# Patient Record
Sex: Female | Born: 1960 | Race: White | Hispanic: No | Smoking: Former smoker
Health system: Southern US, Community
[De-identification: ages and names within clinical notes are randomized; demographics above are authoritative.]

## PROBLEM LIST (undated history)

## (undated) DIAGNOSIS — E785 Hyperlipidemia, unspecified: Secondary | ICD-10-CM

## (undated) DIAGNOSIS — N289 Disorder of kidney and ureter, unspecified: Secondary | ICD-10-CM

## (undated) DIAGNOSIS — E119 Type 2 diabetes mellitus without complications: Secondary | ICD-10-CM

## (undated) DIAGNOSIS — I10 Essential (primary) hypertension: Secondary | ICD-10-CM

## (undated) DIAGNOSIS — I509 Heart failure, unspecified: Secondary | ICD-10-CM

## (undated) DIAGNOSIS — I219 Acute myocardial infarction, unspecified: Secondary | ICD-10-CM

## (undated) HISTORY — PX: TONSILLECTOMY: SUR1361

## (undated) HISTORY — DX: Essential (primary) hypertension: I10

## (undated) HISTORY — DX: Heart failure, unspecified: I50.9

## (undated) HISTORY — DX: Type 2 diabetes mellitus without complications: E11.9

## (undated) HISTORY — DX: Disorder of kidney and ureter, unspecified: N28.9

## (undated) HISTORY — DX: Acute myocardial infarction, unspecified: I21.9

## (undated) HISTORY — DX: Hyperlipidemia, unspecified: E78.5

## (undated) NOTE — *Deleted (*Deleted)
PROGRESS NOTE    ANEESHA HOLLORAN  ZOX:[id number] DOB: 1960-08-30 DOA: 11/15/2019 PCP: Patient, No Pcp Per    Chief Complaint  Patient presents with  . Fever  . DM ulcer    Brief Narrative: 74 year old lady with prior history of coronary artery disease s/p CABG, diabetes mellitus, hypertension, hyperlipidemia presents to ED for generalized weakness, fevers and chills patient reports that she lives at section 8 housing in [place], apparently lost her housing and moved to [place] she was currently living in a motel prior to admission.  On arrival to ED she was found to have a left great toe wound progressively getting worse with increased pain and foul-smelling drainage.  Imaging of the toe showed osteomyelitis.  Orthopedic surgery comes consulted underwent toe amputation on 11/17/2019.   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  Intraoperative cultures showing MSSA. Blood cultures have been negative.  Complete the course of oral antibiotics.    Type 2 diabetes mellitus CBG (last 3)  Recent Labs    11/24/19 2143 11/25/19 0716 11/25/19 1210  GLUCAP 122* 199* 186*   CBGs are well controlled.  Continue with Lantus and sliding scale insulin.  No changes.    Pseudohyponatremia probably secondary to elevated CBGs.    Hyperlipidemia Continue with the Lipitor  Hypertension Continue with metoprolol   Presumed chronic diastolic heart failure Patient appears to be euvolemic.      Stage IIIa CKD Creatinine appears to be at baseline.    Moderate malnutrition Nutrition consulted.  Hypomagnesemia Replaced    Disposition Patient has out-of-state Medicaid and currently no long-term living situation at this time PT evaluation recommending SNF.  Currently no safe discharge/ disposition found.   TOC on board and looking for placement.  DVT prophylaxis: (Lovenox Code Status: DNR Family Communication: (None at bedside Disposition:   Status is: Inpatient  Remains inpatient appropriate because:Unsafe d/c plan   Dispo: The patient is from: Home              Anticipated d/c is to: Pending              Anticipated d/c date is: 1 day              Patient currently is medically stable to d/c.       Consultants:   Orthopedics   Procedures:    Antimicrobials: none.    Subjective: No new complaints  Objective: Vitals:   11/24/19 1358 11/24/19 2146 11/25/19 0527 11/25/19 1212  BP: 133/75 (!) 158/93 122/82 107/66  Pulse: 78 83 76 64  Resp: 20 16 16 16   Temp: 98.1 F (36.7 C)  98.4 F (36.9 C) 97.9 F (36.6 C)  TempSrc: Oral  Oral Oral  SpO2: 100% 100% 100% 96%  Weight:      Height:        Intake/Output Summary (Last 24 hours) at 11/25/2019 1535 Last data filed at 11/25/2019 1000 Gross per 24 hour  Intake 600 ml  Output -  Net 600  ml   Filed Weights   11/15/19 1755 11/16/19 0025  Weight: 81.6 kg 81.7 kg    Examination:  General exam: Appears calm and comfortable  Respiratory system: Clear to auscultation. Respiratory effort normal. Cardiovascular system: S1 & S2 heard, RRR. No JVD, No pedal edema. Gastrointestinal system: Abdomen is nondistended, soft and nontender.t. Normal bowel sounds heard. Central nervous system: Alert and oriented. No focal neurological deficits. Extremities: RIGHT TOE AMPUTATION, BANDAGED.  Skin: No rashes, lesions or ulcers Psychiatry:  Mood & affect appropriate.     Data Reviewed: I have personally  reviewed following labs and imaging studies  CBC: Recent Labs  Lab 11/19/19 0333 11/21/19 0331 11/22/19 0343  WBC 11.0* 10.7* 11.6*  HGB 9.5* 9.3* 9.8*  HCT 29.0* 29.2* 31.0*  MCV 95.7 96.7 100.0  PLT 357 362 405*    Basic Metabolic Panel: Recent Labs  Lab 11/19/19 0333 11/21/19 0331 11/22/19 0343  NA 133* 135 132*  K 5.1 4.6 4.7  CL 96* 98 97*  CO2 29 29 26   GLUCOSE 223* 172* 212*  BUN 25* 22 21  CREATININE 1.10* 1.02* 1.06*  CALCIUM 8.6* 9.2 9.1  MG  --  1.6* 1.8    GFR: Estimated Creatinine Clearance: 55.2 mL/min (A) (by C-G formula based on SCr of 1.06 mg/dL (H)).  Liver Function Tests: Recent Labs  Lab 11/19/19 0333 11/21/19 0331 11/22/19 0343  AST 14* 16 20  ALT 11 9 7   ALKPHOS 78 59 61  BILITOT 0.3 0.4 0.4  PROT 5.9* 6.3* 6.5  ALBUMIN 2.3* 2.4* 2.9*    CBG: Recent Labs  Lab 11/24/19 1157 11/24/19 1733 11/24/19 2143 11/25/19 0716 11/25/19 1210  GLUCAP 253* 147* 122* 199* 186*     Recent Results (from the past 240 hour(s))  Respiratory Panel by RT PCR (Flu A&B, Covid) - Nasopharyngeal Swab     Status: None   Collection Time: 11/15/19  4:33 PM   Specimen: Nasopharyngeal Swab  Result Value Ref Range Status   SARS Coronavirus 2 by RT PCR NEGATIVE NEGATIVE Final    Comment: (NOTE) SARS-CoV-2 target nucleic acids are NOT DETECTED.  The SARS-CoV-2 RNA is generally detectable in upper respiratoy specimens during the acute phase of infection. The lowest concentration of SARS-CoV-2 viral copies this assay can detect is 131 copies/mL. A negative result does not preclude SARS-Cov-2 infection and should not be used as the sole basis for treatment or other patient management decisions. A negative result may occur with  improper specimen collection/handling, submission of specimen other than nasopharyngeal swab, presence of viral mutation(s) within the areas targeted by this assay, and inadequate number of viral copies (<131 copies/mL). A negative  result must be combined with clinical observations, patient history, and epidemiological information. The expected result is Negative.  Fact Sheet for Patients:  https://www.moore.com/  Fact Sheet for Healthcare Providers:  https://www.young.biz/  This test is no t yet approved or cleared by the [place] FDA and  has been authorized for detection and/or diagnosis of SARS-CoV-2 by FDA under an Emergency Use Authorization (EUA). This EUA will remain  in effect (meaning this test can be used) for the duration of the COVID-19 declaration under Section 564(b)(1) of the Act, 21 U.S.C. section 360bbb-3(b)(1), unless the authorization is terminated or revoked sooner.     Influenza A by PCR NEGATIVE NEGATIVE Final   Influenza B by PCR NEGATIVE NEGATIVE Final    Comment: (NOTE) The Xpert Xpress SARS-CoV-2/FLU/RSV assay is intended as an aid in  the diagnosis of influenza from Nasopharyngeal swab specimens and  should not be used as a sole basis for treatment. Nasal washings and  aspirates are unacceptable for Xpert Xpress SARS-CoV-2/FLU/RSV  testing.  Fact Sheet for Patients: https://www.moore.com/  Fact Sheet for Healthcare Providers: https://www.young.biz/  This test is not yet approved or cleared by the [place] FDA and  has been authorized for detection and/or diagnosis of SARS-CoV-2 by  FDA under an Emergency Use Authorization (EUA). This EUA will remain  in effect (meaning this test can be used) for the duration of the  Covid-19 declaration under Section 564(b)(1) of the Act, 21  U.S.C. section 360bbb-3(b)(1), unless the authorization is  terminated or revoked. Performed at [facility], [address], [place], [place] [address]   Culture, blood (routine x 2)     Status: None   Collection Time: 11/15/19  4:45 PM   Specimen: BLOOD  Result Value Ref Range Status    Specimen Description   Final    BLOOD SITE NOT SPECIFIED Performed at [facility], [address], [place], [place] [address]    Special Requests   Final    BOTTLES DRAWN AEROBIC AND ANAEROBIC Blood Culture adequate volume Performed at [facility], [address], [place], [place] [address]    Culture   Final    NO GROWTH 5 DAYS Performed at [facility], [address] [place], [place] [address]    Report Status 11/20/2019 FINAL  Final  Wound or Superficial Culture     Status: None   Collection Time: 11/15/19  4:56 PM   Specimen: Toe; Wound  Result Value Ref Range Status   Specimen Description   Final    TOE LEFT Performed at [facility], [address], [place], [place] [address]    Special Requests   Final    NONE Performed at [facility], [address] [place], [place] [address]    Gram Stain   Final    RARE WBC PRESENT, PREDOMINANTLY PMN FEW GRAM POSITIVE COCCI Performed at [facility], [address] [place], [place] [address]    Culture ABUNDANT STAPHYLOCOCCUS AUREUS  Final   Report Status 11/18/2019 FINAL  Final   Organism ID, Bacteria STAPHYLOCOCCUS AUREUS  Final      Susceptibility   Staphylococcus aureus - MIC*    CIPROFLOXACIN <=0.5 SENSITIVE Sensitive     ERYTHROMYCIN <=0.25 SENSITIVE Sensitive     GENTAMICIN <=0.5 SENSITIVE Sensitive     OXACILLIN <=0.25 SENSITIVE Sensitive     TETRACYCLINE <=1 SENSITIVE Sensitive     VANCOMYCIN <=0.5 SENSITIVE Sensitive     TRIMETH/SULFA <=10 SENSITIVE Sensitive     CLINDAMYCIN >=8 RESISTANT Resistant     RIFAMPIN <=0.5 SENSITIVE Sensitive     Inducible Clindamycin NEGATIVE Sensitive     * ABUNDANT STAPHYLOCOCCUS AUREUS  Aerobic/Anaerobic Culture (surgical/deep wound)     Status: None   Collection Time: 11/17/19  3:39 PM   Specimen: Soft Tissue, Other  Result Value Ref Range Status   Specimen Description   Final    TOE LEFT GREAT TOE  Performed at [facility], [address], [place], [place] [address]    Special Requests   Final    NONE Performed at [facility], [address] [place], [place] [address]    Gram Stain   Final    FEW WBC PRESENT, PREDOMINANTLY PMN RARE  GRAM POSITIVE COCCI    Culture   Final    FEW STAPHYLOCOCCUS AUREUS NO ANAEROBES ISOLATED Performed at [facility], [address] [place], [place] [address]    Report Status 11/22/2019 FINAL  Final   Organism ID, Bacteria STAPHYLOCOCCUS AUREUS  Final      Susceptibility   Staphylococcus aureus - MIC*    CIPROFLOXACIN <=0.5 SENSITIVE Sensitive     ERYTHROMYCIN <=0.25 SENSITIVE Sensitive     GENTAMICIN <=0.5 SENSITIVE Sensitive     OXACILLIN <=0.25 SENSITIVE Sensitive     TETRACYCLINE <=1 SENSITIVE Sensitive     VANCOMYCIN <=0.5 SENSITIVE Sensitive     TRIMETH/SULFA <=10 SENSITIVE Sensitive     CLINDAMYCIN >=8 RESISTANT Resistant     RIFAMPIN <=0.5 SENSITIVE Sensitive     Inducible Clindamycin NEGATIVE Sensitive     * FEW STAPHYLOCOCCUS AUREUS  Respiratory Panel by RT PCR (Flu A&B, Covid) - Nasopharyngeal Swab     Status: None   Collection Time: 11/22/19  6:05 PM   Specimen: Nasopharyngeal Swab  Result Value Ref Range Status   SARS Coronavirus 2 by RT PCR NEGATIVE NEGATIVE Final    Comment: (NOTE) SARS-CoV-2 target nucleic acids are NOT DETECTED.  The SARS-CoV-2 RNA is generally detectable in upper respiratoy specimens during the acute phase of infection. The lowest concentration of SARS-CoV-2 viral copies this assay can detect is 131 copies/mL. A negative result does not preclude SARS-Cov-2 infection and should not be used as the sole basis for treatment or other patient management decisions. A negative result may occur with  improper specimen collection/handling, submission of specimen other than nasopharyngeal swab, presence of viral mutation(s) within the areas targeted by this assay,  and inadequate number of viral copies (<131 copies/mL). A negative result must be combined with clinical observations, patient history, and epidemiological information. The expected result is Negative.  Fact Sheet for Patients:  https://www.moore.com/  Fact Sheet for Healthcare Providers:  https://www.young.biz/  This test is no t yet approved or cleared by the [place] FDA and  has been authorized for detection and/or diagnosis of SARS-CoV-2 by FDA under an Emergency Use Authorization (EUA). This EUA will remain  in effect (meaning this test can be used) for the duration of the COVID-19 declaration under Section 564(b)(1) of the Act, 21 U.S.C. section 360bbb-3(b)(1), unless the authorization is terminated or revoked sooner.     Influenza A by PCR NEGATIVE NEGATIVE Final   Influenza B by PCR NEGATIVE NEGATIVE Final    Comment: (NOTE) The Xpert Xpress SARS-CoV-2/FLU/RSV assay is intended as an aid in  the diagnosis of influenza from Nasopharyngeal swab specimens and  should not be used as a sole basis for treatment. Nasal washings and  aspirates are unacceptable for Xpert Xpress SARS-CoV-2/FLU/RSV  testing.  Fact Sheet for Patients: https://www.moore.com/  Fact Sheet for Healthcare Providers: https://www.young.biz/  This test is not yet approved or cleared by the [place] FDA and  has been authorized for detection and/or diagnosis of SARS-CoV-2 by  FDA under an Emergency Use Authorization (EUA). This EUA will remain  in effect (meaning this test can be used) for the duration of the  Covid-19 declaration under Section 564(b)(1) of the Act, 21  U.S.C. section 360bbb-3(b)(1), unless the authorization is  terminated or revoked. Performed at [facility], [address] [place], [place] [address]          Radiology Studies: No results found.      Scheduled  Meds: .  aspirin EC  81 mg Oral Daily  . atorvastatin  40 mg Oral Daily  . cephALEXin  500 mg Oral Q6H  . enoxaparin (LOVENOX) injection  40 mg Subcutaneous Q24H  . feeding supplement (GLUCERNA SHAKE)  237 mL Oral TID BM  . fenofibrate  54 mg Oral Daily  . gabapentin  800 mg Oral TID  . hydrocortisone   Rectal QID  . insulin aspart  0-15 Units Subcutaneous TID WC  . insulin aspart  0-5 Units Subcutaneous QHS  . insulin aspart  5 Units Subcutaneous TID WC  . insulin glargine  50 Units Subcutaneous QHS  . lamoTRIgine  100 mg Oral BID  . metFORMIN  500 mg Oral BID WC  . metoprolol tartrate  50 mg Oral Daily  . multivitamin with minerals  1 tablet Oral Daily   Continuous Infusions:   LOS: 10 days        Kathlen Mody, MD [facility]   To contact the attending provider between 7A-7P or the covering provider during after hours 7P-7A, please log into the web site [email or web address] and access using universal [place] password for that web site. If you do not have the password, please call the hospital operator.  11/25/2019, 3:35 PM

## (undated) NOTE — *Deleted (*Deleted)
Inpatient Diabetes Program Recommendations  AACE/ADA: New Consensus Statement on Inpatient Glycemic Control (2015)  Target Ranges:  Prepandial:   less than 140 mg/dL      Peak postprandial:   less than 180 mg/dL (1-2 hours)      Critically ill patients:  140 - 180 mg/dL   Lab Results  Component Value Date   GLUCAP 253 (H) 11/24/2019   HGBA1C 14.7 (H) 11/15/2019    Review of Glycemic Control  Diabetes history: *** Outpatient Diabetes medications: *** Current orders for Inpatient glycemic control: ***  Inpatient Diabetes Program Recommendations:

---

## 2007-05-06 ENCOUNTER — Emergency Department (HOSPITAL_COMMUNITY): Admission: EM | Admit: 2007-05-06 | Discharge: 2007-05-06 | Payer: Self-pay | Admitting: Emergency Medicine

## 2009-11-30 ENCOUNTER — Emergency Department (HOSPITAL_COMMUNITY): Admission: EM | Admit: 2009-11-30 | Discharge: 2009-11-30 | Payer: Self-pay | Admitting: Emergency Medicine

## 2010-03-18 LAB — GLUCOSE, CAPILLARY: Glucose-Capillary: 240 mg/dL — ABNORMAL HIGH (ref 70–99)

## 2015-10-30 LAB — HEMOGLOBIN A1C: Hemoglobin A1C: 8.1

## 2015-12-18 ENCOUNTER — Ambulatory Visit (INDEPENDENT_AMBULATORY_CARE_PROVIDER_SITE_OTHER): Payer: Self-pay | Admitting: "Endocrinology

## 2015-12-18 ENCOUNTER — Encounter: Payer: Self-pay | Admitting: "Endocrinology

## 2015-12-18 VITALS — BP 110/75 | HR 81 | Ht 60.0 in | Wt 176.0 lb

## 2015-12-18 DIAGNOSIS — E7800 Pure hypercholesterolemia, unspecified: Secondary | ICD-10-CM

## 2015-12-18 DIAGNOSIS — E1165 Type 2 diabetes mellitus with hyperglycemia: Secondary | ICD-10-CM

## 2015-12-18 DIAGNOSIS — N182 Chronic kidney disease, stage 2 (mild): Secondary | ICD-10-CM

## 2015-12-18 DIAGNOSIS — Z794 Long term (current) use of insulin: Secondary | ICD-10-CM

## 2015-12-18 DIAGNOSIS — E1159 Type 2 diabetes mellitus with other circulatory complications: Secondary | ICD-10-CM

## 2015-12-18 DIAGNOSIS — I10 Essential (primary) hypertension: Secondary | ICD-10-CM

## 2015-12-18 DIAGNOSIS — E1122 Type 2 diabetes mellitus with diabetic chronic kidney disease: Secondary | ICD-10-CM

## 2015-12-18 DIAGNOSIS — IMO0002 Reserved for concepts with insufficient information to code with codable children: Secondary | ICD-10-CM | POA: Insufficient documentation

## 2015-12-18 MED ORDER — METFORMIN HCL 500 MG PO TABS: 500.0000 mg | ORAL_TABLET | Freq: Two times a day (BID) | ORAL | 2 refills | Status: DC

## 2015-12-18 NOTE — Patient Instructions (Signed)

## 2015-12-18 NOTE — Progress Notes (Signed)
Subjective:    Patient ID: Pamela Knight, female    DOB: 11/06/1960. Patient is being seen in consultation for management of diabetes requested by  Bronwen Betters, FNP  Past Medical History:  Diagnosis Date  . Diabetes mellitus, type II ([place])   . Heart attack   . Heart failure ([place])   . Hyperlipidemia   . Hypertension   . Kidney disease    Past Surgical History:  Procedure Laterality Date  . TONSILLECTOMY     Social History   Social History  . Marital status: Divorced    Spouse name: N/A  . Number of children: N/A  . Years of education: N/A   Social History Main Topics  . Smoking status: Former Research scientist (life sciences)  . Smokeless tobacco: Never Used  . Alcohol use No  . Drug use: No  . Sexual activity: Not Asked   Other Topics Concern  . None   Social History Narrative  . None   Outpatient Encounter Prescriptions as of 12/18/2015  Medication Sig  . aspirin EC 81 MG tablet Take 81 mg by mouth daily.  Marland Kitchen atorvastatin (LIPITOR) 40 MG tablet Take 40 mg by mouth daily.  . cholecalciferol (VITAMIN D) 1000 units tablet Take 1,000 Units by mouth daily.  . citalopram (CELEXA) 20 MG tablet Take 20 mg by mouth daily.  . clopidogrel (PLAVIX) 75 MG tablet Take 75 mg by mouth daily.  . fenofibrate 160 MG tablet Take 160 mg by mouth daily.  Marland Kitchen gabapentin (NEURONTIN) 800 MG tablet Take 800 mg by mouth 3 (three) times daily.  . hydrOXYzine (VISTARIL) 25 MG capsule Take 25 mg by mouth 3 (three) times daily as needed.  . insulin aspart (NOVOLOG) 100 UNIT/ML injection Inject 10-16 Units into the skin 3 (three) times daily with meals.  . insulin glargine (LANTUS) 100 UNIT/ML injection Inject 60 Units into the skin at bedtime.  Marland Kitchen lisinopril (PRINIVIL,ZESTRIL) 10 MG tablet Take 10 mg by mouth daily.  . metFORMIN (GLUCOPHAGE) 500 MG tablet Take 1 tablet (500 mg total) by mouth 2 (two) times daily with a meal.  . Multiple Vitamins-Minerals (CENTRUM SILVER PO) Take by mouth.  . sertraline  (ZOLOFT) 25 MG tablet Take 25 mg by mouth daily.  . sitaGLIPtin (JANUVIA) 50 MG tablet Take 50 mg by mouth daily.  . vitamin B-12 (CYANOCOBALAMIN) 1000 MCG tablet Take 1,000 mcg by mouth daily.  . [DISCONTINUED] metFORMIN (GLUCOPHAGE) 1000 MG tablet Take 1,000 mg by mouth 2 (two) times daily with a meal.   No facility-administered encounter medications on file as of 12/18/2015.    ALLERGIES: Allergies  Allergen Reactions  . Avandia [Rosiglitazone]   . Niacin And Related    VACCINATION STATUS:  There is no immunization history on file for this patient.  Diabetes  She presents for her initial diabetic visit. She has type 2 diabetes mellitus. Onset time: She was diagnosed at approximate age of 55 years. Her disease course has been worsening. There are no hypoglycemic associated symptoms. Pertinent negatives for hypoglycemia include no confusion, headaches, pallor or seizures. Associated symptoms include blurred vision, fatigue, polydipsia and polyuria. Pertinent negatives for diabetes include no chest pain and no polyphagia. There are no hypoglycemic complications. Symptoms are worsening. Diabetic complications include heart disease, nephropathy, peripheral neuropathy and retinopathy. Risk factors for coronary artery disease include diabetes mellitus, dyslipidemia, hypertension, obesity, sedentary lifestyle and tobacco exposure. Current diabetic treatments: She is on Lantus 118 units daily at bedtime, Humalog 10 units 3 times a  day before meals, metformin 1000 mg by mouth twice a day, Januvia 50 g by mouth daily. Her weight is decreasing steadily. She is following a generally unhealthy diet. When asked about meal planning, she reported none. She has not had a previous visit with a dietitian. She never participates in exercise. Home blood sugar record trend: She did not bring any meter nor logs to review today. An ACE inhibitor/angiotensin II receptor blocker is being taken. Eye exam is current.   Hyperlipidemia  This is a chronic problem. The current episode started more than 1 year ago. The problem is uncontrolled. Recent lipid tests were reviewed and are high. Exacerbating diseases include diabetes and obesity. Pertinent negatives include no chest pain, myalgias or shortness of breath. Current antihyperlipidemic treatment includes statins. Risk factors for coronary artery disease include dyslipidemia, diabetes mellitus, obesity, hypertension and a sedentary lifestyle.  Hypertension  This is a chronic problem. The current episode started more than 1 year ago. The problem is controlled. Associated symptoms include blurred vision. Pertinent negatives include no chest pain, headaches, palpitations or shortness of breath. Risk factors for coronary artery disease include dyslipidemia, diabetes mellitus, obesity, sedentary lifestyle and smoking/tobacco exposure. Past treatments include ACE inhibitors. Hypertensive end-organ damage includes retinopathy.       Review of Systems  Constitutional: Positive for fatigue. Negative for chills, fever and unexpected weight change.  HENT: Negative for trouble swallowing and voice change.   Eyes: Positive for blurred vision. Negative for visual disturbance.  Respiratory: Negative for cough, shortness of breath and wheezing.   Cardiovascular: Negative for chest pain, palpitations and leg swelling.  Gastrointestinal: Negative for diarrhea, nausea and vomiting.  Endocrine: Positive for polydipsia and polyuria. Negative for cold intolerance, heat intolerance and polyphagia.  Musculoskeletal: Negative for arthralgias and myalgias.  Skin: Negative for color change, pallor, rash and wound.  Neurological: Negative for seizures and headaches.  Psychiatric/Behavioral: Negative for confusion and suicidal ideas.    Objective:    BP 110/75   Pulse 81   Ht 5' (1.524 m)   Wt 176 lb (79.8 kg)   BMI 34.37 kg/m   Wt Readings from Last 3 Encounters:  12/18/15  176 lb (79.8 kg)    Physical Exam  Constitutional: She is oriented to person, place, and time. She appears well-developed.  HENT:  Head: Normocephalic and atraumatic.  Eyes: EOM are normal.  Neck: Normal range of motion. Neck supple. No tracheal deviation present. No thyromegaly present.  Cardiovascular: Normal rate and regular rhythm.   Pulmonary/Chest: Effort normal and breath sounds normal.  Abdominal: Soft. Bowel sounds are normal. There is no tenderness. There is no guarding.  Musculoskeletal: Normal range of motion. She exhibits no edema.  Neurological: She is alert and oriented to person, place, and time. She has normal reflexes. No cranial nerve deficit. Coordination normal.  Skin: Skin is warm and dry. No rash noted. No erythema. No pallor.  Psychiatric: She has a normal mood and affect. Judgment normal.    A1c from October 20 05/31/2015 was 8.1%  Assessment & Plan:   1. Uncontrolled type 2 diabetes mellitus with stage 2 chronic kidney disease, Coronary artery disease status post stent placement with long-term current use of insulin ([place])   - Patient has currently uncontrolled symptomatic type 2 DM since  55 years of age,  with most recent A1c of 8.1 %. Recent labs reviewed.   Her diabetes is complicated by coronary artery disease, chronic knee disease, and patient remains at a high risk  for more acute and chronic complications of diabetes which include CAD, CVA, CKD, retinopathy, and neuropathy. These are all discussed in detail with the patient.  - I have counseled the patient on diet management and weight loss, by adopting a carbohydrate restricted/protein rich diet.  - Suggestion is made for patient to avoid simple carbohydrates   from their diet including Cakes , Desserts, Ice Cream,  Soda (  diet and regular) , Sweet Tea , Candies,  Chips, Cookies, Artificial Sweeteners,   and "Sugar-free" Products . This will help patient to have stable blood glucose profile and  potentially avoid unintended weight gain.  - I encouraged the patient to switch to  unprocessed or minimally processed complex starch and increased protein intake (animal or plant source), fruits, and vegetables.  - Patient is advised to stick to a routine mealtimes to eat 3 meals  a day and avoid unnecessary snacks ( to snack only to correct hypoglycemia).  - The patient will be scheduled with Jearld Fenton, RDN, CDE for individualized DM education.  - I have approached patient with the following individualized plan to manage diabetes and patient agrees:   - I  will proceed to adjust her basal insulin Lantus 60 units daily at bedtime, her prandial insulin to 10 units 3 times a day before meals for pre-meal BG readings of 90-150mg /dl, plus patient specific correction dose for unexpected hyperglycemia above 150mg /dl, associated with strict monitoring of glucose  AC and HS. - Patient is warned not to take insulin without proper monitoring per orders. -Adjustment parameters are given for hypo and hyperglycemia in writing. -Patient is encouraged to call clinic for blood glucose levels less than 70 or above 300 mg /dl. - I will  lower her metformin to 500 mg by mouth twice a day due to renal insufficiency, still therapeutically suitable for patient. - I advised her to continue Januvia 50 mg by mouth daily.  -Patient is not a candidate for  SGLT2 i due to CKD.  - Patient will be considered for incretin therapy as appropriate next visit. - Patient specific target  A1c;  LDL, HDL, Triglycerides, and  Waist Circumference were discussed in detail.  2) BP/HTN: Controlled. Continue current medications including ACEI/ARB. 3) Lipids/HPL:  Uncontrolled, continue statins. 4)  Weight/Diet: CDE Consult will be initiated , exercise, and detailed carbohydrates information provided.  5) Chronic Care/Health Maintenance:  -Patient is on ACEI/ARB and Statin medications and encouraged to continue to follow up  with Ophthalmology, Podiatrist at least yearly or according to recommendations, and advised to   stay away from smoking. I have recommended yearly flu vaccine and pneumonia vaccination at least every 5 years; moderate intensity exercise for up to 150 minutes weekly; and  sleep for at least 7 hours a day.  - 60 minutes of time was spent on the care of this patient , 50% of which was applied for counseling on diabetes complications and their preventions.  - Patient to bring meter and  blood glucose logs during her next visit.   - I advised patient to maintain close follow up with Bronwen Betters, FNP for primary care needs.  Follow up plan: - Return in about 1 week (around 12/25/2015) for follow up with meter and logs- no labs.  Glade Lloyd, MD Phone: [phone]  Fax: [phone]   12/18/2015, 4:20 PM

## 2015-12-26 ENCOUNTER — Ambulatory Visit (INDEPENDENT_AMBULATORY_CARE_PROVIDER_SITE_OTHER): Payer: Medicaid - Out of State | Admitting: "Endocrinology

## 2015-12-26 ENCOUNTER — Encounter: Payer: Medicaid - Out of State | Attending: "Endocrinology | Admitting: Nutrition

## 2015-12-26 ENCOUNTER — Encounter: Payer: Self-pay | Admitting: "Endocrinology

## 2015-12-26 VITALS — Wt 179.0 lb

## 2015-12-26 VITALS — BP 133/88 | HR 88 | Ht 60.0 in | Wt 179.0 lb

## 2015-12-26 DIAGNOSIS — I10 Essential (primary) hypertension: Secondary | ICD-10-CM

## 2015-12-26 DIAGNOSIS — E1159 Type 2 diabetes mellitus with other circulatory complications: Secondary | ICD-10-CM

## 2015-12-26 DIAGNOSIS — E7800 Pure hypercholesterolemia, unspecified: Secondary | ICD-10-CM | POA: Diagnosis not present

## 2015-12-26 DIAGNOSIS — E088 Diabetes mellitus due to underlying condition with unspecified complications: Secondary | ICD-10-CM

## 2015-12-26 DIAGNOSIS — Z713 Dietary counseling and surveillance: Secondary | ICD-10-CM | POA: Diagnosis not present

## 2015-12-26 DIAGNOSIS — N182 Chronic kidney disease, stage 2 (mild): Secondary | ICD-10-CM

## 2015-12-26 DIAGNOSIS — E1165 Type 2 diabetes mellitus with hyperglycemia: Secondary | ICD-10-CM

## 2015-12-26 DIAGNOSIS — Z9119 Patient's noncompliance with other medical treatment and regimen: Secondary | ICD-10-CM

## 2015-12-26 DIAGNOSIS — Z794 Long term (current) use of insulin: Secondary | ICD-10-CM | POA: Insufficient documentation

## 2015-12-26 DIAGNOSIS — E1122 Type 2 diabetes mellitus with diabetic chronic kidney disease: Secondary | ICD-10-CM | POA: Diagnosis not present

## 2015-12-26 DIAGNOSIS — E0865 Diabetes mellitus due to underlying condition with hyperglycemia: Secondary | ICD-10-CM

## 2015-12-26 DIAGNOSIS — E669 Obesity, unspecified: Secondary | ICD-10-CM

## 2015-12-26 DIAGNOSIS — IMO0002 Reserved for concepts with insufficient information to code with codable children: Secondary | ICD-10-CM

## 2015-12-26 DIAGNOSIS — Z91199 Patient's noncompliance with other medical treatment and regimen due to unspecified reason: Secondary | ICD-10-CM

## 2015-12-26 NOTE — Progress Notes (Signed)
Subjective:    Patient ID: INIKI BOUGH, female    DOB: 11-Sep-1960. Patient is being seen in f/u for management of diabetes requested by  Bronwen Betters, FNP  Past Medical History:  Diagnosis Date  . Diabetes mellitus, type II ([place])   . Heart attack   . Heart failure ([place])   . Hyperlipidemia   . Hypertension   . Kidney disease    Past Surgical History:  Procedure Laterality Date  . TONSILLECTOMY     Social History   Social History  . Marital status: Divorced    Spouse name: N/A  . Number of children: N/A  . Years of education: N/A   Social History Main Topics  . Smoking status: Former Research scientist (life sciences)  . Smokeless tobacco: Never Used  . Alcohol use No  . Drug use: No  . Sexual activity: Not on file   Other Topics Concern  . Not on file   Social History Narrative  . No narrative on file   Outpatient Encounter Prescriptions as of 12/26/2015  Medication Sig  . aspirin EC 81 MG tablet Take 81 mg by mouth daily.  Marland Kitchen atorvastatin (LIPITOR) 40 MG tablet Take 40 mg by mouth daily.  . cholecalciferol (VITAMIN D) 1000 units tablet Take 1,000 Units by mouth daily.  . citalopram (CELEXA) 20 MG tablet Take 20 mg by mouth daily.  . clopidogrel (PLAVIX) 75 MG tablet Take 75 mg by mouth daily.  . fenofibrate 160 MG tablet Take 160 mg by mouth daily.  Marland Kitchen gabapentin (NEURONTIN) 800 MG tablet Take 800 mg by mouth 3 (three) times daily.  . hydrOXYzine (VISTARIL) 25 MG capsule Take 25 mg by mouth 3 (three) times daily as needed.  . insulin aspart (NOVOLOG) 100 UNIT/ML injection Inject 10-16 Units into the skin 3 (three) times daily with meals.  . insulin glargine (LANTUS) 100 UNIT/ML injection Inject 60 Units into the skin at bedtime.  Marland Kitchen lisinopril (PRINIVIL,ZESTRIL) 10 MG tablet Take 10 mg by mouth daily.  . metFORMIN (GLUCOPHAGE) 500 MG tablet Take 1 tablet (500 mg total) by mouth 2 (two) times daily with a meal.  . Multiple Vitamins-Minerals (CENTRUM SILVER PO) Take by mouth.  .  sertraline (ZOLOFT) 25 MG tablet Take 25 mg by mouth daily.  . sitaGLIPtin (JANUVIA) 50 MG tablet Take 50 mg by mouth daily.  . vitamin B-12 (CYANOCOBALAMIN) 1000 MCG tablet Take 1,000 mcg by mouth daily.   No facility-administered encounter medications on file as of 12/26/2015.    ALLERGIES: Allergies  Allergen Reactions  . Avandia [Rosiglitazone]   . Niacin And Related    VACCINATION STATUS:  There is no immunization history on file for this patient.  Diabetes  She presents for her follow-up diabetic visit. She has type 2 diabetes mellitus. Onset time: She was diagnosed at approximate age of 76 years. Her disease course has been worsening. There are no hypoglycemic associated symptoms. Pertinent negatives for hypoglycemia include no confusion, headaches, pallor or seizures. Associated symptoms include blurred vision, fatigue, polydipsia and polyuria. Pertinent negatives for diabetes include no chest pain and no polyphagia. There are no hypoglycemic complications. Symptoms are worsening. Diabetic complications include heart disease, nephropathy, peripheral neuropathy and retinopathy. Risk factors for coronary artery disease include diabetes mellitus, dyslipidemia, hypertension, obesity, sedentary lifestyle and tobacco exposure. Current diabetic treatments: She is on Lantus 118 units daily at bedtime, Humalog 10 units 3 times a day before meals, metformin 1000 mg by mouth twice a day, Januvia 50 g  by mouth daily. Her weight is increasing steadily. She is following a generally unhealthy diet. When asked about meal planning, she reported none. She has not had a previous visit with a dietitian. She never participates in exercise. Home blood sugar record trend: She did bring her meter showing inadequate monitoring, and did not use her insulin properly. Her overall blood glucose range is >200 mg/dl. An ACE inhibitor/angiotensin II receptor blocker is being taken. Eye exam is current.  Hyperlipidemia   This is a chronic problem. The current episode started more than 1 year ago. The problem is uncontrolled. Recent lipid tests were reviewed and are high. Exacerbating diseases include diabetes and obesity. Pertinent negatives include no chest pain, myalgias or shortness of breath. Current antihyperlipidemic treatment includes statins. Risk factors for coronary artery disease include dyslipidemia, diabetes mellitus, obesity, hypertension and a sedentary lifestyle.  Hypertension  This is a chronic problem. The current episode started more than 1 year ago. The problem is controlled. Associated symptoms include blurred vision. Pertinent negatives include no chest pain, headaches, palpitations or shortness of breath. Risk factors for coronary artery disease include dyslipidemia, diabetes mellitus, obesity, sedentary lifestyle and smoking/tobacco exposure. Past treatments include ACE inhibitors. Hypertensive end-organ damage includes retinopathy.       Review of Systems  Constitutional: Positive for fatigue. Negative for chills, fever and unexpected weight change.  HENT: Negative for trouble swallowing and voice change.   Eyes: Positive for blurred vision. Negative for visual disturbance.  Respiratory: Negative for cough, shortness of breath and wheezing.   Cardiovascular: Negative for chest pain, palpitations and leg swelling.  Gastrointestinal: Negative for diarrhea, nausea and vomiting.  Endocrine: Positive for polydipsia and polyuria. Negative for cold intolerance, heat intolerance and polyphagia.  Musculoskeletal: Negative for arthralgias and myalgias.  Skin: Negative for color change, pallor, rash and wound.  Neurological: Negative for seizures and headaches.  Psychiatric/Behavioral: Negative for confusion and suicidal ideas.    Objective:    BP 133/88   Pulse 88   Ht 5' (1.524 m)   Wt 179 lb (81.2 kg)   BMI 34.96 kg/m   Wt Readings from Last 3 Encounters:  12/26/15 179 lb (81.2 kg)   12/18/15 176 lb (79.8 kg)    Physical Exam  Constitutional: She is oriented to person, place, and time. She appears well-developed.  HENT:  Head: Normocephalic and atraumatic.  Eyes: EOM are normal.  Neck: Normal range of motion. Neck supple. No tracheal deviation present. No thyromegaly present.  Cardiovascular: Normal rate and regular rhythm.   Pulmonary/Chest: Effort normal and breath sounds normal.  Abdominal: Soft. Bowel sounds are normal. There is no tenderness. There is no guarding.  Musculoskeletal: Normal range of motion. She exhibits no edema.  Neurological: She is alert and oriented to person, place, and time. She has normal reflexes. No cranial nerve deficit. Coordination normal.  Skin: Skin is warm and dry. No rash noted. No erythema. No pallor.  Psychiatric: She has a normal mood and affect. Judgment normal.    A1c from October 20 05/31/2015 was 8.1%  Assessment & Plan:   1. Uncontrolled type 2 diabetes mellitus with stage 2 chronic kidney disease, Coronary artery disease status post stent placement with long-term current use of insulin ([place])   - Patient has currently uncontrolled symptomatic type 2 DM since  55 years of age,  with most recent A1c of 8.1 %. Recent labs reviewed.   Her diabetes is complicated by coronary artery disease, chronic knee disease, and patient remains  at a high risk for more acute and chronic complications of diabetes which include CAD, CVA, CKD, retinopathy, and neuropathy. These are all discussed in detail with the patient.  - I have counseled the patient on diet management and weight loss, by adopting a carbohydrate restricted/protein rich diet.  - Suggestion is made for patient to avoid simple carbohydrates   from their diet including Cakes , Desserts, Ice Cream,  Soda (  diet and regular) , Sweet Tea , Candies,  Chips, Cookies, Artificial Sweeteners,   and "Sugar-free" Products . This will help patient to have stable blood glucose profile  and potentially avoid unintended weight gain.  - I encouraged the patient to switch to  unprocessed or minimally processed complex starch and increased protein intake (animal or plant source), fruits, and vegetables.  - Patient is advised to stick to a routine mealtimes to eat 3 meals  a day and avoid unnecessary snacks ( to snack only to correct hypoglycemia).  - The patient will be scheduled with Jearld Fenton, RDN, CDE for individualized DM education.  - I have approached patient with the following individualized plan to manage diabetes and patient agrees:   - She has not followed the insulin regimen I gave her last visit. - I will proceed to lower her basal insulin Lantus to 50 units daily at bedtime, her prandial insulin Novolog to 10 units 3 times a day before meals for pre-meal BG readings of 90-150mg /dl, plus patient specific correction dose for unexpected hyperglycemia above 150mg /dl, associated with strict monitoring of glucose  AC and HS. - Patient is warned not to take insulin without proper monitoring per orders. -Adjustment parameters are given for hypo and hyperglycemia in writing. -Patient is encouraged to call clinic for blood glucose levels less than 70 or above 300 mg /dl. - I  lowered her metformin to 500 mg by mouth twice a day due to renal insufficiency, still therapeutically suitable for patient. - I advised her to continue Januvia 50 mg by mouth daily.  -Patient is not a candidate for  SGLT2 i due to CKD.  - Patient will be considered for incretin therapy as appropriate next visit. - Patient specific target  A1c;  LDL, HDL, Triglycerides, and  Waist Circumference were discussed in detail.  2) BP/HTN: Controlled. Continue current medications including ACEI/ARB. 3) Lipids/HPL:  Uncontrolled, continue statins. 4)  Weight/Diet: CDE Consult will be initiated , exercise, and detailed carbohydrates information provided.  5) Chronic Care/Health Maintenance:  -Patient is  on ACEI/ARB and Statin medications and encouraged to continue to follow up with Ophthalmology, Podiatrist at least yearly or according to recommendations, and advised to   stay away from smoking. I have recommended yearly flu vaccine and pneumonia vaccination at least every 5 years; moderate intensity exercise for up to 150 minutes weekly; and  sleep for at least 7 hours a day.  - 30 minutes of time was spent on the care of this patient , 50% of which was applied for counseling on diabetes complications and their preventions.  - Patient to bring meter and  blood glucose logs during her next visit.   - I advised patient to maintain close follow up with Bronwen Betters, FNP for primary care needs.  Follow up plan: - Return in about 2 weeks (around 01/09/2016) for follow up with meter and logs- no labs.  Glade Lloyd, MD Phone: [phone]  Fax: [phone]   12/26/2015, 9:16 AM

## 2015-12-26 NOTE — Progress Notes (Signed)
  Medical Nutrition Therapy:  Appt start time: 0915 end time:  0930.   Assessment:  Primary concerns today  Diabetes Type 2. Lattus 50 units a day and 10 units Humalog with meals plus sliding scale. . Walk in from Dr. Dorris Fetch office. Will answer safety questions at next visit.  She was on 60 units of Lantus daily and Dr. Dorris Fetch reduced it to 50 units today. BS log brought in was incomplete. Hasn't had any DM education in the past with a CDE/RDN.    Diet is inconsistent to meet her needs due to lack of knowledge. Diet is low in fresh fruits, whole grains and insuffient carbs at meals.   Lab Results  Component Value Date   HGBA1C 8.1 10/30/2015     Wt Readings from Last 3 Encounters:  12/26/15 179 lb (81.2 kg)  12/18/15 176 lb (79.8 kg)   Ht Readings from Last 3 Encounters:  12/26/15 5' (1.524 m)  12/18/15 5' (1.524 m)   There is no height or weight on file to calculate BMI.   Preferred Learning Style:      Auditory  Visual  Hands on  No preference indicated   Learning Readiness:     Ready  Change in progress   MEDICATIONS: See list   DIETARY INTAKE:  24-hr recall:  B ( AM): Eggs, OR bacon and egg sausage biscuit, OR oatmeal Snk ( AM): none  L ( PM): Brussel sprouts, milk Snk ( PM):  D ( PM): hamburger helper, green beans, baked beans, 8 oz meal Snk ( PM): popcorn Beverages: water, milk  Usual physical activity: ADL  Estimated energy needs: 1200-1500  calories 135 g carbohydrates 90 g protein  33 g fat  Progress Towards Goal(s):  In progress.   Nutritional Diagnosis:  Nutrition knowledge deficit related to diabetes type as evidenced by A1C > 8%.    Intervention:  Nutrition and Diabetes education provided on My Plate, CHO counting, meal planning, portion sizes, timing of meals, avoiding snacks between meals unless having a low blood sugar, target ranges for A1C and blood sugars, signs/symptoms and treatment of hyper/hypoglycemia, monitoring blood  sugars, taking medications as prescribed, benefits of exercising 30 minutes per day and prevention of complications of DM. Goals Follow My Plate Method Eat meals on time Take insulin as prescribed  Do not skip meals  Do not eat snacks between meals or after supper. Drink only water Walk 30 minute a day Record blood sugars before each meal and at bedtime and record of sheets. Bring meter and BS log at each visit.  Teaching Method Utilized:  Visual Auditory Hands on  Handouts given during visit include:  The Plate Method   Barriers to learning/adherence to lifestyle change: none  Demonstrated degree of understanding via:  Teach Back   Monitoring/Evaluation:  Dietary intake, exercise, meal planning, SBG, and body weight in 2 weeks month(s).

## 2015-12-26 NOTE — Patient Instructions (Signed)
Goals Follow My Plate Method Eat meals on time Take insulin as prescribed  Do not skip meals  Do not eat snacks between meals or after supper. Drink only water Walk 30 minute a day Record blood sugars before each meal and at bedtime and record of sheets. Bring meter and BS log at each visit.

## 2016-01-14 ENCOUNTER — Ambulatory Visit (INDEPENDENT_AMBULATORY_CARE_PROVIDER_SITE_OTHER): Payer: Medicaid Other | Admitting: "Endocrinology

## 2016-01-14 ENCOUNTER — Encounter: Payer: Self-pay | Admitting: "Endocrinology

## 2016-01-14 VITALS — BP 130/74 | HR 78 | Ht 60.0 in | Wt 179.0 lb

## 2016-01-14 DIAGNOSIS — E1159 Type 2 diabetes mellitus with other circulatory complications: Secondary | ICD-10-CM

## 2016-01-14 DIAGNOSIS — N182 Chronic kidney disease, stage 2 (mild): Secondary | ICD-10-CM

## 2016-01-14 DIAGNOSIS — Z9119 Patient's noncompliance with other medical treatment and regimen: Secondary | ICD-10-CM | POA: Diagnosis not present

## 2016-01-14 DIAGNOSIS — IMO0002 Reserved for concepts with insufficient information to code with codable children: Secondary | ICD-10-CM

## 2016-01-14 DIAGNOSIS — E7800 Pure hypercholesterolemia, unspecified: Secondary | ICD-10-CM | POA: Diagnosis not present

## 2016-01-14 DIAGNOSIS — I10 Essential (primary) hypertension: Secondary | ICD-10-CM

## 2016-01-14 DIAGNOSIS — E1122 Type 2 diabetes mellitus with diabetic chronic kidney disease: Secondary | ICD-10-CM | POA: Diagnosis not present

## 2016-01-14 DIAGNOSIS — E1165 Type 2 diabetes mellitus with hyperglycemia: Secondary | ICD-10-CM | POA: Diagnosis not present

## 2016-01-14 DIAGNOSIS — Z91199 Patient's noncompliance with other medical treatment and regimen due to unspecified reason: Secondary | ICD-10-CM

## 2016-01-14 DIAGNOSIS — Z794 Long term (current) use of insulin: Secondary | ICD-10-CM | POA: Diagnosis not present

## 2016-01-14 NOTE — Patient Instructions (Signed)

## 2016-01-14 NOTE — Progress Notes (Signed)
Subjective:    Patient ID: Pamela [facility], female    DOB: Mar 21, 1960. Patient is being seen in f/u for management of diabetes requested by  Bronwen Betters, FNP  Past Medical History:  Diagnosis Date  . Diabetes mellitus, type II ([place])   . Heart attack   . Heart failure ([place])   . Hyperlipidemia   . Hypertension   . Kidney disease    Past Surgical History:  Procedure Laterality Date  . TONSILLECTOMY     Social History   Social History  . Marital status: Divorced    Spouse name: N/A  . Number of children: N/A  . Years of education: N/A   Social History Main Topics  . Smoking status: Former Research scientist (life sciences)  . Smokeless tobacco: Never Used  . Alcohol use No  . Drug use: No  . Sexual activity: Not Asked   Other Topics Concern  . None   Social History Narrative  . None   Outpatient Encounter Prescriptions as of 01/14/2016  Medication Sig  . aspirin EC 81 MG tablet Take 81 mg by mouth daily.  Marland Kitchen atorvastatin (LIPITOR) 40 MG tablet Take 40 mg by mouth daily.  . cholecalciferol (VITAMIN D) 1000 units tablet Take 1,000 Units by mouth daily.  . citalopram (CELEXA) 20 MG tablet Take 20 mg by mouth daily.  . clopidogrel (PLAVIX) 75 MG tablet Take 75 mg by mouth daily.  . fenofibrate 160 MG tablet Take 160 mg by mouth daily.  Marland Kitchen gabapentin (NEURONTIN) 800 MG tablet Take 800 mg by mouth 3 (three) times daily.  . hydrOXYzine (VISTARIL) 25 MG capsule Take 25 mg by mouth 3 (three) times daily as needed.  . insulin aspart (NOVOLOG) 100 UNIT/ML injection Inject 10-16 Units into the skin 3 (three) times daily with meals.  . insulin glargine (LANTUS) 100 UNIT/ML injection Inject 60 Units into the skin at bedtime.  Marland Kitchen lisinopril (PRINIVIL,ZESTRIL) 10 MG tablet Take 10 mg by mouth daily.  . metFORMIN (GLUCOPHAGE) 500 MG tablet Take 1 tablet (500 mg total) by mouth 2 (two) times daily with a meal.  . Multiple Vitamins-Minerals (CENTRUM SILVER PO) Take by mouth.  . sertraline (ZOLOFT) 25 MG  tablet Take 25 mg by mouth daily.  . vitamin B-12 (CYANOCOBALAMIN) 1000 MCG tablet Take 1,000 mcg by mouth daily.  . [DISCONTINUED] sitaGLIPtin (JANUVIA) 50 MG tablet Take 50 mg by mouth daily.   No facility-administered encounter medications on file as of 01/14/2016.    ALLERGIES: Allergies  Allergen Reactions  . Avandia [Rosiglitazone]   . Niacin And Related    VACCINATION STATUS:  There is no immunization history on file for this patient.  Diabetes  She presents for her follow-up diabetic visit. She has type 2 diabetes mellitus. Onset time: She was diagnosed at approximate age of 17 years. Her disease course has been worsening. There are no hypoglycemic associated symptoms. Pertinent negatives for hypoglycemia include no confusion, headaches, pallor or seizures. Associated symptoms include blurred vision, fatigue, polydipsia and polyuria. Pertinent negatives for diabetes include no chest pain and no polyphagia. There are no hypoglycemic complications. Symptoms are worsening. Diabetic complications include heart disease, nephropathy, peripheral neuropathy and retinopathy. Risk factors for coronary artery disease include diabetes mellitus, dyslipidemia, hypertension, obesity, sedentary lifestyle and tobacco exposure. Current diabetic treatments: She is on Lantus 118 units daily at bedtime, Humalog 10 units 3 times a day before meals, metformin 1000 mg by mouth twice a day, Januvia 50 g by mouth daily. Her weight  is stable. She is following a generally unhealthy diet. When asked about meal planning, she reported none. She has not had a previous visit with a dietitian. She never participates in exercise. Her overall blood glucose range is >200 mg/dl. (She monitored only 1-2 times a day despite the advice for her to test 4 times a day. Her average blood glucose is 213.) An ACE inhibitor/angiotensin II receptor blocker is being taken. Eye exam is current.  Hyperlipidemia  This is a chronic problem.  The current episode started more than 1 year ago. The problem is uncontrolled. Recent lipid tests were reviewed and are high. Exacerbating diseases include diabetes and obesity. Pertinent negatives include no chest pain, myalgias or shortness of breath. Current antihyperlipidemic treatment includes statins. Risk factors for coronary artery disease include dyslipidemia, diabetes mellitus, obesity, hypertension and a sedentary lifestyle.  Hypertension  This is a chronic problem. The current episode started more than 1 year ago. The problem is controlled. Associated symptoms include blurred vision. Pertinent negatives include no chest pain, headaches, palpitations or shortness of breath. Risk factors for coronary artery disease include dyslipidemia, diabetes mellitus, obesity, sedentary lifestyle and smoking/tobacco exposure. Past treatments include ACE inhibitors. Hypertensive end-organ damage includes retinopathy.       Review of Systems  Constitutional: Positive for fatigue. Negative for chills, fever and unexpected weight change.  HENT: Negative for trouble swallowing and voice change.   Eyes: Positive for blurred vision. Negative for visual disturbance.  Respiratory: Negative for cough, shortness of breath and wheezing.   Cardiovascular: Negative for chest pain, palpitations and leg swelling.  Gastrointestinal: Negative for diarrhea, nausea and vomiting.  Endocrine: Positive for polydipsia and polyuria. Negative for cold intolerance, heat intolerance and polyphagia.  Musculoskeletal: Negative for arthralgias and myalgias.  Skin: Negative for color change, pallor, rash and wound.  Neurological: Negative for seizures and headaches.  Psychiatric/Behavioral: Negative for confusion and suicidal ideas.    Objective:    BP 130/74   Pulse 78   Ht 5' (1.524 m)   Wt 179 lb (81.2 kg)   BMI 34.96 kg/m   Wt Readings from Last 3 Encounters:  01/14/16 179 lb (81.2 kg)  12/26/15 179 lb (81.2 kg)   12/26/15 179 lb (81.2 kg)    Physical Exam  Constitutional: She is oriented to person, place, and time. She appears well-developed.  HENT:  Head: Normocephalic and atraumatic.  Eyes: EOM are normal.  Neck: Normal range of motion. Neck supple. No tracheal deviation present. No thyromegaly present.  Cardiovascular: Normal rate and regular rhythm.   Pulmonary/Chest: Effort normal and breath sounds normal.  Abdominal: Soft. Bowel sounds are normal. There is no tenderness. There is no guarding.  Musculoskeletal: Normal range of motion. She exhibits no edema.  Neurological: She is alert and oriented to person, place, and time. She has normal reflexes. No cranial nerve deficit. Coordination normal.  Skin: Skin is warm and dry. No rash noted. No erythema. No pallor.  Psychiatric: She has a normal mood and affect. Judgment normal.    A1c from October 20 05/31/2015 was 8.1%  Assessment & Plan:   1. Uncontrolled type 2 diabetes mellitus with stage 2 chronic kidney disease, Coronary artery disease status post stent placement with long-term current use of insulin ([place])   - Patient has currently uncontrolled symptomatic type 2 DM since  56 years of age. - She came with inadequate blood glucose profile which means she didn't use the NovoLog properly. Her most recent A1c of 8.1 %.  Her diabetes is complicated by coronary artery disease, chronic knee disease, and patient remains at a high risk for more acute and chronic complications of diabetes which include CAD, CVA, CKD, retinopathy, and neuropathy. These are all discussed in detail with the patient.  - I have counseled the patient on diet management and weight loss, by adopting a carbohydrate restricted/protein rich diet.  - Suggestion is made for patient to avoid simple carbohydrates   from their diet including Cakes , Desserts, Ice Cream,  Soda (  diet and regular) , Sweet Tea , Candies,  Chips, Cookies, Artificial Sweeteners,   and  "Sugar-free" Products . This will help patient to have stable blood glucose profile and potentially avoid unintended weight gain.  - I encouraged the patient to switch to  unprocessed or minimally processed complex starch and increased protein intake (animal or plant source), fruits, and vegetables.  - Patient is advised to stick to a routine mealtimes to eat 3 meals  a day and avoid unnecessary snacks ( to snack only to correct hypoglycemia).  - The patient will be scheduled with Jearld Fenton, RDN, CDE for individualized DM education.  - I have approached patient with the following individualized plan to manage diabetes and patient agrees:   -  I have re-counseled this patient for 20 minutes, to really engage in self-care with strict monitoring of blood glucose before meals and at bedtime.  - She has not followed the insulin regimen I gave her last visit. - I will proceed with Lantus 50 units daily at bedtime,  Novolog  10 units 3 times a day before meals for pre-meal BG readings of 90-150mg /dl, plus patient specific correction dose for unexpected hyperglycemia above 150mg /dl, associated with strict monitoring of glucose  AC and HS. - Patient is warned not to take insulin without proper monitoring per orders. -Adjustment parameters are given for hypo and hyperglycemia in writing. -Patient is encouraged to call clinic for blood glucose levels less than 70 or above 300 mg /dl. - I  lowered her metformin to 500 mg by mouth twice a day due to renal insufficiency, still therapeutically suitable for patient. - I advised her to finish Januvia 50 mg by mouth daily, and stop when she is done with her current supplies.   -Patient is not a candidate for  SGLT2 i due to CKD.  - Patient will be considered for incretin therapy as appropriate next visit. - Patient specific target  A1c;  LDL, HDL, Triglycerides, and  Waist Circumference were discussed in detail.  2) BP/HTN: Controlled. Continue current  medications including ACEI/ARB. 3) Lipids/HPL:  Uncontrolled, continue statins. 4)  Weight/Diet: CDE Consult will be initiated , exercise, and detailed carbohydrates information provided.  5) Chronic Care/Health Maintenance:  -Patient is on ACEI/ARB and Statin medications and encouraged to continue to follow up with Ophthalmology, Podiatrist at least yearly or according to recommendations, and advised to   stay away from smoking. I have recommended yearly flu vaccine and pneumonia vaccination at least every 5 years; moderate intensity exercise for up to 150 minutes weekly; and  sleep for at least 7 hours a day.  - 30 minutes of time was spent on the care of this patient , 50% of which was applied for counseling on diabetes complications and their preventions.  - Patient to bring meter and  blood glucose logs during her next visit.   - I advised patient to maintain close follow up with Bronwen Betters, FNP for primary care needs.  Follow up plan: - Return in about 7 weeks (around 03/03/2016) for follow up with pre-visit labs, meter, and logs.  Glade Lloyd, MD Phone: [phone]  Fax: [phone]   01/14/2016, 11:36 AM

## 2016-01-21 ENCOUNTER — Ambulatory Visit: Payer: Self-pay | Admitting: Nutrition

## 2016-01-30 ENCOUNTER — Ambulatory Visit: Payer: Medicaid - Out of State | Admitting: Nutrition

## 2016-03-03 ENCOUNTER — Ambulatory Visit: Payer: Medicaid Other | Admitting: "Endocrinology

## 2017-01-21 ENCOUNTER — Ambulatory Visit: Payer: Medicaid Other | Admitting: "Endocrinology

## 2017-04-20 ENCOUNTER — Other Ambulatory Visit: Payer: Self-pay

## 2017-04-20 MED ORDER — INSULIN ASPART 100 UNIT/ML ~~LOC~~ SOLN: 10.0000 [IU] | Freq: Three times a day (TID) | SUBCUTANEOUS | 2 refills | Status: DC

## 2018-12-07 ENCOUNTER — Telehealth: Payer: Self-pay | Admitting: "Endocrinology

## 2018-12-07 NOTE — Telephone Encounter (Signed)
Received referral to get re-established with Dr Dorris Fetch on 10/21. Called pt left VM and no answer, called pt on 12/2. Patient declined, closing referral

## 2019-10-24 ENCOUNTER — Encounter (HOSPITAL_COMMUNITY): Payer: Self-pay

## 2019-10-24 ENCOUNTER — Emergency Department (HOSPITAL_COMMUNITY)
Admission: EM | Admit: 2019-10-24 | Discharge: 2019-10-25 | Disposition: A | Discharge status: Home / Self Care | Payer: Medicaid - Out of State | Attending: Emergency Medicine | Admitting: Emergency Medicine

## 2019-10-24 DIAGNOSIS — Z7984 Long term (current) use of oral hypoglycemic drugs: Secondary | ICD-10-CM | POA: Insufficient documentation

## 2019-10-24 DIAGNOSIS — I110 Hypertensive heart disease with heart failure: Secondary | ICD-10-CM | POA: Insufficient documentation

## 2019-10-24 DIAGNOSIS — Z794 Long term (current) use of insulin: Secondary | ICD-10-CM | POA: Insufficient documentation

## 2019-10-24 DIAGNOSIS — I509 Heart failure, unspecified: Secondary | ICD-10-CM | POA: Insufficient documentation

## 2019-10-24 DIAGNOSIS — Z79899 Other long term (current) drug therapy: Secondary | ICD-10-CM | POA: Diagnosis not present

## 2019-10-24 DIAGNOSIS — M549 Dorsalgia, unspecified: Secondary | ICD-10-CM | POA: Insufficient documentation

## 2019-10-24 DIAGNOSIS — R531 Weakness: Secondary | ICD-10-CM | POA: Diagnosis not present

## 2019-10-24 DIAGNOSIS — R739 Hyperglycemia, unspecified: Secondary | ICD-10-CM

## 2019-10-24 DIAGNOSIS — K5900 Constipation, unspecified: Secondary | ICD-10-CM | POA: Insufficient documentation

## 2019-10-24 DIAGNOSIS — Z87891 Personal history of nicotine dependence: Secondary | ICD-10-CM | POA: Insufficient documentation

## 2019-10-24 DIAGNOSIS — E119 Type 2 diabetes mellitus without complications: Secondary | ICD-10-CM | POA: Diagnosis not present

## 2019-10-24 DIAGNOSIS — K6289 Other specified diseases of anus and rectum: Secondary | ICD-10-CM

## 2019-10-24 DIAGNOSIS — Z7982 Long term (current) use of aspirin: Secondary | ICD-10-CM | POA: Insufficient documentation

## 2019-10-24 LAB — URINALYSIS, ROUTINE W REFLEX MICROSCOPIC
Bacteria, UA: NONE SEEN
Bilirubin Urine: NEGATIVE
Glucose, UA: >=500 mg/dL — AB
Ketones, ur: NEGATIVE mg/dL
Leukocytes,Ua: NEGATIVE
Nitrite: NEGATIVE
Protein, ur: 100 mg/dL — AB
Specific Gravity, Urine: 1.033 — ABNORMAL HIGH (ref 1.005–1.030)
pH: 5 (ref 5.0–8.0)

## 2019-10-24 LAB — CBC
HCT: 44.9 % (ref 36.0–46.0)
Hemoglobin: 14.9 g/dL (ref 12.0–15.0)
MCH: 30.7 pg (ref 26.0–34.0)
MCHC: 33.2 g/dL (ref 30.0–36.0)
MCV: 92.4 fL (ref 80.0–100.0)
Platelets: 281 10*3/uL (ref 150–400)
RBC: 4.86 MIL/uL (ref 3.87–5.11)
RDW: 13.6 % (ref 11.5–15.5)
WBC: 10.9 10*3/uL — ABNORMAL HIGH (ref 4.0–10.5)
nRBC: 0 % (ref 0.0–0.2)

## 2019-10-24 LAB — COMPREHENSIVE METABOLIC PANEL
ALT: 22 U/L (ref 0–44)
AST: 20 U/L (ref 15–41)
Albumin: 3.1 g/dL — ABNORMAL LOW (ref 3.5–5.0)
Alkaline Phosphatase: 111 U/L (ref 38–126)
Anion gap: 14 (ref 5–15)
BUN: 11 mg/dL (ref 8–23)
CO2: 20 mmol/L — ABNORMAL LOW (ref 22–32)
Calcium: 9.3 mg/dL (ref 8.9–10.3)
Chloride: 92 mmol/L — ABNORMAL LOW (ref 98–111)
Creatinine, Ser: 0.84 mg/dL (ref 0.44–1.00)
GFR, Estimated: >60 mL/min (ref >60)
Glucose, Bld: 486 mg/dL — ABNORMAL HIGH (ref 70–99)
Potassium: 4.6 mmol/L (ref 3.5–5.1)
Sodium: 126 mmol/L — ABNORMAL LOW (ref 135–145)
Total Bilirubin: 0.7 mg/dL (ref 0.3–1.2)
Total Protein: 6.4 g/dL — ABNORMAL LOW (ref 6.5–8.1)

## 2019-10-24 LAB — LIPASE, BLOOD: Lipase: 39 U/L (ref 11–51)

## 2019-10-24 LAB — CBG MONITORING, ED: Glucose-Capillary: 447 mg/dL — ABNORMAL HIGH (ref 70–99)

## 2019-10-24 NOTE — ED Triage Notes (Signed)
Pt bib [facility] from home w/ c/o constipation x 2 days. Pt states when she wipes she notes dark red blood on toilet paper. Pt endorses abdominal pain and pain at rectum. Pt recently relocated to [facility] and unable to fill prescriptions so has not taken BP or DM medications.  EMS VS:  SPO2 98% RA HR 91  BP 176/114 RR 24  CBG 515

## 2019-10-25 LAB — TROPONIN I (HIGH SENSITIVITY)
Troponin I (High Sensitivity): 25 ng/L — ABNORMAL HIGH (ref <18)
Troponin I (High Sensitivity): 24 ng/L — ABNORMAL HIGH (ref <18)

## 2019-10-25 LAB — CBG MONITORING, ED
Glucose-Capillary: 369 mg/dL — ABNORMAL HIGH (ref 70–99)
Glucose-Capillary: 297 mg/dL — ABNORMAL HIGH (ref 70–99)

## 2019-10-25 MED ORDER — CITALOPRAM HYDROBROMIDE 20 MG PO TABS
20.0000 mg | ORAL_TABLET | Freq: Every day | ORAL | 0 refills | Status: DC
Start: 2019-10-25 — End: 2019-11-25

## 2019-10-25 MED ORDER — GABAPENTIN 800 MG PO TABS
800.0000 mg | ORAL_TABLET | Freq: Three times a day (TID) | ORAL | 0 refills | Status: DC
Start: 2019-10-25 — End: 2019-11-25

## 2019-10-25 MED ORDER — FENOFIBRATE 145 MG PO TABS
145.0000 mg | ORAL_TABLET | Freq: Every day | ORAL | 0 refills | Status: DC
Start: 2019-10-25 — End: 2019-11-25

## 2019-10-25 MED ORDER — MORPHINE SULFATE (PF) 4 MG/ML IV SOLN
4.0000 mg | Freq: Once | INTRAVENOUS | Status: AC
  Administered 2019-10-25: 4 mg via INTRAVENOUS
  Filled 2019-10-25: qty 1

## 2019-10-25 MED ORDER — CLOPIDOGREL BISULFATE 75 MG PO TABS
75.0000 mg | ORAL_TABLET | Freq: Every day | ORAL | 0 refills | Status: DC
Start: 2019-10-25 — End: 2019-11-25

## 2019-10-25 MED ORDER — INSULIN GLARGINE 100 UNIT/ML ~~LOC~~ SOLN: 60.0000 [IU] | Freq: Every day | SUBCUTANEOUS | 0 refills | Status: DC

## 2019-10-25 MED ORDER — SODIUM CHLORIDE 0.9 % IV BOLUS
1000.0000 mL | Freq: Once | INTRAVENOUS | Status: AC
  Administered 2019-10-25: 1000 mL via INTRAVENOUS

## 2019-10-25 MED ORDER — ASPIRIN EC 81 MG PO TBEC
81.0000 mg | DELAYED_RELEASE_TABLET | Freq: Every day | ORAL | 0 refills | Status: DC
Start: 2019-10-25 — End: 2019-11-25

## 2019-10-25 MED ORDER — INSULIN ASPART 100 UNIT/ML ~~LOC~~ SOLN
10.0000 [IU] | Freq: Three times a day (TID) | SUBCUTANEOUS | 0 refills | Status: DC
Start: 2019-10-25 — End: 2019-11-25

## 2019-10-25 MED ORDER — ONDANSETRON HCL 4 MG/2ML IJ SOLN
4.0000 mg | Freq: Once | INTRAMUSCULAR | Status: AC
  Administered 2019-10-25: 4 mg via INTRAVENOUS
  Filled 2019-10-25: qty 2

## 2019-10-25 MED ORDER — ATORVASTATIN CALCIUM 40 MG PO TABS
40.0000 mg | ORAL_TABLET | Freq: Every day | ORAL | 0 refills | Status: DC
Start: 2019-10-25 — End: 2019-11-25

## 2019-10-25 MED ORDER — LISINOPRIL 10 MG PO TABS
10.0000 mg | ORAL_TABLET | Freq: Every day | ORAL | 0 refills | Status: DC
Start: 2019-10-25 — End: 2019-11-25

## 2019-10-25 MED ORDER — METFORMIN HCL 500 MG PO TABS
500.0000 mg | ORAL_TABLET | Freq: Two times a day (BID) | ORAL | 0 refills | Status: DC
Start: 2019-10-25 — End: 2019-11-25

## 2019-10-25 MED ORDER — INSULIN ASPART 100 UNIT/ML ~~LOC~~ SOLN
6.0000 [IU] | Freq: Once | SUBCUTANEOUS | Status: AC
  Administered 2019-10-25: 6 [IU] via INTRAVENOUS

## 2019-10-25 NOTE — ED Notes (Signed)
This writer came back from taking a patient to a room when I looked over to see this patient on the floor. I asked security who states the patient put themselves on the ground. I went over to ask the patient. Patient states 'I laid on the ground and tried to get up but I have vertigo and my legs gave out." this writer plus Yvone Neu, EMT Jeri, NT and security helped me get the patient up off the floor and onto the bench. Vitals obtained and triage RN Anderson Malta notified

## 2019-10-25 NOTE — ED Notes (Signed)
Pt heard to be screaming out during IV team attempted for peripheral IV, this RN assessed pt no acute distress noted at this time.

## 2019-10-25 NOTE — ED Provider Notes (Signed)
[facility] Provider Note   CSN: [id number] Arrival date & time: 10/24/19  1611     History Chief Complaint  Patient presents with  . Constipation    Pamela Knight is a 59 y.o. female.  Patient is a 59 year old female with extensive past medical history including coronary artery disease with CABG, diabetes, hypertension, hyperlipidemia.  Patient is new to the area and has no primary doctor.  Patient is apparently living in a hotel and has no family nearby.  She presents today with multiple complaints including constipation, back pain, weakness, and feeling generally unwell.  She denies to me she is having any fevers or chills.  She is denying any chest pain or difficulty breathing.  The history is provided by the patient.       Past Medical History:  Diagnosis Date  . Diabetes mellitus, type II ([place])   . Heart attack ([place])   . Heart failure ([place])   . Hyperlipidemia   . Hypertension   . Kidney disease     Patient Active Problem List   Diagnosis Date Noted  . Personal history of noncompliance with medical treatment, presenting hazards to health 12/26/2015  . Uncontrolled type 2 diabetes mellitus with stage 2 chronic kidney disease, with long-term current use of insulin ([place]) 12/18/2015  . Essential hypertension, benign 12/18/2015  . Hypercholesteremia 12/18/2015  . DM type 2 causing vascular disease ([place]) 12/18/2015    Past Surgical History:  Procedure Laterality Date  . TONSILLECTOMY       OB History   No obstetric history on file.     Family History  Problem Relation Age of Onset  . Diabetes Mother   . Hypertension Mother   . Heart failure Mother   . Kidney disease Mother   . Heart attack Mother   . Diabetes Father   . Hypertension Father   . Heart failure Father   . Kidney disease Father   . Heart attack Father   . Diabetes Sister   . Hypertension Sister   . Heart failure Sister   . Kidney disease Sister     . Heart attack Sister   . Diabetes Brother   . Hypertension Brother   . Heart failure Brother   . Kidney disease Brother   . Heart attack Brother     Social History   Tobacco Use  . Smoking status: Former Research scientist (life sciences)  . Smokeless tobacco: Never Used  Substance Use Topics  . Alcohol use: No  . Drug use: No    Home Medications Prior to Admission medications   Medication Sig Start Date End Date Taking? Authorizing Provider  aspirin EC 81 MG tablet Take 81 mg by mouth daily.    [provider]  atorvastatin (LIPITOR) 40 MG tablet Take 40 mg by mouth daily.    [provider]  cholecalciferol (VITAMIN D) 1000 units tablet Take 1,000 Units by mouth daily.    [provider]  citalopram (CELEXA) 20 MG tablet Take 20 mg by mouth daily.    [provider]  clopidogrel (PLAVIX) 75 MG tablet Take 75 mg by mouth daily.    [provider]  fenofibrate 160 MG tablet Take 160 mg by mouth daily.    [provider]  gabapentin (NEURONTIN) 800 MG tablet Take 800 mg by mouth 3 (three) times daily.    [provider]  hydrOXYzine (VISTARIL) 25 MG capsule Take 25 mg by mouth 3 (three) times daily  as needed.    [provider]  insulin aspart (NOVOLOG) 100 UNIT/ML injection Inject 10-16 Units into the skin 3 (three) times daily with meals. 04/20/17   Cassandria Anger, MD  insulin glargine (LANTUS) 100 UNIT/ML injection Inject 60 Units into the skin at bedtime.    [provider]  lisinopril (PRINIVIL,ZESTRIL) 10 MG tablet Take 10 mg by mouth daily.    [provider]  metFORMIN (GLUCOPHAGE) 500 MG tablet Take 1 tablet (500 mg total) by mouth 2 (two) times daily with a meal. 12/18/15   Nida, Marella Chimes, MD  Multiple Vitamins-Minerals (CENTRUM SILVER PO) Take by mouth.    [provider]  sertraline (ZOLOFT) 25 MG tablet Take 25 mg by mouth daily.    [provider]  vitamin B-12  (CYANOCOBALAMIN) 1000 MCG tablet Take 1,000 mcg by mouth daily.    [provider]    Allergies    Avandia [rosiglitazone] and Niacin and related  Review of Systems   Review of Systems  All other systems reviewed and are negative.   Physical Exam Updated Vital Signs BP (!) 155/100 (BP Location: Right Arm)   Pulse 90   Temp (!) 97.4 F (36.3 C) (Oral)   Resp 16   SpO2 99%   Physical Exam Vitals and nursing note reviewed.  Constitutional:      General: She is not in acute distress.    Appearance: She is well-developed. She is not diaphoretic.  HENT:     Head: Normocephalic and atraumatic.  Cardiovascular:     Rate and Rhythm: Normal rate and regular rhythm.     Heart sounds: No murmur heard.  No friction rub. No gallop.   Pulmonary:     Effort: Pulmonary effort is normal. No respiratory distress.     Breath sounds: Normal breath sounds. No wheezing.  Abdominal:     General: Bowel sounds are normal. There is no distension.     Palpations: Abdomen is soft.     Tenderness: There is no abdominal tenderness.  Musculoskeletal:        General: Normal range of motion.     Cervical back: Normal range of motion and neck supple.  Skin:    General: Skin is warm and dry.  Neurological:     Mental Status: She is alert and oriented to person, place, and time.     ED Results / Procedures / Treatments   Labs (all labs ordered are listed, but only abnormal results are displayed) Labs Reviewed  COMPREHENSIVE METABOLIC PANEL - Abnormal; Notable for the following components:      Result Value   Sodium 126 (*)    Chloride 92 (*)    CO2 20 (*)    Glucose, Bld 486 (*)    Total Protein 6.4 (*)    Albumin 3.1 (*)    All other components within normal limits  CBC - Abnormal; Notable for the following components:   WBC 10.9 (*)    All other components within normal limits  URINALYSIS, ROUTINE W REFLEX MICROSCOPIC - Abnormal; Notable for the following components:   Specific  Gravity, Urine 1.033 (*)    Glucose, UA >=500 (*)    Hgb urine dipstick SMALL (*)    Protein, ur 100 (*)    All other components within normal limits  CBG MONITORING, ED - Abnormal; Notable for the following components:   Glucose-Capillary 447 (*)    All other components within normal limits  LIPASE, BLOOD  CBG MONITORING, ED    EKG EKG Interpretation  Date/Time:  Wednesday October 25 2019 02:01:37 EDT Ventricular Rate:  78 PR Interval:    QRS Duration: 91 QT Interval:  381 QTC Calculation: 434 R Axis:   68 Text Interpretation: Sinus rhythm LAE, consider biatrial enlargement Borderline T wave abnormalities No significant change since 02/06/2007 Confirmed by Veryl Speak [phone]) on 10/25/2019 2:58:35 AM   Radiology No results found.  Procedures Procedures (including critical care time)  Medications Ordered in ED Medications  sodium chloride 0.9 % bolus 1,000 mL (has no administration in time range)  morphine 4 MG/ML injection 4 mg (has no administration in time range)  ondansetron (ZOFRAN) injection 4 mg (has no administration in time range)    ED Course  I have reviewed the triage vital signs and the nursing notes.  Pertinent labs & imaging results that were available during my care of the patient were reviewed by me and considered in my medical decision making (see chart for details).    MDM Rules/Calculators/A&P  Patient is a 59 year old female with past medical history as described in the HPI. She has recently relocated to [facility] and has no primary care. She is apparently living in a hotel and I get the feeling she does not like her living arrangements.  Patient's work-up thus far is unremarkable with the exception of hyperglycemia with no evidence for DKA. Patient's EKG and troponin are is unremarkable.  She is complaining of rectal pain and difficulty having a bowel movement. Rectal examination reveals no obvious hemorrhoids. She does have exquisite  tenderness with digital rectal exam and I suspect the bleeding and pain she is describing is related to a fissure. Patient will be recommended to take fiber supplementation and stool softeners.  I will also provide prescriptions for her home medications. She is to obtain a primary care physician if she plans to remain in the area and follow-up with them.  She does seem to have multiple social issues, but no medical indication for admission.  Final Clinical Impression(s) / ED Diagnoses Final diagnoses:  None    Rx / DC Orders ED Discharge Orders    None       Veryl Speak, MD 10/25/19 [phone]

## 2019-10-25 NOTE — Discharge Instructions (Addendum)
Begin taking Metamucil, 1 heaping teaspoon in a glass of water 3 times daily.  Also begin taking Colace (equate stool softener) 100 mg twice daily.  Prescriptions of your medications have been refilled here today in the ER.  You need to obtain a primary care physician for additional medication refills.

## 2019-11-15 ENCOUNTER — Emergency Department (HOSPITAL_COMMUNITY): Payer: Medicaid - Out of State

## 2019-11-15 ENCOUNTER — Other Ambulatory Visit: Payer: Self-pay

## 2019-11-15 ENCOUNTER — Inpatient Hospital Stay (HOSPITAL_COMMUNITY)
Admission: EM | Admit: 2019-11-15 | Discharge: 2019-11-25 | DRG: 617 | Disposition: A | Discharge status: Home / Self Care | Payer: Medicaid - Out of State | Attending: Internal Medicine | Admitting: Internal Medicine

## 2019-11-15 ENCOUNTER — Encounter (HOSPITAL_COMMUNITY): Payer: Self-pay | Admitting: Emergency Medicine

## 2019-11-15 DIAGNOSIS — E7800 Pure hypercholesterolemia, unspecified: Secondary | ICD-10-CM | POA: Diagnosis present

## 2019-11-15 DIAGNOSIS — E1142 Type 2 diabetes mellitus with diabetic polyneuropathy: Secondary | ICD-10-CM | POA: Diagnosis present

## 2019-11-15 DIAGNOSIS — I10 Essential (primary) hypertension: Secondary | ICD-10-CM | POA: Diagnosis not present

## 2019-11-15 DIAGNOSIS — I130 Hypertensive heart and chronic kidney disease with heart failure and stage 1 through stage 4 chronic kidney disease, or unspecified chronic kidney disease: Secondary | ICD-10-CM | POA: Diagnosis present

## 2019-11-15 DIAGNOSIS — Z23 Encounter for immunization: Secondary | ICD-10-CM

## 2019-11-15 DIAGNOSIS — E1169 Type 2 diabetes mellitus with other specified complication: Secondary | ICD-10-CM | POA: Diagnosis present

## 2019-11-15 DIAGNOSIS — I2583 Coronary atherosclerosis due to lipid rich plaque: Secondary | ICD-10-CM | POA: Diagnosis not present

## 2019-11-15 DIAGNOSIS — Z888 Allergy status to other drugs, medicaments and biological substances status: Secondary | ICD-10-CM

## 2019-11-15 DIAGNOSIS — E1122 Type 2 diabetes mellitus with diabetic chronic kidney disease: Secondary | ICD-10-CM | POA: Diagnosis present

## 2019-11-15 DIAGNOSIS — IMO0002 Reserved for concepts with insufficient information to code with codable children: Secondary | ICD-10-CM

## 2019-11-15 DIAGNOSIS — M86172 Other acute osteomyelitis, left ankle and foot: Secondary | ICD-10-CM | POA: Diagnosis present

## 2019-11-15 DIAGNOSIS — Z66 Do not resuscitate: Secondary | ICD-10-CM | POA: Diagnosis present

## 2019-11-15 DIAGNOSIS — Z91199 Patient's noncompliance with other medical treatment and regimen due to unspecified reason: Secondary | ICD-10-CM

## 2019-11-15 DIAGNOSIS — N1831 Chronic kidney disease, stage 3a: Secondary | ICD-10-CM | POA: Diagnosis present

## 2019-11-15 DIAGNOSIS — Z7902 Long term (current) use of antithrombotics/antiplatelets: Secondary | ICD-10-CM

## 2019-11-15 DIAGNOSIS — Z7982 Long term (current) use of aspirin: Secondary | ICD-10-CM

## 2019-11-15 DIAGNOSIS — E785 Hyperlipidemia, unspecified: Secondary | ICD-10-CM | POA: Diagnosis present

## 2019-11-15 DIAGNOSIS — L02612 Cutaneous abscess of left foot: Secondary | ICD-10-CM | POA: Diagnosis present

## 2019-11-15 DIAGNOSIS — B9561 Methicillin susceptible Staphylococcus aureus infection as the cause of diseases classified elsewhere: Secondary | ICD-10-CM | POA: Diagnosis present

## 2019-11-15 DIAGNOSIS — I2510 Atherosclerotic heart disease of native coronary artery without angina pectoris: Secondary | ICD-10-CM | POA: Diagnosis present

## 2019-11-15 DIAGNOSIS — Z833 Family history of diabetes mellitus: Secondary | ICD-10-CM | POA: Diagnosis not present

## 2019-11-15 DIAGNOSIS — M869 Osteomyelitis, unspecified: Secondary | ICD-10-CM | POA: Diagnosis present

## 2019-11-15 DIAGNOSIS — Z6832 Body mass index (BMI) 32.0-32.9, adult: Secondary | ICD-10-CM | POA: Diagnosis not present

## 2019-11-15 DIAGNOSIS — M65872 Other synovitis and tenosynovitis, left ankle and foot: Secondary | ICD-10-CM | POA: Diagnosis present

## 2019-11-15 DIAGNOSIS — I5032 Chronic diastolic (congestive) heart failure: Secondary | ICD-10-CM | POA: Diagnosis present

## 2019-11-15 DIAGNOSIS — I252 Old myocardial infarction: Secondary | ICD-10-CM

## 2019-11-15 DIAGNOSIS — Z841 Family history of disorders of kidney and ureter: Secondary | ICD-10-CM | POA: Diagnosis not present

## 2019-11-15 DIAGNOSIS — Z79899 Other long term (current) drug therapy: Secondary | ICD-10-CM

## 2019-11-15 DIAGNOSIS — N179 Acute kidney failure, unspecified: Secondary | ICD-10-CM | POA: Diagnosis present

## 2019-11-15 DIAGNOSIS — E440 Moderate protein-calorie malnutrition: Secondary | ICD-10-CM | POA: Diagnosis present

## 2019-11-15 DIAGNOSIS — Z794 Long term (current) use of insulin: Secondary | ICD-10-CM

## 2019-11-15 DIAGNOSIS — E11621 Type 2 diabetes mellitus with foot ulcer: Secondary | ICD-10-CM | POA: Diagnosis not present

## 2019-11-15 DIAGNOSIS — Z87891 Personal history of nicotine dependence: Secondary | ICD-10-CM

## 2019-11-15 DIAGNOSIS — Z8249 Family history of ischemic heart disease and other diseases of the circulatory system: Secondary | ICD-10-CM

## 2019-11-15 DIAGNOSIS — E1165 Type 2 diabetes mellitus with hyperglycemia: Secondary | ICD-10-CM

## 2019-11-15 DIAGNOSIS — Z951 Presence of aortocoronary bypass graft: Secondary | ICD-10-CM | POA: Diagnosis not present

## 2019-11-15 DIAGNOSIS — L97509 Non-pressure chronic ulcer of other part of unspecified foot with unspecified severity: Secondary | ICD-10-CM | POA: Diagnosis not present

## 2019-11-15 DIAGNOSIS — Z20822 Contact with and (suspected) exposure to covid-19: Secondary | ICD-10-CM | POA: Diagnosis present

## 2019-11-15 DIAGNOSIS — E1159 Type 2 diabetes mellitus with other circulatory complications: Secondary | ICD-10-CM | POA: Diagnosis present

## 2019-11-15 DIAGNOSIS — Z9119 Patient's noncompliance with other medical treatment and regimen: Secondary | ICD-10-CM

## 2019-11-15 LAB — CBC
HCT: 34.2 % — ABNORMAL LOW (ref 36.0–46.0)
Hemoglobin: 11.8 g/dL — ABNORMAL LOW (ref 12.0–15.0)
MCH: 31.7 pg (ref 26.0–34.0)
MCHC: 34.5 g/dL (ref 30.0–36.0)
MCV: 91.9 fL (ref 80.0–100.0)
Platelets: 381 10*3/uL (ref 150–400)
RBC: 3.72 MIL/uL — ABNORMAL LOW (ref 3.87–5.11)
RDW: 12.8 % (ref 11.5–15.5)
WBC: 17.9 10*3/uL — ABNORMAL HIGH (ref 4.0–10.5)
nRBC: 0 % (ref 0.0–0.2)

## 2019-11-15 LAB — COMPREHENSIVE METABOLIC PANEL
ALT: 17 U/L (ref 0–44)
AST: 21 U/L (ref 15–41)
Albumin: 3.3 g/dL — ABNORMAL LOW (ref 3.5–5.0)
Alkaline Phosphatase: 105 U/L (ref 38–126)
Anion gap: 12 (ref 5–15)
BUN: 14 mg/dL (ref 8–23)
CO2: 26 mmol/L (ref 22–32)
Calcium: 9.4 mg/dL (ref 8.9–10.3)
Chloride: 91 mmol/L — ABNORMAL LOW (ref 98–111)
Creatinine, Ser: 1.32 mg/dL — ABNORMAL HIGH (ref 0.44–1.00)
GFR, Estimated: 46 mL/min — ABNORMAL LOW (ref >60)
Glucose, Bld: 334 mg/dL — ABNORMAL HIGH (ref 70–99)
Potassium: 4.9 mmol/L (ref 3.5–5.1)
Sodium: 129 mmol/L — ABNORMAL LOW (ref 135–145)
Total Bilirubin: 0.5 mg/dL (ref 0.3–1.2)
Total Protein: 7.6 g/dL (ref 6.5–8.1)

## 2019-11-15 LAB — CBG MONITORING, ED: Glucose-Capillary: 374 mg/dL — ABNORMAL HIGH (ref 70–99)

## 2019-11-15 LAB — HEMOGLOBIN A1C
Hgb A1c MFr Bld: 14.7 % — ABNORMAL HIGH (ref 4.8–5.6)
Mean Plasma Glucose: 375.19 mg/dL

## 2019-11-15 LAB — RESPIRATORY PANEL BY RT PCR (FLU A&B, COVID)
Influenza A by PCR: NEGATIVE
Influenza B by PCR: NEGATIVE
SARS Coronavirus 2 by RT PCR: NEGATIVE

## 2019-11-15 LAB — LACTIC ACID, PLASMA: Lactic Acid, Venous: 1.5 mmol/L (ref 0.5–1.9)

## 2019-11-15 LAB — LIPASE, BLOOD: Lipase: 26 U/L (ref 11–51)

## 2019-11-15 MED ORDER — FENOFIBRATE 54 MG PO TABS
54.0000 mg | ORAL_TABLET | Freq: Every day | ORAL | Status: DC
  Administered 2019-11-16 – 2019-11-25 (×9): 54 mg via ORAL
  Filled 2019-11-15 (×10): qty 1

## 2019-11-15 MED ORDER — GABAPENTIN 400 MG PO CAPS
800.0000 mg | ORAL_CAPSULE | Freq: Three times a day (TID) | ORAL | Status: DC
  Administered 2019-11-15 – 2019-11-25 (×29): 800 mg via ORAL
  Filled 2019-11-15 (×29): qty 2

## 2019-11-15 MED ORDER — LAMOTRIGINE 100 MG PO TABS
100.0000 mg | ORAL_TABLET | Freq: Two times a day (BID) | ORAL | Status: DC
  Administered 2019-11-15 – 2019-11-25 (×20): 100 mg via ORAL
  Filled 2019-11-15 (×20): qty 1

## 2019-11-15 MED ORDER — ONDANSETRON HCL 4 MG/2ML IJ SOLN: 4.0000 mg | Freq: Four times a day (QID) | INTRAMUSCULAR | Status: DC | PRN

## 2019-11-15 MED ORDER — ATORVASTATIN CALCIUM 40 MG PO TABS
40.0000 mg | ORAL_TABLET | Freq: Every day | ORAL | Status: DC
  Administered 2019-11-15 – 2019-11-25 (×10): 40 mg via ORAL
  Filled 2019-11-15 (×10): qty 1

## 2019-11-15 MED ORDER — METOPROLOL TARTRATE 50 MG PO TABS
50.0000 mg | ORAL_TABLET | Freq: Every day | ORAL | Status: DC
  Administered 2019-11-15 – 2019-11-25 (×10): 50 mg via ORAL
  Filled 2019-11-15 (×9): qty 1
  Filled 2019-11-15: qty 2
  Filled 2019-11-15: qty 1

## 2019-11-15 MED ORDER — VANCOMYCIN HCL 1750 MG/350ML IV SOLN
1750.0000 mg | Freq: Once | INTRAVENOUS | Status: AC
  Administered 2019-11-15: 1750 mg via INTRAVENOUS
  Filled 2019-11-15: qty 350

## 2019-11-15 MED ORDER — INSULIN GLARGINE 100 UNIT/ML ~~LOC~~ SOLN
30.0000 [IU] | Freq: Every day | SUBCUTANEOUS | Status: DC
  Administered 2019-11-15: 30 [IU] via SUBCUTANEOUS
  Filled 2019-11-15: qty 0.3

## 2019-11-15 MED ORDER — SODIUM CHLORIDE 0.9 % IV BOLUS
1000.0000 mL | Freq: Once | INTRAVENOUS | Status: AC
  Administered 2019-11-15: 1000 mL via INTRAVENOUS

## 2019-11-15 MED ORDER — METFORMIN HCL 500 MG PO TABS
500.0000 mg | ORAL_TABLET | Freq: Two times a day (BID) | ORAL | Status: DC
  Administered 2019-11-16 – 2019-11-25 (×17): 500 mg via ORAL
  Filled 2019-11-15 (×17): qty 1

## 2019-11-15 MED ORDER — PIPERACILLIN-TAZOBACTAM 3.375 G IVPB 30 MIN
3.3750 g | Freq: Once | INTRAVENOUS | Status: AC
  Administered 2019-11-15: 3.375 g via INTRAVENOUS
  Filled 2019-11-15: qty 50

## 2019-11-15 MED ORDER — INSULIN ASPART 100 UNIT/ML ~~LOC~~ SOLN
0.0000 [IU] | Freq: Three times a day (TID) | SUBCUTANEOUS | Status: DC
  Administered 2019-11-16: 7 [IU] via SUBCUTANEOUS
  Filled 2019-11-15: qty 0.09

## 2019-11-15 MED ORDER — ACETAMINOPHEN 650 MG RE SUPP: 650.0000 mg | Freq: Four times a day (QID) | RECTAL | Status: DC | PRN

## 2019-11-15 MED ORDER — ONDANSETRON HCL 4 MG PO TABS: 4.0000 mg | ORAL_TABLET | Freq: Four times a day (QID) | ORAL | Status: DC | PRN

## 2019-11-15 MED ORDER — PIPERACILLIN-TAZOBACTAM 3.375 G IVPB
3.3750 g | Freq: Three times a day (TID) | INTRAVENOUS | Status: DC
  Administered 2019-11-16 – 2019-11-18 (×8): 3.375 g via INTRAVENOUS
  Filled 2019-11-15 (×8): qty 50

## 2019-11-15 MED ORDER — ACETAMINOPHEN 325 MG PO TABS
650.0000 mg | ORAL_TABLET | Freq: Four times a day (QID) | ORAL | Status: DC | PRN
  Administered 2019-11-15 – 2019-11-24 (×10): 650 mg via ORAL
  Filled 2019-11-15 (×11): qty 2

## 2019-11-15 MED ORDER — INSULIN ASPART 100 UNIT/ML ~~LOC~~ SOLN
0.0000 [IU] | Freq: Every day | SUBCUTANEOUS | Status: DC
  Administered 2019-11-15: 5 [IU] via SUBCUTANEOUS
  Filled 2019-11-15: qty 0.05

## 2019-11-15 MED ORDER — ASPIRIN EC 81 MG PO TBEC
81.0000 mg | DELAYED_RELEASE_TABLET | Freq: Every day | ORAL | Status: DC
  Administered 2019-11-16 – 2019-11-25 (×9): 81 mg via ORAL
  Filled 2019-11-15 (×9): qty 1

## 2019-11-15 MED ORDER — INFLUENZA VAC SPLIT QUAD 0.5 ML IM SUSY
0.5000 mL | PREFILLED_SYRINGE | INTRAMUSCULAR | Status: AC
  Administered 2019-11-16: 0.5 mL via INTRAMUSCULAR
  Filled 2019-11-15: qty 0.5

## 2019-11-15 MED ORDER — ENOXAPARIN SODIUM 40 MG/0.4ML ~~LOC~~ SOLN
40.0000 mg | SUBCUTANEOUS | Status: DC
  Administered 2019-11-15 – 2019-11-24 (×10): 40 mg via SUBCUTANEOUS
  Filled 2019-11-15 (×10): qty 0.4

## 2019-11-15 MED ORDER — VANCOMYCIN HCL 750 MG/150ML IV SOLN
750.0000 mg | INTRAVENOUS | Status: DC
  Administered 2019-11-16 – 2019-11-17 (×2): 750 mg via INTRAVENOUS
  Filled 2019-11-15 (×3): qty 150

## 2019-11-15 NOTE — Progress Notes (Signed)
Pharmacy Antibiotic Note  Pamela Knight is a 59 y.o. female admitted on 11/15/2019 with osteomyelitis.  Pharmacy has been consulted for vancomycin + piperacillin/tazobactam dosing.  Pt presenting with weakness, fever/chills. PMH significant for T2DM with left foot wound, broad spectrum antibiotics initiated for OM.   Today, 11/15/19  WBC 17.9  SCr 1.32, CrCl = 42 mL/min  Lactate 1.5  Afebrile  Plan:  Piperacillin/tazobactam 3.375 g IV q8h EI  Vancomycin 1750 mg LD followed by 750 mg IV q24h for goal VT 15-20 mcg/mL and/or AUC 400-550  Monitor renal function and culture data for ability to de-escalate antibiotics  Height: 5' (152.4 cm) Weight: 81.6 kg (180 lb) IBW/kg (Calculated) : 45.5  Temp (24hrs), Avg:98.9 F (37.2 C), Min:98 F (36.7 C), Max:99.7 F (37.6 C)  Recent Labs  Lab 11/15/19 1430 11/15/19 1633  WBC 17.9*  --   CREATININE 1.32*  --   LATICACIDVEN  --  1.5    Estimated Creatinine Clearance: 42.3 mL/min (A) (by C-G formula based on SCr of 1.32 mg/dL (H)).    Allergies  Allergen Reactions  . Avandia [Rosiglitazone]   . Niacin And Related    Antimicrobials this admission: Piperacillin/tazobactam 11/10 >>  vancomycin 11/10 >>   Dose adjustments this admission:  Microbiology results: 11/10 BCx:  11/10 Wound:   Thank you for allowing pharmacy to be a part of this patient's care.  Lenis Noon, PharmD 11/15/2019 7:23 PM

## 2019-11-15 NOTE — H&P (Signed)
History and Physical    Pamela Knight VWU:[id number] DOB: 1960-12-08 DOA: 11/15/2019  I have briefly reviewed the patient's prior medical records in [place]  PCP: Patient, No Pcp Per  Patient coming from: home (hotel)  Chief Complaint: Weakness, fever and chills  HPI: Pamela Knight is a 59 y.o. female with medical history significant of coronary artery disease with history of CABG per patient, CHF, DM 2, hypertension, hyperlipidemia, depression who comes into the hospital with complaints of generalized weakness over the last several weeks as well as intermittent fever and chills.  She has had decreased appetite as well.  She used to live in [place] but relocated to [place] and currently lives in a hotel.  She is quite vague about the reasons of her move but she has no family around.  She denies any chest pain, denies any shortness of breath.  She tells me that her left great toe has had a wound for a few weeks, and two-point has become worse, is more painful with walking.  She has a foot Dr. In [place] but has not been seen in a while.  She complains of chronic neuropathy.  ED Course: In the ER she is afebrile 98, normotensive, satting well on room air.  Her blood work shows a sodium of 129, glucose 334, creatinine 1.34 (GFR 46), lactic acid is 1.5 and WBC is 17.9.  High-sensitivity troponin is 25, 24.  An x-ray of the foot was concerning for osteomyelitis.  Pus was expressed in the ED and cultures were sent.  Dr. Tamera Punt with orthopedic surgery was consulted, recommending antibiotics and admission, and we are asked to admit.  Review of Systems: All systems reviewed, and apart from HPI, all negative  Past Medical History:  Diagnosis Date  . Diabetes mellitus, type II ([place])   . Heart attack ([place])   . Heart failure ([place])   . Hyperlipidemia   . Hypertension   . Kidney disease     Past Surgical History:  Procedure Laterality Date  . TONSILLECTOMY       reports that she has  quit smoking. She has never used smokeless tobacco. She reports that she does not drink alcohol and does not use drugs.  Allergies  Allergen Reactions  . Avandia [Rosiglitazone]   . Niacin And Related     Family History  Problem Relation Age of Onset  . Diabetes Mother   . Hypertension Mother   . Heart failure Mother   . Kidney disease Mother   . Heart attack Mother   . Diabetes Father   . Hypertension Father   . Heart failure Father   . Kidney disease Father   . Heart attack Father   . Diabetes Sister   . Hypertension Sister   . Heart failure Sister   . Kidney disease Sister   . Heart attack Sister   . Diabetes Brother   . Hypertension Brother   . Heart failure Brother   . Kidney disease Brother   . Heart attack Brother     Prior to Admission medications   Medication Sig Start Date End Date Taking? Authorizing Provider  aspirin EC 81 MG tablet Take 1 tablet (81 mg total) by mouth daily. 10/25/19   Veryl Speak, MD  atorvastatin (LIPITOR) 40 MG tablet Take 1 tablet (40 mg total) by mouth daily. 10/25/19   Veryl Speak, MD  cholecalciferol (VITAMIN D) 1000 units tablet Take 1,000 Units by mouth daily.    [provider]  citalopram (CELEXA) 20 MG tablet Take 1 tablet (20 mg total) by mouth daily. 10/25/19   Veryl Speak, MD  clopidogrel (PLAVIX) 75 MG tablet Take 1 tablet (75 mg total) by mouth daily. 10/25/19   Veryl Speak, MD  fenofibrate (TRICOR) 145 MG tablet Take 1 tablet (145 mg total) by mouth daily. 10/25/19   Veryl Speak, MD  gabapentin (NEURONTIN) 800 MG tablet Take 1 tablet (800 mg total) by mouth 3 (three) times daily. 10/25/19   Veryl Speak, MD  hydrOXYzine (VISTARIL) 25 MG capsule Take 25 mg by mouth 3 (three) times daily as needed.    [provider]  insulin aspart (NOVOLOG) 100 UNIT/ML injection Inject 10-16 Units into the skin 3 (three) times daily with meals. 10/25/19   Veryl Speak, MD  insulin glargine (LANTUS) 100 UNIT/ML  injection Inject 0.6 mLs (60 Units total) into the skin at bedtime. 10/25/19   Veryl Speak, MD  lisinopril (ZESTRIL) 10 MG tablet Take 1 tablet (10 mg total) by mouth daily. 10/25/19   Veryl Speak, MD  metFORMIN (GLUCOPHAGE) 500 MG tablet Take 1 tablet (500 mg total) by mouth 2 (two) times daily with a meal. 10/25/19   Veryl Speak, MD  Multiple Vitamins-Minerals (CENTRUM SILVER PO) Take by mouth.    [provider]  sertraline (ZOLOFT) 25 MG tablet Take 25 mg by mouth daily.    [provider]  vitamin B-12 (CYANOCOBALAMIN) 1000 MCG tablet Take 1,000 mcg by mouth daily.    [provider]    Physical Exam: Vitals:   11/15/19 1530 11/15/19 1600 11/15/19 1615 11/15/19 1629  BP: 116/81 (!) 144/54 123/87   Pulse: 77 80 78   Resp: 16     Temp:    98 F (36.7 C)  TempSrc:    Oral  SpO2: 98% 99% 95%     Constitutional: NAD, calm, comfortable Eyes: PERRL, lids and conjunctivae normal ENMT: Mucous membranes are moist Neck: normal, supple Respiratory: clear to auscultation bilaterally, no wheezing, no crackles. Normal respiratory effort.  Cardiovascular: Regular rate and rhythm, 2/6 SEM. No extremity edema. 2+ pedal pulses.  Abdomen: no tenderness, no masses palpated. Bowel sounds positive.  Musculoskeletal: no clubbing / cyanosis. Normal muscle tone.  Skin: Ulcer on the left great toe, no rashes Neurologic: CN 2-12 grossly intact. Strength 5/5 in all 4.  Psychiatric: Normal judgment and insight. Alert and oriented x 3. Normal mood.   Labs on Admission: I have personally reviewed following labs and imaging studies  CBC: Recent Labs  Lab 11/15/19 1430  WBC 17.9*  HGB 11.8*  HCT 34.2*  MCV 91.9  PLT 637   Basic Metabolic Panel: Recent Labs  Lab 11/15/19 1430  NA 129*  K 4.9  CL 91*  CO2 26  GLUCOSE 334*  BUN 14  CREATININE 1.32*  CALCIUM 9.4   Liver Function Tests: Recent Labs  Lab 11/15/19 1430  AST 21  ALT 17  ALKPHOS 105  BILITOT  0.5  PROT 7.6  ALBUMIN 3.3*   Coagulation Profile: No results for input(s): INR, PROTIME in the last 168 hours. BNP (last 3 results) No results for input(s): PROBNP in the last 8760 hours. CBG: No results for input(s): GLUCAP in the last 168 hours. Thyroid Function Tests: No results for input(s): TSH, T4TOTAL, FREET4, T3FREE, THYROIDAB in the last 72 hours. Urine analysis:    Component Value Date/Time   COLORURINE YELLOW 10/24/2019 1957   APPEARANCEUR CLEAR 10/24/2019 1957   LABSPEC 1.033 (H)  10/24/2019 1957   PHURINE 5.0 10/24/2019 1957   GLUCOSEU >=500 (A) 10/24/2019 1957   HGBUR SMALL (A) 10/24/2019 1957   BILIRUBINUR NEGATIVE 10/24/2019 [place] NEGATIVE 10/24/2019 1957   PROTEINUR 100 (A) 10/24/2019 1957   NITRITE NEGATIVE 10/24/2019 1957   LEUKOCYTESUR NEGATIVE 10/24/2019 1957     Radiological Exams on Admission: DG Chest Port 1 View  Result Date: 11/15/2019 CLINICAL DATA:  Fever, weakness. EXAM: PORTABLE CHEST 1 VIEW COMPARISON:  None. FINDINGS: The heart size and mediastinal contours are within normal limits. Both lungs are clear. The visualized skeletal structures are unremarkable. IMPRESSION: No active disease. Electronically Signed   By: Marijo Conception M.D.   On: 11/15/2019 16:45   DG Foot Complete Left  Result Date: 11/15/2019 CLINICAL DATA:  59 year old female with diabetes and toe pain. Fever and chills. History of wound to the great toe. EXAM: LEFT FOOT - COMPLETE 3+ VIEW COMPARISON:  None. FINDINGS: There is resorption of the majority of the distal phalanx of the great toe, likely sequela of longstanding osteomyelitis. There is no acute fracture or dislocation. There is diffuse soft tissue swelling of the great toe. No radiopaque foreign object or soft tissue gas. IMPRESSION: 1. No acute fracture or dislocation. 2. Resorption of the majority of the distal phalanx of the great toe. 3. Diffuse soft tissue swelling of the great toe. Electronically Signed    By: Anner Crete M.D.   On: 11/15/2019 16:47    EKG: Independently reviewed.  Sinus rhythm  Assessment/Plan  Principal Problem Osteomyelitis of the left great toe -Admit patient to the hospital, placed on broad-spectrum antibiotics.  She is nontoxic-appearing, not septic.  WBC is increased to 17 -Orthopedic surgery consulted, will see patient tomorrow, make n.p.o. in case she needs to have amputation  Active Problems Type 2 diabetes mellitus, insulin-dependent -Continue Lantus and NovoLog, continue gabapentin for neuropathy  Hyperlipidemia -Continue Lipitor  Coronary artery disease, history of CABG -No chest pain, slightly elevated troponin but very low levels, flat, not in a pattern consistent with ACS.  Likely demand.  Presumed chronic kidney disease stage IIIa -Creatinine appears at her baseline, will monitor over the next few days  Essential hypertension -Hold lisinopril, monitor blood pressure   DVT prophylaxis: Lovenox  Code Status: DNR  Family Communication: no family at bedside  Disposition Plan: TBD Bed Type: Medsurg Consults called: Orthopedic surgery   Obs/Inp: inpatient  At the time of admission, it appears that the appropriate admission status for this patient is INPATIENT as it is expected that patient will require hospital care > 2 midnights. This is judged to be reasonable and necessary in order to provide the required intensity of service to ensure the patient's safety given: presenting symptoms, initial radiographic and laboratory data and in the context of their chronic comorbidities. Together, these circumstances are felt to place patient at high at high risk for further clinical deterioration threatening life, limb, or organ.  Marzetta Board, MD, PhD [facility]  Contact via [email or web address]  11/15/2019, 5:48 PM

## 2019-11-15 NOTE — ED Provider Notes (Signed)
Pamela Knight   CSN: [id number] Arrival date & time: 11/15/19  1241     History Chief Complaint  Patient presents with  . Fever  . DM ulcer    Pamela Knight is a 59 y.o. female DM, HTN, CAD, presenting for generalized weakness/fatigue for 1 month. Symptoms worsening over the last week with intermittent chills and subjective fever. Endorses decreased appetite and nausea, fatigue, generalized weakness, diarrhea yesterday.  Denies emesis, abd pain, CP, respiratory symptoms. Patient has had chronic wound to left great toe for unknown duration of time, maybe a few weeks. Wound is worsening and is painful with ambulation. She endorses chronic numbness to b/l lower legs from diabetes. She has not been monitoring here BG since September, states she moved here from [place] and packed her glucometer in storage. She has been living in hotels and does not have permanent housing yet.  No known covid exposures, not vaccinated against covid.   The history is provided by the patient.       Past Medical History:  Diagnosis Date  . Diabetes mellitus, type II ([place])   . Heart attack ([place])   . Heart failure ([place])   . Hyperlipidemia   . Hypertension   . Kidney disease     Patient Active Problem List   Diagnosis Date Noted  . Osteomyelitis ([place]) 11/15/2019  . Personal history of noncompliance with medical treatment, presenting hazards to health 12/26/2015  . Uncontrolled type 2 diabetes mellitus with stage 2 chronic kidney disease, with long-term current use of insulin ([place]) 12/18/2015  . Essential hypertension, benign 12/18/2015  . Hypercholesteremia 12/18/2015  . DM type 2 causing vascular disease ([place]) 12/18/2015    Past Surgical History:  Procedure Laterality Date  . TONSILLECTOMY       OB History   No obstetric history on file.     Family History  Problem Relation Age of Onset  . Diabetes Mother   . Hypertension Mother   .  Heart failure Mother   . Kidney disease Mother   . Heart attack Mother   . Diabetes Father   . Hypertension Father   . Heart failure Father   . Kidney disease Father   . Heart attack Father   . Diabetes Sister   . Hypertension Sister   . Heart failure Sister   . Kidney disease Sister   . Heart attack Sister   . Diabetes Brother   . Hypertension Brother   . Heart failure Brother   . Kidney disease Brother   . Heart attack Brother     Social History   Tobacco Use  . Smoking status: Former Research scientist (life sciences)  . Smokeless tobacco: Never Used  Substance Use Topics  . Alcohol use: No  . Drug use: No    Home Medications Prior to Admission medications   Medication Sig Start Date End Date Taking? Authorizing Provider  aspirin EC 81 MG tablet Take 1 tablet (81 mg total) by mouth daily. 10/25/19   Veryl Speak, MD  atorvastatin (LIPITOR) 40 MG tablet Take 1 tablet (40 mg total) by mouth daily. 10/25/19   Veryl Speak, MD  cholecalciferol (VITAMIN D) 1000 units tablet Take 1,000 Units by mouth daily.    [provider]  citalopram (CELEXA) 20 MG tablet Take 1 tablet (20 mg total) by mouth daily. 10/25/19   Veryl Speak, MD  clopidogrel (PLAVIX) 75 MG tablet Take 1 tablet (75 mg total) by mouth daily. 10/25/19  Veryl Speak, MD  fenofibrate (TRICOR) 145 MG tablet Take 1 tablet (145 mg total) by mouth daily. 10/25/19   Veryl Speak, MD  gabapentin (NEURONTIN) 800 MG tablet Take 1 tablet (800 mg total) by mouth 3 (three) times daily. 10/25/19   Veryl Speak, MD  hydrOXYzine (VISTARIL) 25 MG capsule Take 25 mg by mouth 3 (three) times daily as needed.    [provider]  insulin aspart (NOVOLOG) 100 UNIT/ML injection Inject 10-16 Units into the skin 3 (three) times daily with meals. 10/25/19   Veryl Speak, MD  insulin glargine (LANTUS) 100 UNIT/ML injection Inject 0.6 mLs (60 Units total) into the skin at bedtime. 10/25/19   Veryl Speak, MD  lisinopril (ZESTRIL) 10 MG  tablet Take 1 tablet (10 mg total) by mouth daily. 10/25/19   Veryl Speak, MD  metFORMIN (GLUCOPHAGE) 500 MG tablet Take 1 tablet (500 mg total) by mouth 2 (two) times daily with a meal. 10/25/19   Veryl Speak, MD  Multiple Vitamins-Minerals (CENTRUM SILVER PO) Take by mouth.    [provider]  sertraline (ZOLOFT) 25 MG tablet Take 25 mg by mouth daily.    [provider]  vitamin B-12 (CYANOCOBALAMIN) 1000 MCG tablet Take 1,000 mcg by mouth daily.    [provider]    Allergies    Avandia [rosiglitazone] and Niacin and related  Review of Systems   Review of Systems  Constitutional: Positive for appetite change, chills, fatigue and fever.  Respiratory: Negative for cough and shortness of breath.   Cardiovascular: Negative for chest pain.  Gastrointestinal: Positive for diarrhea and nausea. Negative for abdominal pain and vomiting.  Endocrine: Positive for polydipsia and polyuria.  Skin: Positive for wound.  All other systems reviewed and are negative.   Physical Exam Updated Vital Signs BP 123/87   Pulse 78   Temp 98 F (36.7 C) (Oral)   Resp 16   SpO2 95%   Physical Exam Vitals and nursing Knight reviewed.  Constitutional:      General: She is not in acute distress.    Appearance: She is well-developed. She is ill-appearing.  HENT:     Head: Normocephalic and atraumatic.  Eyes:     Conjunctiva/sclera: Conjunctivae normal.  Cardiovascular:     Rate and Rhythm: Normal rate and regular rhythm.  Pulmonary:     Effort: Pulmonary effort is normal. No respiratory distress.     Breath sounds: Normal breath sounds.  Abdominal:     General: Bowel sounds are normal.     Palpations: Abdomen is soft.     Tenderness: There is no abdominal tenderness. There is no guarding or rebound.  Musculoskeletal:     Comments: Left great toe with swelling, erythema.  The nail plate is missing, this does not appear to be acute.  There is a draining small wound  over the nail bed with purulent drainage expressed.  Erythema and induration involves the entire toe and extends to the medial aspect of the dorsal and plantar aspect of the foot, just distal to the ankle.  Patient has no sensation to bilateral feet/lower legs up to about the knee level.  Palpable DP pulse.  Skin:    General: Skin is warm.  Neurological:     Mental Status: She is alert.  Psychiatric:        Behavior: Behavior normal.     ED Results / Procedures / Treatments   Labs (all labs ordered are listed, but only abnormal results are displayed)  Labs Reviewed  COMPREHENSIVE METABOLIC PANEL - Abnormal; Notable for the following components:      Result Value   Sodium 129 (*)    Chloride 91 (*)    Glucose, Bld 334 (*)    Creatinine, Ser 1.32 (*)    Albumin 3.3 (*)    GFR, Estimated 46 (*)    All other components within normal limits  CBC - Abnormal; Notable for the following components:   WBC 17.9 (*)    RBC 3.72 (*)    Hemoglobin 11.8 (*)    HCT 34.2 (*)    All other components within normal limits  CULTURE, BLOOD (ROUTINE X 2)  CULTURE, BLOOD (ROUTINE X 2)  AEROBIC CULTURE (SUPERFICIAL SPECIMEN)  RESPIRATORY PANEL BY RT PCR (FLU A&B, COVID)  LIPASE, BLOOD  LACTIC ACID, PLASMA  URINALYSIS, ROUTINE W REFLEX MICROSCOPIC  LACTIC ACID, PLASMA  HIV ANTIBODY (ROUTINE TESTING W REFLEX)  COMPREHENSIVE METABOLIC PANEL  CBC    EKG None  Radiology DG Chest Port 1 View  Result Date: 11/15/2019 CLINICAL DATA:  Fever, weakness. EXAM: PORTABLE CHEST 1 VIEW COMPARISON:  None. FINDINGS: The heart size and mediastinal contours are within normal limits. Both lungs are clear. The visualized skeletal structures are unremarkable. IMPRESSION: No active disease. Electronically Signed   By: Marijo Conception M.D.   On: 11/15/2019 16:45   DG Foot Complete Left  Result Date: 11/15/2019 CLINICAL DATA:  59 year old female with diabetes and toe pain. Fever and chills. History of wound to  the great toe. EXAM: LEFT FOOT - COMPLETE 3+ VIEW COMPARISON:  None. FINDINGS: There is resorption of the majority of the distal phalanx of the great toe, likely sequela of longstanding osteomyelitis. There is no acute fracture or dislocation. There is diffuse soft tissue swelling of the great toe. No radiopaque foreign object or soft tissue gas. IMPRESSION: 1. No acute fracture or dislocation. 2. Resorption of the majority of the distal phalanx of the great toe. 3. Diffuse soft tissue swelling of the great toe. Electronically Signed   By: Anner Crete M.D.   On: 11/15/2019 16:47    Procedures Procedures (including critical care time)  Medications Ordered in ED Medications  piperacillin-tazobactam (ZOSYN) IVPB 3.375 g (has no administration in time range)  influenza vac split quadrivalent PF (FLUARIX) injection 0.5 mL (has no administration in time range)  enoxaparin (LOVENOX) injection 40 mg (has no administration in time range)  acetaminophen (TYLENOL) tablet 650 mg (has no administration in time range)    Or  acetaminophen (TYLENOL) suppository 650 mg (has no administration in time range)  ondansetron (ZOFRAN) tablet 4 mg (has no administration in time range)    Or  ondansetron (ZOFRAN) injection 4 mg (has no administration in time range)  sodium chloride 0.9 % bolus 1,000 mL (1,000 mLs Intravenous New Bag/Given (Non-Interop) 11/15/19 1643)    ED Course  I have reviewed the triage vital signs and the nursing notes.  Pertinent labs & imaging results that were available during my care of the patient were reviewed by me and considered in my medical decision making (see chart for details).  Clinical Course as of Nov 15 1743  Wed Nov 15, 2019  1726 Dr. Tamera Punt with ortho consulted. Recommends medicine admission, start abx, ortho to see tomorrow.    [JR]    Clinical Course User Index [JR] Jace Fermin, [place] N, PA-C   MDM Rules/Calculators/A&P  Patient with  history of insulin-dependent type 2 diabetes, presenting with 1 month of generalized malaise, worsening of the last few days.  She also reports chronic wound to left great toe, is unsure of the duration that it has been present.  She is a rather poor historian.  She does not appear to be controlling her diabetes well, has not been monitoring blood glucose levels.  She has diabetic neuropathy to lower extremities bilaterally.  She has draining wound to left great toe, purulent drainage was expressed and wound culture was sent.  Erythema extends through the medial aspect of the foot.  X-ray shows chronic osteomyelitis of the distal phalanx of the toe, however given presentation today concern for acute on chronic osteomyelitis.  She is afebrile although ill-appearing.  Labs with leukocytosis of 17.9, hyperglycemia of 334, not apparently in DKA.  She does have AKI as well with creatinine 1.3, up from labs 3 weeks ago at 0.8.  Blood cultures and Covid swabs sent.  IV fluids given for hyperglycemia and AKI.   Consulted with orthopedist on-call, Dr. Tamera Punt.  Agrees with medical admission, recommends antibiotics, Ortho to see in the morning.  N.p.o. at midnight, orders placed.  Patient admitted to hospitalist service for further management.  She is agreeable with plan.  Final Clinical Impression(s) / ED Diagnoses Final diagnoses:  Acute osteomyelitis of toe of left foot ([place])  AKI (acute kidney injury) [facility])    Rx / [place] Orders ED Discharge Orders    None       [place] Cantero, [place] N, PA-C 11/15/19 1745    Milton Ferguson, MD 11/15/19 [phone]

## 2019-11-15 NOTE — ED Triage Notes (Signed)
Per EMS-nausea and weakness for 2 days-temp of 101.3-wound to left large toe which is infected-CBG 167-100 mg of Tylenol given in route

## 2019-11-16 ENCOUNTER — Encounter (HOSPITAL_COMMUNITY): Payer: Self-pay | Admitting: Internal Medicine

## 2019-11-16 ENCOUNTER — Inpatient Hospital Stay (HOSPITAL_COMMUNITY): Payer: Medicaid - Out of State

## 2019-11-16 DIAGNOSIS — Z794 Long term (current) use of insulin: Secondary | ICD-10-CM

## 2019-11-16 DIAGNOSIS — M86172 Other acute osteomyelitis, left ankle and foot: Secondary | ICD-10-CM | POA: Diagnosis not present

## 2019-11-16 DIAGNOSIS — E11621 Type 2 diabetes mellitus with foot ulcer: Secondary | ICD-10-CM | POA: Diagnosis not present

## 2019-11-16 DIAGNOSIS — I2510 Atherosclerotic heart disease of native coronary artery without angina pectoris: Secondary | ICD-10-CM

## 2019-11-16 DIAGNOSIS — I10 Essential (primary) hypertension: Secondary | ICD-10-CM | POA: Diagnosis not present

## 2019-11-16 DIAGNOSIS — I2583 Coronary atherosclerosis due to lipid rich plaque: Secondary | ICD-10-CM

## 2019-11-16 DIAGNOSIS — L97509 Non-pressure chronic ulcer of other part of unspecified foot with unspecified severity: Secondary | ICD-10-CM

## 2019-11-16 LAB — COMPREHENSIVE METABOLIC PANEL
ALT: 14 U/L (ref 0–44)
AST: 17 U/L (ref 15–41)
Albumin: 2.8 g/dL — ABNORMAL LOW (ref 3.5–5.0)
Alkaline Phosphatase: 92 U/L (ref 38–126)
Anion gap: 9 (ref 5–15)
BUN: 17 mg/dL (ref 8–23)
CO2: 24 mmol/L (ref 22–32)
Calcium: 8.5 mg/dL — ABNORMAL LOW (ref 8.9–10.3)
Chloride: 97 mmol/L — ABNORMAL LOW (ref 98–111)
Creatinine, Ser: 1.26 mg/dL — ABNORMAL HIGH (ref 0.44–1.00)
GFR, Estimated: 49 mL/min — ABNORMAL LOW (ref >60)
Glucose, Bld: 353 mg/dL — ABNORMAL HIGH (ref 70–99)
Potassium: 4.2 mmol/L (ref 3.5–5.1)
Sodium: 130 mmol/L — ABNORMAL LOW (ref 135–145)
Total Bilirubin: 0.7 mg/dL (ref 0.3–1.2)
Total Protein: 6.6 g/dL (ref 6.5–8.1)

## 2019-11-16 LAB — CBC
HCT: 34.7 % — ABNORMAL LOW (ref 36.0–46.0)
Hemoglobin: 11.4 g/dL — ABNORMAL LOW (ref 12.0–15.0)
MCH: 31 pg (ref 26.0–34.0)
MCHC: 32.9 g/dL (ref 30.0–36.0)
MCV: 94.3 fL (ref 80.0–100.0)
Platelets: 390 10*3/uL (ref 150–400)
RBC: 3.68 MIL/uL — ABNORMAL LOW (ref 3.87–5.11)
RDW: 13 % (ref 11.5–15.5)
WBC: 15.9 10*3/uL — ABNORMAL HIGH (ref 4.0–10.5)
nRBC: 0 % (ref 0.0–0.2)

## 2019-11-16 LAB — URINALYSIS, ROUTINE W REFLEX MICROSCOPIC
Bacteria, UA: NONE SEEN
Bilirubin Urine: NEGATIVE
Glucose, UA: >=500 mg/dL — AB
Hgb urine dipstick: NEGATIVE
Ketones, ur: NEGATIVE mg/dL
Leukocytes,Ua: NEGATIVE
Nitrite: NEGATIVE
Protein, ur: NEGATIVE mg/dL
Specific Gravity, Urine: 1.032 — ABNORMAL HIGH (ref 1.005–1.030)
pH: 5 (ref 5.0–8.0)

## 2019-11-16 LAB — GLUCOSE, CAPILLARY
Glucose-Capillary: 347 mg/dL — ABNORMAL HIGH (ref 70–99)
Glucose-Capillary: 509 mg/dL (ref 70–99)
Glucose-Capillary: 438 mg/dL — ABNORMAL HIGH (ref 70–99)
Glucose-Capillary: 312 mg/dL — ABNORMAL HIGH (ref 70–99)

## 2019-11-16 LAB — GLUCOSE, RANDOM: Glucose, Bld: 512 mg/dL (ref 70–99)

## 2019-11-16 LAB — HIV ANTIBODY (ROUTINE TESTING W REFLEX): HIV Screen 4th Generation wRfx: NONREACTIVE

## 2019-11-16 MED ORDER — INSULIN ASPART 100 UNIT/ML ~~LOC~~ SOLN: 0.0000 [IU] | Freq: Three times a day (TID) | SUBCUTANEOUS | Status: DC

## 2019-11-16 MED ORDER — INSULIN ASPART 100 UNIT/ML ~~LOC~~ SOLN
0.0000 [IU] | Freq: Three times a day (TID) | SUBCUTANEOUS | Status: DC
  Administered 2019-11-16 (×2): 15 [IU] via SUBCUTANEOUS
  Administered 2019-11-17: 8 [IU] via SUBCUTANEOUS
  Administered 2019-11-17: 5 [IU] via SUBCUTANEOUS
  Administered 2019-11-18: 11 [IU] via SUBCUTANEOUS
  Administered 2019-11-18: 8 [IU] via SUBCUTANEOUS
  Administered 2019-11-18 – 2019-11-19 (×2): 5 [IU] via SUBCUTANEOUS
  Administered 2019-11-19: 15 [IU] via SUBCUTANEOUS
  Administered 2019-11-19 – 2019-11-20 (×3): 5 [IU] via SUBCUTANEOUS
  Administered 2019-11-20 – 2019-11-21 (×4): 3 [IU] via SUBCUTANEOUS
  Administered 2019-11-22 (×2): 5 [IU] via SUBCUTANEOUS
  Administered 2019-11-22: 3 [IU] via SUBCUTANEOUS
  Administered 2019-11-23: 5 [IU] via SUBCUTANEOUS
  Administered 2019-11-23: 8 [IU] via SUBCUTANEOUS
  Administered 2019-11-23: 5 [IU] via SUBCUTANEOUS
  Administered 2019-11-24: 3 [IU] via SUBCUTANEOUS
  Administered 2019-11-24: 2 [IU] via SUBCUTANEOUS
  Administered 2019-11-24: 8 [IU] via SUBCUTANEOUS
  Administered 2019-11-25 (×2): 3 [IU] via SUBCUTANEOUS

## 2019-11-16 MED ORDER — INSULIN ASPART 100 UNIT/ML ~~LOC~~ SOLN
0.0000 [IU] | Freq: Every day | SUBCUTANEOUS | Status: DC
  Administered 2019-11-16: 4 [IU] via SUBCUTANEOUS
  Administered 2019-11-18 – 2019-11-20 (×3): 2 [IU] via SUBCUTANEOUS

## 2019-11-16 MED ORDER — INSULIN ASPART 100 UNIT/ML ~~LOC~~ SOLN
3.0000 [IU] | Freq: Three times a day (TID) | SUBCUTANEOUS | Status: DC
  Administered 2019-11-16 – 2019-11-23 (×19): 3 [IU] via SUBCUTANEOUS

## 2019-11-16 MED ORDER — ADULT MULTIVITAMIN W/MINERALS CH
1.0000 | ORAL_TABLET | Freq: Every day | ORAL | Status: DC
  Administered 2019-11-16 – 2019-11-25 (×9): 1 via ORAL
  Filled 2019-11-16 (×9): qty 1

## 2019-11-16 MED ORDER — INSULIN GLARGINE 100 UNIT/ML ~~LOC~~ SOLN
40.0000 [IU] | Freq: Every day | SUBCUTANEOUS | Status: DC
  Administered 2019-11-16 – 2019-11-23 (×8): 40 [IU] via SUBCUTANEOUS
  Filled 2019-11-16 (×9): qty 0.4

## 2019-11-16 MED ORDER — GLUCERNA SHAKE PO LIQD
237.0000 mL | Freq: Three times a day (TID) | ORAL | Status: DC
  Administered 2019-11-16 – 2019-11-25 (×16): 237 mL via ORAL
  Filled 2019-11-16 (×29): qty 237

## 2019-11-16 NOTE — Plan of Care (Signed)
  Problem: Education: Goal: Ability to describe self-care measures that may prevent or decrease complications (Diabetes Survival Skills Education) will improve 11/16/2019 0448 by Narda Amber, RN Outcome: Progressing 11/16/2019 0442 by Narda Amber, RN Outcome: Progressing 11/16/2019 0439 by Narda Amber, RN Outcome: Progressing 11/16/2019 0438 by Narda Amber, RN Outcome: Progressing 11/16/2019 0200 by Narda Amber, RN Outcome: Progressing 11/16/2019 0200 by Narda Amber, RN Outcome: Progressing

## 2019-11-16 NOTE — Evaluation (Signed)
Physical Therapy Evaluation Patient Details Name: Pamela Knight MRN: [id number] DOB: 12/25/1960 Today's Date: 11/16/2019   History of Present Illness  Pt admitted with hyperglycemia, weakness, and L great toe osteomyelitis.  Pt with hx of MI, CHF, DM, CAD, CABG, and neuropathy  Clinical Impression  Pt admitted as above and presenting with functional mobility limitations 2* generalized weakness and ambulatory balance deficits.  This date, pt up to ambulate in halls with RW and steady assist.  Pt ltd by c/o fatigue and dizziness (BP 110/81) Pt residing in hotel prior to admit and unsure of dc options.      Follow Up Recommendations SNF;Home health PT (dependent on acute stay progress)    Equipment Recommendations  Rolling walker with 5" wheels    Recommendations for Other Services OT consult     Precautions / Restrictions Precautions Precautions: Fall Precaution Comments: Pt reports multiple previous falls Restrictions Weight Bearing Restrictions: No      Mobility  Bed Mobility Overal bed mobility: Modified Independent             General bed mobility comments: Pt unassisted to EOB sitting    Transfers Overall transfer level: Needs assistance Equipment used: Rolling walker (2 wheeled) Transfers: Sit to/from Stand Sit to Stand: Min guard         General transfer comment: Steady assist only; cues for use of UEs to self assist  Ambulation/Gait Ambulation/Gait assistance: Min guard Gait Distance (Feet): 100 Feet Assistive device: Rolling walker (2 wheeled) Gait Pattern/deviations: Step-through pattern;Decreased step length - right;Decreased step length - left;Shuffle;Trunk flexed Gait velocity: decr   General Gait Details: cues for posture and position from RW; Steady assist only.  Cues for safety with turns  Stairs            Wheelchair Mobility    Modified Rankin (Stroke Patients Only)       Balance Overall balance assessment: Needs  assistance Sitting-balance support: No upper extremity supported;Feet supported Sitting balance-Leahy Scale: Good     Standing balance support: No upper extremity supported Standing balance-Leahy Scale: Fair                               Pertinent Vitals/Pain Pain Assessment: 0-10 Pain Score: 3  Pain Location: back pain Pain Descriptors / Indicators: Aching Pain Intervention(s): Limited activity within patient's tolerance;Monitored during session    Home Living Family/patient expects to be discharged to:: Other (Comment)                 Additional Comments: Pt has been staying at hotel    Prior Function Level of Independence: Independent with assistive device(s)         Comments: Using cane but reports falls     Hand Dominance   Dominant Hand: Right    Extremity/Trunk Assessment   Upper Extremity Assessment Upper Extremity Assessment: Generalized weakness    Lower Extremity Assessment Lower Extremity Assessment: Generalized weakness;RLE deficits/detail;LLE deficits/detail RLE Sensation: history of peripheral neuropathy LLE Sensation: history of peripheral neuropathy       Communication   Communication: No difficulties  Cognition Arousal/Alertness: Awake/alert Behavior During Therapy: WFL for tasks assessed/performed;Impulsive Overall Cognitive Status: Within Functional Limits for tasks assessed                                        General  Comments      Exercises     Assessment/Plan    PT Assessment Patient needs continued PT services  PT Problem List Decreased strength;Decreased activity tolerance;Decreased balance;Decreased mobility;Decreased knowledge of use of DME;Pain       PT Treatment Interventions DME instruction;Gait training;Functional mobility training;Therapeutic activities;Therapeutic exercise;Balance training;Patient/family education    PT Goals (Current goals can be found in the Care Plan  section)  Acute Rehab PT Goals Patient Stated Goal: Regain IND PT Goal Formulation: With patient Time For Goal Achievement: 11/30/19 Potential to Achieve Goals: Good    Frequency Min 3X/week   Barriers to discharge Decreased caregiver support Pt from [place] and living in hotel    Co-evaluation               AM-PAC PT "6 Clicks" Mobility  Outcome Measure Help needed turning from your back to your side while in a flat bed without using bedrails?: None Help needed moving from lying on your back to sitting on the side of a flat bed without using bedrails?: None Help needed moving to and from a bed to a chair (including a wheelchair)?: A Little Help needed standing up from a chair using your arms (e.g., wheelchair or bedside chair)?: A Little Help needed to walk in hospital room?: A Little Help needed climbing 3-5 steps with a railing? : A Little 6 Click Score: 20    End of Session Equipment Utilized During Treatment: Gait belt Activity Tolerance: Patient tolerated treatment well;Patient limited by fatigue Patient left: in chair;with call bell/phone within reach;with chair alarm set Nurse Communication: Mobility status PT Visit Diagnosis: Muscle weakness (generalized) (M62.81);Difficulty in walking, not elsewhere classified (R26.2);Unsteadiness on feet (R26.81)    Time: 6381-7711 PT Time Calculation (min) (ACUTE ONLY): 21 min   Charges:   PT Evaluation $PT Eval Low Complexity: 1 Low          Debe Coder PT Acute Rehabilitation Services Pager [phone] Office [phone]   Tida Saner 11/16/2019, 12:45 PM

## 2019-11-16 NOTE — Progress Notes (Signed)
Initial Nutrition Assessment  DOCUMENTATION CODES:   Non-severe (moderate) malnutrition in context of social or environmental circumstances  INTERVENTION:  Recommend consult to Diabetes Coordinator   Glucerna Shake po TID, each supplement provides 220 kcal and 10 grams of protein  MVI daily  NUTRITION DIAGNOSIS:   Moderate Malnutrition related to social / environmental circumstances as evidenced by mild fat depletion, mild muscle depletion.    GOAL:   Patient will meet greater than or equal to 90% of their needs    MONITOR:   PO intake, Supplement acceptance, Weight trends, Labs, I & O's  REASON FOR ASSESSMENT:   Malnutrition Screening Tool    ASSESSMENT:   Pt admitted with osteomyelitis of the L great toe. PMH significant for CAD with h/o CABG, CHF, DM2, HTN, HLD, depression.   Pt reports having varied appetite for at least 1 month. Attempted to obtain detailed diet history from pt, but was ultimately unsuccessful -- pt reports eating what she can and when she can. This sometimes means pt only has a muffin throughout the entire day. Pt has previously consumed Glucerna and is agreeable to using these supplements again. Pt reports she is currently homeless and says this is why she hasn't been eating much. Pt states she has also lost weight, though this is not reflected in the wt readings.   No UOP documented.  PO Intake: 70% x 1 recorded meal  Labs: Na 130 (L), CBGs 347-509 (H) Medications: ss novolog,3 units novolog TID, 40 units lantus daily, 500mg  glucophage BID  NUTRITION - FOCUSED PHYSICAL EXAM:    Most Recent Value  Orbital Region No depletion  Upper Arm Region Mild depletion  Buccal Region Moderate depletion  Temple Region Mild depletion  Clavicle Bone Region Moderate depletion  Clavicle and Acromion Bone Region Mild depletion  Scapular Bone Region No depletion  Dorsal Hand No depletion  Patellar Region No depletion  Anterior Thigh Region Mild  depletion  Posterior Calf Region Mild depletion  Edema (RD Assessment) None  Hair Other (Comment)  [thinning]  Eyes Reviewed  Mouth Other (Comment)  [missing numerous teeth]  Skin Reviewed  Nails Reviewed       Diet Order:   Diet Order            Diet Carb Modified Fluid consistency: Thin; Room service appropriate? Yes  Diet effective now                 EDUCATION NEEDS:   No education needs have been identified at this time  Skin:  Skin Assessment: Skin Integrity Issues: Skin Integrity Issues:: Other (Comment) Other: non-pressure wound L great toe  Last BM:  11/11  Height:   Ht Readings from Last 1 Encounters:  11/16/19 5\' 2"  (1.575 m)    Weight:   Wt Readings from Last 1 Encounters:  11/16/19 81.7 kg    Ideal Body Weight:  50 kg  BMI:  Body mass index is 32.94 kg/m.  Estimated Nutritional Needs:   Kcal:  1761-6073  Protein:  90-105 grams  Fluid:  >/=1.75 L/d    Larkin Ina, MS, RD, LDN RD pager number and weekend/on-call pager number located in [place].

## 2019-11-16 NOTE — Progress Notes (Signed)
PROGRESS NOTE  Pamela Knight YKD:[id number] DOB: 10/15/1960 DOA: 11/15/2019 PCP: Patient, No Pcp Per   LOS: 1 day   Brief Narrative / Interim history: 59 year old female with CAD, history of CABG, CHF, DM 2, HTN, HLD, depression who came into the hospital with generalized weakness over the last several weeks, intermittent fever or chills.  She used to live in [place], section 8 housing, however apparently lost that and moved to [place].  Currently living in a motel.  Reports that her left great toe has had a wound which is progressively getting worse with increased pain and increased drainage.  Imaging in the ER showed osteomyelitis  Subjective / 24h Interval events: Reports that she is doing well this morning, feeling a little bit more energy.  Her left foot is not bothering her as much  Assessment & Plan: Principal Problem Osteomyelitis of the left great toe -Patient was placed on broad-spectrum antibiotics, cultures were sent and pending.  Orthopedic surgery was consulted, they do recommend operative management but would like to do MRI to better assess depth of infection -Continue broad-spectrum antibiotics meanwhile  Active Problems Type 2 diabetes mellitus, insulin-dependent -Continue Lantus and NovoLog, continue gabapentin for neuropathy.  CBGs still a bit on the high side, increase Lantus and add mealtime NovoLog  CBG (last 3)  Recent Labs    11/15/19 2259 11/16/19 0735  GLUCAP 374* 347*    Hyperlipidemia -Continue Lipitor  Coronary artery disease, history of CABG -No chest pain, slightly elevated troponin but very low levels, flat, not in a pattern consistent with ACS.  Likely demand.  Presumed chronic diastolic CHF -Appears euvolemic, no access to prior echoes  Presumed chronic kidney disease stage IIIa -Creatinine appears at her baseline, currently stable this morning  Essential hypertension -Blood pressure still soft, hold lisinopril  Social  situation -Consult TOC, patient has out-of-state Medicaid and currently no good long-term living situation.  Scheduled Meds: . aspirin EC  81 mg Oral Daily  . atorvastatin  40 mg Oral Daily  . enoxaparin (LOVENOX) injection  40 mg Subcutaneous Q24H  . fenofibrate  54 mg Oral Daily  . gabapentin  800 mg Oral TID  . insulin aspart  0-5 Units Subcutaneous QHS  . insulin aspart  0-9 Units Subcutaneous TID WC  . insulin glargine  30 Units Subcutaneous QHS  . lamoTRIgine  100 mg Oral BID  . metFORMIN  500 mg Oral BID WC  . metoprolol tartrate  50 mg Oral Daily   Continuous Infusions: . piperacillin-tazobactam (ZOSYN)  IV 3.375 g (11/16/19 0536)  . vancomycin     PRN Meds:.acetaminophen **OR** acetaminophen, ondansetron **OR** ondansetron (ZOFRAN) IV  Diet Orders (From admission, onward)    Start     Ordered   11/16/19 0901  Diet Carb Modified Fluid consistency: Thin; Room service appropriate? Yes  Diet effective now       Question Answer Comment  Diet-HS Snack? Nothing   Calorie Level Medium 1600-2000   Fluid consistency: Thin   Room service appropriate? Yes      11/16/19 0900          DVT prophylaxis: enoxaparin (LOVENOX) injection 40 mg Start: 11/15/19 2200     Code Status: DNR  Family Communication: No family at bedside  Status is: Inpatient  Remains inpatient appropriate because:IV treatments appropriate due to intensity of illness or inability to take PO and Inpatient level of care appropriate due to severity of illness   Dispo: The patient is from: Home  Anticipated d/c is to: TBD              Anticipated d/c date is: 3 days              Patient currently is not medically stable to d/c.  Consultants:  Orthopedic surgery   Procedures:  None   Microbiology  Wound cultures-abundant GPC's Blood cultures-negative  Antimicrobials: Vancomycin, Zosyn, 11/10   Objective: Vitals:   11/16/19 0017 11/16/19 0025 11/16/19 0413 11/16/19 0908  BP:  (!) 142/74  (!) 84/60 101/62  Pulse: 66  64 74  Resp: 18  18 18   Temp: 98.6 F (37 C)  98 F (36.7 C) 98.2 F (36.8 C)  TempSrc:   Oral Oral  SpO2: 98%  98% 100%  Weight:  81.7 kg    Height:  5\' 2"  (1.575 m)      Intake/Output Summary (Last 24 hours) at 11/16/2019 1137 Last data filed at 11/16/2019 0900 Gross per 24 hour  Intake 240 ml  Output --  Net 240 ml   Filed Weights   11/15/19 1755 11/16/19 0025  Weight: 81.6 kg 81.7 kg    Examination:  Constitutional: NAD Eyes: no scleral icterus ENMT: Mucous membranes are moist.  Neck: normal, supple Respiratory: clear to auscultation bilaterally, no wheezing, no crackles.  Cardiovascular: Regular rate and rhythm, no murmurs / rubs / gallops. No LE edema. Good peripheral pulses Abdomen: non distended, no tenderness. Bowel sounds positive.  Musculoskeletal: no clubbing / cyanosis.  Skin: no rashes Neurologic: CN 2-12 grossly intact. Strength 5/5 in all 4.   Data Reviewed: I have independently reviewed following labs and imaging studies   CBC: Recent Labs  Lab 11/15/19 1430 11/16/19 0326  WBC 17.9* 15.9*  HGB 11.8* 11.4*  HCT 34.2* 34.7*  MCV 91.9 94.3  PLT 381 284   Basic Metabolic Panel: Recent Labs  Lab 11/15/19 1430 11/16/19 0326  NA 129* 130*  K 4.9 4.2  CL 91* 97*  CO2 26 24  GLUCOSE 334* 353*  BUN 14 17  CREATININE 1.32* 1.26*  CALCIUM 9.4 8.5*   Liver Function Tests: Recent Labs  Lab 11/15/19 1430 11/16/19 0326  AST 21 17  ALT 17 14  ALKPHOS 105 92  BILITOT 0.5 0.7  PROT 7.6 6.6  ALBUMIN 3.3* 2.8*   Coagulation Profile: No results for input(s): INR, PROTIME in the last 168 hours. HbA1C: Recent Labs    11/15/19 2000  HGBA1C 14.7*   CBG: Recent Labs  Lab 11/15/19 2259 11/16/19 0735  GLUCAP 374* 347*    Recent Results (from the past 240 hour(s))  Respiratory Panel by RT PCR (Flu A&B, Covid) - Nasopharyngeal Swab     Status: None   Collection Time: 11/15/19  4:33 PM    Specimen: Nasopharyngeal Swab  Result Value Ref Range Status   SARS Coronavirus 2 by RT PCR NEGATIVE NEGATIVE Final    Comment: (NOTE) SARS-CoV-2 target nucleic acids are NOT DETECTED.  The SARS-CoV-2 RNA is generally detectable in upper respiratoy specimens during the acute phase of infection. The lowest concentration of SARS-CoV-2 viral copies this assay can detect is 131 copies/mL. A negative result does not preclude SARS-Cov-2 infection and should not be used as the sole basis for treatment or other patient management decisions. A negative result may occur with  improper specimen collection/handling, submission of specimen other than nasopharyngeal swab, presence of viral mutation(s) within the areas targeted by this assay, and inadequate number of viral copies (<131 copies/mL). A  negative result must be combined with clinical observations, patient history, and epidemiological information. The expected result is Negative.  Fact Sheet for Patients:  PinkCheek.be  Fact Sheet for Healthcare Providers:  GravelBags.it  This test is no t yet approved or cleared by the [place] FDA and  has been authorized for detection and/or diagnosis of SARS-CoV-2 by FDA under an Emergency Use Authorization (EUA). This EUA will remain  in effect (meaning this test can be used) for the duration of the COVID-19 declaration under Section 564(b)(1) of the Act, 21 U.S.C. section 360bbb-3(b)(1), unless the authorization is terminated or revoked sooner.     Influenza A by PCR NEGATIVE NEGATIVE Final   Influenza B by PCR NEGATIVE NEGATIVE Final    Comment: (NOTE) The Xpert Xpress SARS-CoV-2/FLU/RSV assay is intended as an aid in  the diagnosis of influenza from Nasopharyngeal swab specimens and  should not be used as a sole basis for treatment. Nasal washings and  aspirates are unacceptable for Xpert Xpress SARS-CoV-2/FLU/RSV   testing.  Fact Sheet for Patients: PinkCheek.be  Fact Sheet for Healthcare Providers: GravelBags.it  This test is not yet approved or cleared by the [place] FDA and  has been authorized for detection and/or diagnosis of SARS-CoV-2 by  FDA under an Emergency Use Authorization (EUA). This EUA will remain  in effect (meaning this test can be used) for the duration of the  Covid-19 declaration under Section 564(b)(1) of the Act, 21  U.S.C. section 360bbb-3(b)(1), unless the authorization is  terminated or revoked. Performed at [facility], [place] [address] [place], [place] [address]   Culture, blood (routine x 2)     Status: None (Preliminary result)   Collection Time: 11/15/19  4:45 PM   Specimen: BLOOD  Result Value Ref Range Status   Specimen Description   Final    BLOOD SITE NOT SPECIFIED Performed at [facility], [place] [address], [place], [place] [address]    Special Requests   Final    BOTTLES DRAWN AEROBIC AND ANAEROBIC Blood Culture adequate volume Performed at [place] [address], [place], [place] [address]    Culture   Final    NO GROWTH < 12 HOURS Performed at [facility] [address], [place], [place] [address]    Report Status PENDING  Incomplete  Wound or Superficial Culture     Status: None (Preliminary result)   Collection Time: 11/15/19  4:56 PM   Specimen: Toe; Wound  Result Value Ref Range Status   Specimen Description   Final    TOE LEFT Performed at [facility] [address] [place], [place] [address]    Special Requests   Final    NONE Performed at [facility], [place] [address], [place], [place] [address]    Gram Stain   Final    RARE WBC PRESENT, PREDOMINANTLY PMN FEW GRAM POSITIVE COCCI    Culture   Final    ABUNDANT GRAM POSITIVE COCCI CULTURE REINCUBATED FOR BETTER  GROWTH Performed at [facility], [place] [address], [place], [place] [address]    Report Status PENDING  Incomplete     Radiology Studies: DG Chest Port 1 View  Result Date: 11/15/2019 CLINICAL DATA:  Fever, weakness. EXAM: PORTABLE CHEST 1 VIEW COMPARISON:  None. FINDINGS: The heart size and mediastinal contours are within normal limits. Both lungs are clear. The visualized skeletal structures are unremarkable. IMPRESSION: No active  disease. Electronically Signed   By: Marijo Conception M.D.   On: 11/15/2019 16:45   DG Foot Complete Left  Result Date: 11/15/2019 CLINICAL DATA:  59 year old female with diabetes and toe pain. Fever and chills. History of wound to the great toe. EXAM: LEFT FOOT - COMPLETE 3+ VIEW COMPARISON:  None. FINDINGS: There is resorption of the majority of the distal phalanx of the great toe, likely sequela of longstanding osteomyelitis. There is no acute fracture or dislocation. There is diffuse soft tissue swelling of the great toe. No radiopaque foreign object or soft tissue gas. IMPRESSION: 1. No acute fracture or dislocation. 2. Resorption of the majority of the distal phalanx of the great toe. 3. Diffuse soft tissue swelling of the great toe. Electronically Signed   By: Anner Crete M.D.   On: 11/15/2019 16:47    Marzetta Board, MD, PhD [facility]  Between 7 am - 7 pm I am available, please contact me via Amion or Securechat  Between 7 pm - 7 am I am not available, please contact night coverage MD/APP via Amion

## 2019-11-16 NOTE — Plan of Care (Signed)
  Problem: Education: Goal: Ability to describe self-care measures that may prevent or decrease complications (Diabetes Survival Skills Education) will improve 11/16/2019 0439 by Narda Amber, RN Outcome: Progressing 11/16/2019 0438 by Narda Amber, RN Outcome: Progressing 11/16/2019 0200 by Narda Amber, RN Outcome: Progressing 11/16/2019 0200 by Narda Amber, RN Outcome: Progressing

## 2019-11-16 NOTE — Consult Note (Signed)
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 female.  HPI: Has been dealing with swelling and a wound on the top of her hallux on the left foot for quite a while she says.  Is been treated by someone at outside hospital.  She had worsening swelling and drainage and therefore presented to the emergency department.She had some fevers and chills.  Does not have any systemic symptoms.  Denies significant pain but notes a burning type pain.  She denies any issues with her right foot.  She is unsure how long the swelling and wound has been present.  Upon presentation to the ER x-rays revealed evidence of bony destruction of the distal phalanx and possible erosion in the distal aspect of the proximal phalanx.  She was admitted to the hospital service for treatment of her cellulitis and underlying infection.  Orthopedics was consulted for evaluation.  She was placed on antibiotics.  Past Medical History:  Diagnosis Date  . Diabetes mellitus, type II ([place])   . Heart attack ([place])   . Heart failure ([place])   . Hyperlipidemia   . Hypertension   . Kidney disease     Past Surgical History:  Procedure Laterality Date  . TONSILLECTOMY      Family History  Problem Relation Age of Onset  . Diabetes Mother   . Hypertension Mother   . Heart failure Mother   . Kidney disease Mother   . Heart attack Mother   . Diabetes Father   . Hypertension Father   . Heart failure Father   . Kidney disease Father   . Heart attack Father   . Diabetes Sister   . Hypertension Sister   . Heart failure Sister   . Kidney disease Sister   . Heart attack Sister   . Diabetes Brother   . Hypertension Brother   . Heart failure Brother   . Kidney disease Brother   . Heart attack Brother     Social History:  reports that she has quit smoking. She has never used smokeless tobacco. She  reports that she does not drink alcohol and does not use drugs.  Allergies:  Allergies  Allergen Reactions  . Avandia [Rosiglitazone] Other (See Comments)    unk  . Niacin And Related Other (See Comments)    unk    Medications: I have reviewed the patient's current medications.  Results for orders placed or performed during the hospital encounter of 11/15/19 (from the past 48 hour(s))  Lipase, blood     Status: None   Collection Time: 11/15/19  2:30 PM  Result Value Ref Range   Lipase 26 11 - 51 U/L    Comment: Performed at [facility], [place] [address], [place], [place] [address]  Comprehensive metabolic panel     Status: Abnormal   Collection Time: 11/15/19  2:30 PM  Result Value Ref Range   Sodium 129 (L) 135 - 145 mmol/L   Potassium 4.9 3.5 - 5.1 mmol/L   Chloride 91 (L) 98 - 111 mmol/L   CO2 26 22 - 32 mmol/L   Glucose, Bld 334 (H) 70 - 99 mg/dL    Comment: Glucose reference range applies only to samples taken after fasting for at least 8 hours.   BUN 14 8 - 23 mg/dL   Creatinine, Ser 1.32 (H) 0.44 - 1.00 mg/dL   Calcium  9.4 8.9 - 10.3 mg/dL   Total Protein 7.6 6.5 - 8.1 g/dL   Albumin 3.3 (L) 3.5 - 5.0 g/dL   AST 21 15 - 41 U/L   ALT 17 0 - 44 U/L   Alkaline Phosphatase 105 38 - 126 U/L   Total Bilirubin 0.5 0.3 - 1.2 mg/dL   GFR, Estimated 46 (L) >60 mL/min    Comment: (NOTE) Calculated using the CKD-EPI Creatinine Equation (2021)    Anion gap 12 5 - 15    Comment: Performed at [facility], [place] [address], [place], [place] [address]  CBC     Status: Abnormal   Collection Time: 11/15/19  2:30 PM  Result Value Ref Range   WBC 17.9 (H) 4.0 - 10.5 K/uL   RBC 3.72 (L) 3.87 - 5.11 MIL/uL   Hemoglobin 11.8 (L) 12.0 - 15.0 g/dL   HCT 34.2 (L) 36 - 46 %   MCV 91.9 80.0 - 100.0 fL   MCH 31.7 26.0 - 34.0 pg   MCHC 34.5 30.0 - 36.0 g/dL   RDW 12.8 11.5 - 15.5 %   Platelets 381 150 - 400 K/uL   nRBC 0.0 0.0 - 0.2 %    Comment:  Performed at [facility], [place] [address] [place], [place] [address]  Respiratory Panel by RT PCR (Flu A&B, Covid) - Nasopharyngeal Swab     Status: None   Collection Time: 11/15/19  4:33 PM   Specimen: Nasopharyngeal Swab  Result Value Ref Range   SARS Coronavirus 2 by RT PCR NEGATIVE NEGATIVE    Comment: (NOTE) SARS-CoV-2 target nucleic acids are NOT DETECTED.  The SARS-CoV-2 RNA is generally detectable in upper respiratoy specimens during the acute phase of infection. The lowest concentration of SARS-CoV-2 viral copies this assay can detect is 131 copies/mL. A negative result does not preclude SARS-Cov-2 infection and should not be used as the sole basis for treatment or other patient management decisions. A negative result may occur with  improper specimen collection/handling, submission of specimen other than nasopharyngeal swab, presence of viral mutation(s) within the areas targeted by this assay, and inadequate number of viral copies (<131 copies/mL). A negative result must be combined with clinical observations, patient history, and epidemiological information. The expected result is Negative.  Fact Sheet for Patients:  PinkCheek.be  Fact Sheet for Healthcare Providers:  GravelBags.it  This test is no t yet approved or cleared by the [place] FDA and  has been authorized for detection and/or diagnosis of SARS-CoV-2 by FDA under an Emergency Use Authorization (EUA). This EUA will remain  in effect (meaning this test can be used) for the duration of the COVID-19 declaration under Section 564(b)(1) of the Act, 21 U.S.C. section 360bbb-3(b)(1), unless the authorization is terminated or revoked sooner.     Influenza A by PCR NEGATIVE NEGATIVE   Influenza B by PCR NEGATIVE NEGATIVE    Comment: (NOTE) The Xpert Xpress SARS-CoV-2/FLU/RSV assay is intended as an aid in  the diagnosis of  influenza from Nasopharyngeal swab specimens and  should not be used as a sole basis for treatment. Nasal washings and  aspirates are unacceptable for Xpert Xpress SARS-CoV-2/FLU/RSV  testing.  Fact Sheet for Patients: PinkCheek.be  Fact Sheet for Healthcare Providers: GravelBags.it  This test is not yet approved or cleared by the [place] FDA and  has been authorized for detection and/or diagnosis of SARS-CoV-2 by  FDA under an Emergency Use Authorization (EUA). This EUA will  remain  in effect (meaning this test can be used) for the duration of the  Covid-19 declaration under Section 564(b)(1) of the Act, 21  U.S.C. section 360bbb-3(b)(1), unless the authorization is  terminated or revoked. Performed at [facility], [place] [address] [place], [place] [address]   Lactic acid, plasma     Status: None   Collection Time: 11/15/19  4:33 PM  Result Value Ref Range   Lactic Acid, Venous 1.5 0.5 - 1.9 mmol/L    Comment: Performed at [facility], [place] [address], [place], [place] [address]  Culture, blood (routine x 2)     Status: None (Preliminary result)   Collection Time: 11/15/19  4:45 PM   Specimen: BLOOD  Result Value Ref Range   Specimen Description      BLOOD SITE NOT SPECIFIED Performed at [facility], [place] [address] [place] [address], [place], [place] [address]    Special Requests      BOTTLES DRAWN AEROBIC AND ANAEROBIC Blood Culture adequate volume Performed at [place] [address], [place], [place] [address]    Culture      NO GROWTH < 12 HOURS Performed at [place] [address] [place], [place] [address]    Report Status PENDING   Wound or Superficial Culture     Status: None (Preliminary result)   Collection Time: 11/15/19  4:56 PM   Specimen: Toe; Wound  Result Value Ref Range   Specimen Description      TOE LEFT Performed at [place] [address] [place], [place] [address]    Special Requests      NONE Performed at [facility], [place] [address], [place], [place] [address]    Gram Stain      RARE WBC PRESENT, PREDOMINANTLY PMN FEW GRAM POSITIVE COCCI    Culture      ABUNDANT GRAM POSITIVE COCCI CULTURE REINCUBATED FOR BETTER GROWTH Performed at [facility], [place] [address], [place], [place] [address]    Report Status PENDING   Hemoglobin A1c     Status: Abnormal   Collection Time: 11/15/19  8:00 PM  Result Value Ref Range   Hgb A1c MFr Bld 14.7 (H) 4.8 - 5.6 %    Comment: (NOTE) Pre diabetes:          5.7%-6.4%  Diabetes:              >6.4%  Glycemic control for   <7.0% adults with diabetes    Mean Plasma Glucose 375.19 mg/dL    Comment: Performed at [place] [address], [place], [place] [address]  CBG monitoring, ED     Status: Abnormal   Collection Time: 11/15/19 10:59 PM  Result Value Ref Range   Glucose-Capillary 374 (H) 70 - 99 mg/dL    Comment: Glucose reference range applies only to samples taken after fasting for at least 8 hours.  HIV Antibody (routine testing w rflx)     Status: None   Collection Time: 11/16/19  3:26 AM  Result Value Ref Range   HIV Screen 4th Generation wRfx Non Reactive Non Reactive    Comment: Performed at [facility], [place] [address] [place], [place] [address]  Comprehensive metabolic panel     Status: Abnormal   Collection Time: 11/16/19  3:26 AM  Result Value Ref Range   Sodium 130 (L) 135 - 145 mmol/L  Potassium 4.2 3.5 - 5.1 mmol/L   Chloride 97 (L) 98 - 111 mmol/L   CO2 24 22 - 32 mmol/L   Glucose, Bld 353 (H) 70 - 99 mg/dL    Comment: Glucose reference range applies only to samples taken after fasting for at least 8 hours.   BUN 17 8 - 23 mg/dL   Creatinine, Ser 1.26 (H) 0.44 - 1.00 mg/dL   Calcium 8.5 (L) 8.9 - 10.3 mg/dL   Total Protein 6.6 6.5 - 8.1 g/dL   Albumin 2.8 (L) 3.5 -  5.0 g/dL   AST 17 15 - 41 U/L   ALT 14 0 - 44 U/L   Alkaline Phosphatase 92 38 - 126 U/L   Total Bilirubin 0.7 0.3 - 1.2 mg/dL   GFR, Estimated 49 (L) >60 mL/min    Comment: (NOTE) Calculated using the CKD-EPI Creatinine Equation (2021)    Anion gap 9 5 - 15    Comment: Performed at [facility], [place] [address], [place], [place] [address]  CBC     Status: Abnormal   Collection Time: 11/16/19  3:26 AM  Result Value Ref Range   WBC 15.9 (H) 4.0 - 10.5 K/uL   RBC 3.68 (L) 3.87 - 5.11 MIL/uL   Hemoglobin 11.4 (L) 12.0 - 15.0 g/dL   HCT 34.7 (L) 36 - 46 %   MCV 94.3 80.0 - 100.0 fL   MCH 31.0 26.0 - 34.0 pg   MCHC 32.9 30.0 - 36.0 g/dL   RDW 13.0 11.5 - 15.5 %   Platelets 390 150 - 400 K/uL   nRBC 0.0 0.0 - 0.2 %    Comment: Performed at [facility], [place] [address], [place], [place] [address]  Glucose, capillary     Status: Abnormal   Collection Time: 11/16/19  7:35 AM  Result Value Ref Range   Glucose-Capillary 347 (H) 70 - 99 mg/dL    Comment: Glucose reference range applies only to samples taken after fasting for at least 8 hours.    DG Chest Port 1 View  Result Date: 11/15/2019 CLINICAL DATA:  Fever, weakness. EXAM: PORTABLE CHEST 1 VIEW COMPARISON:  None. FINDINGS: The heart size and mediastinal contours are within normal limits. Both lungs are clear. The visualized skeletal structures are unremarkable. IMPRESSION: No active disease. Electronically Signed   By: Marijo Conception M.D.   On: 11/15/2019 16:45   DG Foot Complete Left  Result Date: 11/15/2019 CLINICAL DATA:  59 year old female with diabetes and toe pain. Fever and chills. History of wound to the great toe. EXAM: LEFT FOOT - COMPLETE 3+ VIEW COMPARISON:  None. FINDINGS: There is resorption of the majority of the distal phalanx of the great toe, likely sequela of longstanding osteomyelitis. There is no acute fracture or dislocation. There is diffuse soft tissue swelling of the  great toe. No radiopaque foreign object or soft tissue gas. IMPRESSION: 1. No acute fracture or dislocation. 2. Resorption of the majority of the distal phalanx of the great toe. 3. Diffuse soft tissue swelling of the great toe. Electronically Signed   By: Anner Crete M.D.   On: 11/15/2019 16:47    Review of Systems  Constitutional: Positive for chills and fever.  HENT: Negative.   Respiratory: Negative.   Cardiovascular: Negative.   Gastrointestinal: Negative.   Musculoskeletal:       Left foot swelling  Skin: Positive for wound.  Neurological: Positive for numbness.  Psychiatric/Behavioral: Negative.  Blood pressure 101/62, pulse 74, temperature 98.2 F (36.8 C), temperature source Oral, resp. rate 18, height 5\' 2"  (1.575 m), weight 81.7 kg, SpO2 100 %. Physical Exam HENT:     Head: Normocephalic.     Mouth/Throat:     Mouth: Mucous membranes are moist.  Eyes:     Extraocular Movements: Extraocular movements intact.  Cardiovascular:     Rate and Rhythm: Normal rate.  Pulmonary:     Effort: Pulmonary effort is normal.  Abdominal:     Palpations: Abdomen is soft.  Musculoskeletal:     Cervical back: Neck supple.     Comments: Left foot demonstrates swelling about the hallux.  She has a missing toenail.  No ulceration.  No drainage.  She has swelling and erythema that streaks up the medial foot.  She has swelling about the dorsal and medial aspect of the foot.  No significant tenderness to palpation.  On palpation of the plantar aspect of her foot she endorses pinprick type sensations.  Endorses absent sensation to the tip of the hallux.  Foot is warm and well-perfused.  Difficult to palpate pulses.  Skin:    General: Skin is warm.  Neurological:     General: No focal deficit present.     Mental Status: She is alert.  Psychiatric:        Mood and Affect: Mood normal.     Assessment/Plan: Patient has a left foot infection in the setting of diabetes.  There is no  ulceration.  She does have a significant amount of swelling and some bogginess to the tip of her hallux with x-ray evidence of osteomyelitis.  Given the extent of the erythema and swelling I think getting an MRI scan is reasonable for surgical planning purposes to ensure adequate bony resection is performed.  She will need surgery at some point once the MRI is complete.  Hopefully she will only require a tip amputation but if the osteomyelitis seems to be more diffuse we will discuss the findings with the patient and proceed as necessary.  For now continue antibiotics.  She is not septic and therefore there is no urgency to the surgery.  We will follow up with the MRI results once completed.   Erle Crocker 11/16/2019, 11:21 AM

## 2019-11-16 NOTE — Plan of Care (Signed)
  Problem: Education: Goal: Ability to describe self-care measures that may prevent or decrease complications (Diabetes Survival Skills Education) will improve 11/16/2019 0442 by Narda Amber, RN Outcome: Progressing 11/16/2019 0439 by Narda Amber, RN Outcome: Progressing 11/16/2019 0438 by Narda Amber, RN Outcome: Progressing 11/16/2019 0200 by Narda Amber, RN Outcome: Progressing 11/16/2019 0200 by Narda Amber, RN Outcome: Progressing

## 2019-11-17 ENCOUNTER — Encounter (HOSPITAL_COMMUNITY)
Admission: EM | Disposition: A | Discharge status: Home / Self Care | Payer: Self-pay | Source: Home / Self Care | Attending: Internal Medicine

## 2019-11-17 ENCOUNTER — Inpatient Hospital Stay (HOSPITAL_COMMUNITY): Payer: Medicaid - Out of State | Admitting: Anesthesiology

## 2019-11-17 DIAGNOSIS — I2510 Atherosclerotic heart disease of native coronary artery without angina pectoris: Secondary | ICD-10-CM | POA: Diagnosis not present

## 2019-11-17 DIAGNOSIS — I10 Essential (primary) hypertension: Secondary | ICD-10-CM | POA: Diagnosis not present

## 2019-11-17 DIAGNOSIS — E11621 Type 2 diabetes mellitus with foot ulcer: Secondary | ICD-10-CM | POA: Diagnosis not present

## 2019-11-17 DIAGNOSIS — M86172 Other acute osteomyelitis, left ankle and foot: Secondary | ICD-10-CM | POA: Diagnosis not present

## 2019-11-17 HISTORY — PX: AMPUTATION: SHX166

## 2019-11-17 LAB — GLUCOSE, CAPILLARY
Glucose-Capillary: 297 mg/dL — ABNORMAL HIGH (ref 70–99)
Glucose-Capillary: 209 mg/dL — ABNORMAL HIGH (ref 70–99)
Glucose-Capillary: 122 mg/dL — ABNORMAL HIGH (ref 70–99)
Glucose-Capillary: 134 mg/dL — ABNORMAL HIGH (ref 70–99)
Glucose-Capillary: 115 mg/dL — ABNORMAL HIGH (ref 70–99)
Glucose-Capillary: 249 mg/dL — ABNORMAL HIGH (ref 70–99)

## 2019-11-17 LAB — CBC
HCT: 31.1 % — ABNORMAL LOW (ref 36.0–46.0)
Hemoglobin: 10.4 g/dL — ABNORMAL LOW (ref 12.0–15.0)
MCH: 31.5 pg (ref 26.0–34.0)
MCHC: 33.4 g/dL (ref 30.0–36.0)
MCV: 94.2 fL (ref 80.0–100.0)
Platelets: 332 10*3/uL (ref 150–400)
RBC: 3.3 MIL/uL — ABNORMAL LOW (ref 3.87–5.11)
RDW: 13 % (ref 11.5–15.5)
WBC: 13.7 10*3/uL — ABNORMAL HIGH (ref 4.0–10.5)
nRBC: 0 % (ref 0.0–0.2)

## 2019-11-17 LAB — BASIC METABOLIC PANEL
Anion gap: 8 (ref 5–15)
BUN: 25 mg/dL — ABNORMAL HIGH (ref 8–23)
CO2: 26 mmol/L (ref 22–32)
Calcium: 8.3 mg/dL — ABNORMAL LOW (ref 8.9–10.3)
Chloride: 96 mmol/L — ABNORMAL LOW (ref 98–111)
Creatinine, Ser: 1.08 mg/dL — ABNORMAL HIGH (ref 0.44–1.00)
GFR, Estimated: 58 mL/min — ABNORMAL LOW (ref >60)
Glucose, Bld: 339 mg/dL — ABNORMAL HIGH (ref 70–99)
Potassium: 4.5 mmol/L (ref 3.5–5.1)
Sodium: 130 mmol/L — ABNORMAL LOW (ref 135–145)

## 2019-11-17 SURGERY — AMPUTATION DIGIT
Anesthesia: Monitor Anesthesia Care | Laterality: Left

## 2019-11-17 MED ORDER — FENTANYL CITRATE (PF) 100 MCG/2ML IJ SOLN
INTRAMUSCULAR | Status: DC | PRN
  Administered 2019-11-17: 25 ug via INTRAVENOUS

## 2019-11-17 MED ORDER — VANCOMYCIN HCL 1000 MG IV SOLR
INTRAVENOUS | Status: DC | PRN
  Administered 2019-11-17: 1000 mg via TOPICAL

## 2019-11-17 MED ORDER — FENTANYL CITRATE (PF) 100 MCG/2ML IJ SOLN
INTRAMUSCULAR | Status: AC
  Filled 2019-11-17: qty 2

## 2019-11-17 MED ORDER — CEFAZOLIN SODIUM-DEXTROSE 2-4 GM/100ML-% IV SOLN: 2.0000 g | INTRAVENOUS | Status: DC

## 2019-11-17 MED ORDER — PHENYLEPHRINE 40 MCG/ML (10ML) SYRINGE FOR IV PUSH (FOR BLOOD PRESSURE SUPPORT)
PREFILLED_SYRINGE | INTRAVENOUS | Status: AC
  Filled 2019-11-17: qty 10

## 2019-11-17 MED ORDER — PHENYLEPHRINE HCL (PRESSORS) 10 MG/ML IV SOLN
INTRAVENOUS | Status: DC | PRN
  Administered 2019-11-17: 80 ug via INTRAVENOUS

## 2019-11-17 MED ORDER — PROPOFOL 500 MG/50ML IV EMUL
INTRAVENOUS | Status: DC | PRN
  Administered 2019-11-17: 100 ug/kg/min via INTRAVENOUS

## 2019-11-17 MED ORDER — LIDOCAINE 2% (20 MG/ML) 5 ML SYRINGE
INTRAMUSCULAR | Status: AC
  Filled 2019-11-17: qty 5

## 2019-11-17 MED ORDER — FENTANYL CITRATE (PF) 100 MCG/2ML IJ SOLN
INTRAMUSCULAR | Status: AC
  Administered 2019-11-17: 50 ug
  Filled 2019-11-17: qty 2

## 2019-11-17 MED ORDER — LIDOCAINE-EPINEPHRINE (PF) 1.5 %-1:200000 IJ SOLN
INTRAMUSCULAR | Status: DC | PRN
  Administered 2019-11-17: 10 mL via PERINEURAL

## 2019-11-17 MED ORDER — SODIUM CHLORIDE 0.9 % IR SOLN
Status: DC | PRN
  Administered 2019-11-17: 1000 mL

## 2019-11-17 MED ORDER — OXYCODONE HCL 5 MG/5ML PO SOLN: 5.0000 mg | Freq: Once | ORAL | Status: DC | PRN

## 2019-11-17 MED ORDER — ACETAMINOPHEN 160 MG/5ML PO SOLN: 1000.0000 mg | Freq: Once | ORAL | Status: DC | PRN

## 2019-11-17 MED ORDER — ACETAMINOPHEN 10 MG/ML IV SOLN: 1000.0000 mg | Freq: Once | INTRAVENOUS | Status: DC | PRN

## 2019-11-17 MED ORDER — BUPIVACAINE-EPINEPHRINE (PF) 0.5% -1:200000 IJ SOLN
INTRAMUSCULAR | Status: DC | PRN
  Administered 2019-11-17: 20 mL via PERINEURAL

## 2019-11-17 MED ORDER — ACETAMINOPHEN 500 MG PO TABS: 1000.0000 mg | ORAL_TABLET | Freq: Once | ORAL | Status: DC | PRN

## 2019-11-17 MED ORDER — CEFAZOLIN SODIUM-DEXTROSE 2-4 GM/100ML-% IV SOLN
INTRAVENOUS | Status: AC
  Filled 2019-11-17: qty 100

## 2019-11-17 MED ORDER — LIDOCAINE 2% (20 MG/ML) 5 ML SYRINGE
INTRAMUSCULAR | Status: DC | PRN
  Administered 2019-11-17: 80 mg via INTRAVENOUS

## 2019-11-17 MED ORDER — OXYCODONE HCL 5 MG PO TABS: 5.0000 mg | ORAL_TABLET | Freq: Once | ORAL | Status: DC | PRN

## 2019-11-17 MED ORDER — 0.9 % SODIUM CHLORIDE (POUR BTL) OPTIME
TOPICAL | Status: DC | PRN
  Administered 2019-11-17: 1000 mL

## 2019-11-17 MED ORDER — MIDAZOLAM HCL 2 MG/2ML IJ SOLN
INTRAMUSCULAR | Status: AC
  Filled 2019-11-17: qty 2

## 2019-11-17 MED ORDER — ONDANSETRON HCL 4 MG/2ML IJ SOLN
INTRAMUSCULAR | Status: DC | PRN
  Administered 2019-11-17: 4 mg via INTRAVENOUS

## 2019-11-17 MED ORDER — ONDANSETRON HCL 4 MG/2ML IJ SOLN
INTRAMUSCULAR | Status: AC
  Filled 2019-11-17: qty 2

## 2019-11-17 MED ORDER — LACTATED RINGERS IV SOLN: INTRAVENOUS | Status: DC

## 2019-11-17 MED ORDER — VANCOMYCIN HCL 1000 MG IV SOLR
INTRAVENOUS | Status: AC
  Filled 2019-11-17: qty 1000

## 2019-11-17 MED ORDER — FENTANYL CITRATE (PF) 100 MCG/2ML IJ SOLN: 25.0000 ug | INTRAMUSCULAR | Status: DC | PRN

## 2019-11-17 MED ORDER — PROPOFOL 500 MG/50ML IV EMUL
INTRAVENOUS | Status: AC
  Filled 2019-11-17: qty 50

## 2019-11-17 SURGICAL SUPPLY — 35 items
BNDG ELASTIC 4X5.8 VLCR STR LF (GAUZE/BANDAGES/DRESSINGS) ×2 IMPLANT
BNDG ELASTIC 6X5.8 VLCR STR LF (GAUZE/BANDAGES/DRESSINGS) ×2 IMPLANT
BNDG ESMARK 4X9 LF (GAUZE/BANDAGES/DRESSINGS) IMPLANT
BNDG GAUZE ELAST 4 BULKY (GAUZE/BANDAGES/DRESSINGS) ×2 IMPLANT
CNTNR URN SCR LID CUP LEK RST (MISCELLANEOUS) ×1 IMPLANT
CONT SPEC 4OZ STRL OR WHT (MISCELLANEOUS) ×1
COVER SURGICAL LIGHT HANDLE (MISCELLANEOUS) ×2 IMPLANT
COVER WAND RF STERILE (DRAPES) IMPLANT
DECANTER SPIKE VIAL GLASS SM (MISCELLANEOUS) ×2 IMPLANT
DURAPREP 26ML APPLICATOR (WOUND CARE) IMPLANT
ELECT PENCIL ROCKER SW 15FT (MISCELLANEOUS) ×2 IMPLANT
GAUZE SPONGE 4X4 12PLY STRL (GAUZE/BANDAGES/DRESSINGS) ×2 IMPLANT
GAUZE XEROFORM 1X8 LF (GAUZE/BANDAGES/DRESSINGS) ×2 IMPLANT
GLOVE BIOGEL M STRL SZ7.5 (GLOVE) ×2 IMPLANT
GLOVE INDICATOR 8.0 STRL GRN (GLOVE) ×2 IMPLANT
GOWN STRL REUS W/TWL XL LVL3 (GOWN DISPOSABLE) ×4 IMPLANT
KIT BASIN OR (CUSTOM PROCEDURE TRAY) ×2 IMPLANT
KIT TURNOVER KIT A (KITS) IMPLANT
MANIFOLD NEPTUNE II (INSTRUMENTS) ×2 IMPLANT
MARKER SKIN DUAL TIP RULER LAB (MISCELLANEOUS) ×2 IMPLANT
NEEDLE HYPO 25X1 1.5 SAFETY (NEEDLE) IMPLANT
NS IRRIG 1000ML POUR BTL (IV SOLUTION) ×2 IMPLANT
PACK ORTHO EXTREMITY (CUSTOM PROCEDURE TRAY) ×2 IMPLANT
PAD CAST 4YDX4 CTTN HI CHSV (CAST SUPPLIES) ×1 IMPLANT
PADDING CAST COTTON 4X4 STRL (CAST SUPPLIES) ×1
PADDING UNDERCAST 2 STRL (CAST SUPPLIES)
PADDING UNDERCAST 2X4 STRL (CAST SUPPLIES) IMPLANT
SUT ETHILON 3 0 PS 1 (SUTURE) ×2 IMPLANT
SYR 20ML LL LF (SYRINGE) IMPLANT
SYR CONTROL 10ML LL (SYRINGE) IMPLANT
TOWEL OR 17X26 10 PK STRL BLUE (TOWEL DISPOSABLE) ×2 IMPLANT
TOWEL OR NON WOVEN STRL DISP B (DISPOSABLE) ×2 IMPLANT
TRAY PREP A LATEX SAFE STRL (SET/KITS/TRAYS/PACK) ×2 IMPLANT
UNDERPAD 30X36 HEAVY ABSORB (UNDERPADS AND DIAPERS) ×2 IMPLANT
YANKAUER SUCT BULB TIP NO VENT (SUCTIONS) ×2 IMPLANT

## 2019-11-17 NOTE — Op Note (Addendum)
Pamela Knight female 59 y.o. 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 Left forefoot deep abscess with tenosynovitis  PostOperative Diagnosis: Same  PROCEDURE: Left hallux MTP joint disarticulation Incision and drainage of left forefoot abscess, multiple bursal spaces Excisional debridement of plantar forefoot wound, deep including epidermis, dermis, fascial tissue and muscle 4 x 4 cm  SURGEON: Melony Overly, MD  ASSISTANT: None  ANESTHESIA: MAC with peripheral nerve blockade  FINDINGS: Abscess and bony erosion of the distal phalanx and proximal phalanx of the hallux Deep multiple bursal space abscess with tenosynovitis within the flexor hallucis longus tendon sheath  IMPLANTS: None  INDICATIONS:59 y.o. female with uncontrolled diabetes had longstanding foot swelling and pain.  She presented to emergency department where x-rays revealed bony destruction of the distal phalanx concerning for osteomyelitis.  She had drainage of the wound on the lateral aspect of her hallux near the level of the interphalangeal joint.  MRI scan revealed osteomyelitis of the distal phalanx and proximal phalanx as well as deep abscess within the forefoot.  Patient was indicated for hallux amputation and incision and drainage of the abscess.   Patient understood the risks, benefits and alternatives to surgery which include but are not limited to wound healing complications, infection, nonunion, malunion, need for further surgery as well as damage to surrounding structures. They also understood the potential for continued pain in that there were no guarantees of acceptable outcome After weighing these risks the patient opted to proceed with surgery.  PROCEDURE: Patient was identified in the preoperative holding area.  The left foot was marked by myself.  Consent was signed by myself and the patient.  Block was performed by anesthesia in the  preoperative holding area.  Patient was taken to the operative suite and placed supine on the operative table.  MAC  anesthesia was induced without difficulty. Bump was placed under the operative hip.  All bony prominences were well padded.  Right foot and leg was prepped and draped in usual sterile fashion.  Surgical timeout was performed.   We began by making an incision circumferentially around the hallux through apparently viable skin.  This taken sharply down through skin all with the bone circumferentially.  Then it was noted that there was significant mount of purulent drainage through the abscess and through the tissues.  Therefore the incision was made more proximally extending from the medial ray and a racquet fashion laterally around the hallux.  Then the hallux was disarticulated at the MTP joint and removed.  Then it was noted that there is significant purulent drainage and abscess within the plantar forefoot within multiple bursal spaces as well as the flexor houses longus tendon.  The tendon was retracted and transected in a traction tenotomy fashion.  Dissection scissors was used to deloculated multiple bursal spaces of abscess within the forefoot soft tissue.  Then using sharp dissection scissors and a 15 blade excisional debridement was performed of the deep nonviable soft tissues within the fat pad that remained.  Approximate amount of debrided tissue was 4 x 4 cm.  There was excisional debridement performed of skin, subcutaneous tissue, fascial tissue, muscle and tendon.  Deep tissue that was debrided was removed and sent for culture including the hallux amputated toe.  Once adequate excisional debridement and drainage of the abscess cavities were performed 3 L of normal saline were irrigated through the area.  Then the tissue was reinspected and further debrided excisionally with dissection scissors and 15  blade sharply.  Once adequate debridement had been performed it was further  irrigated.  Then vancomycin powder was placed within the wound.  The wound was then closed in a tension-free fashion with 3-0 nylon suture.  Bolstered soft dressing was placed about the foot.  Patient was then awakened from anesthesia and taken recovery in stable condition.  She tolerated seizure well.  There are no complications.  POST OPERATIVE INSTRUCTIONS: Heel weightbearing in a postoperative shoe Keep dressing in place for at least 1 week. She will follow-up in 2 weeks for wound check. Sutures to remain at least 4 weeks given her uncontrolled diabetic status. All cellulitic tissue was not removed and therefore would recommend continued antibiotics until resolution of cellulitis.  Antibiotics treatment based on cultures.  TOURNIQUET TIME: No tourniquet was used  BLOOD LOSS:  20cc         DRAINS: none         SPECIMEN: none       COMPLICATIONS:  * No complications entered in OR log *         Disposition: PACU - hemodynamically stable.         Condition: stable

## 2019-11-17 NOTE — Transfer of Care (Signed)
Immediate Anesthesia Transfer of Care Note  Patient: Pamela Knight  Procedure(s) Performed: AMPUTATION LEFT GREAT TOE (Left )  Patient Location: PACU  Anesthesia Type:MAC  Level of Consciousness: awake, alert , oriented and patient cooperative  Airway & Oxygen Therapy: Patient Spontanous Breathing and Patient connected to face mask oxygen  Post-op Assessment: Report given to RN, Post -op Vital signs reviewed and stable and Patient moving all extremities  Post vital signs: Reviewed and stable  Last Vitals:  Vitals Value Taken Time  BP 128/68 11/17/19 1603  Temp    Pulse 96 11/17/19 1605  Resp 18 11/17/19 1605  SpO2 100 % 11/17/19 1605  Vitals shown include unvalidated device data.  Last Pain:  Vitals:   11/17/19 1505  TempSrc:   PainSc: 3       Patients Stated Pain Goal: 3 (35/68/61 6837)  Complications: No complications documented.

## 2019-11-17 NOTE — TOC Initial Note (Addendum)
Transition of Care [facility]) - Initial/Assessment Note    Patient Details  Name: Pamela Knight MRN: [id number] Date of Birth: [birth year]  Transition of Care [facility]) CM/SW Contact:    Lia Hopping, [place] Phone Number: 11/17/2019, 8:48 AM  Clinical Narrative:    Re: Living situation Admitted for Weakness, fever and chills.  Osteomyelitis of the great toe. Awaiting Surgery.             CSW met with the patient at bedside to discuss her living situation. Patient reports she relocated from [place] to [place] about two months ago for a "new start." Patient express family stressors with her daughters current incarceration and department of social services involvement with her grandchildren. Patient vague about why her daughter is incarcerated.  Patient reports subsequently  she was evicted from her section 8 housing and has temporarily been living in a hotel.  She has found support in her friend Raechel Chute that lives in the area. She reports Jackelyn Poling has been supportive in assisting her with transferring her [place] [employer] to a [employer]. Patient reports she has been trying to reinstate her section 8 housing here in [place].  Patient plans to stay with a friend at discharge if she continues to ambulate well without support after surgery.   CSW will arrange provide resources for [facility] [place] [facility] clinic. Patient pharmacy will not accept out of state medicaid. Patient reports she has to fill her prescriptions at a pharmacy in [place]. She report on a few occasions friends have taken her.   TOC staff will continue to follow this patient.    Barriers to Discharge: Continued Medical Work up   Patient Goals and CMS Choice   CMS Medicare.gov Compare Post Acute Care list provided to:: Patient Choice offered to / list presented to : Patient  Expected Discharge Plan and Services   In-house Referral: Clinical Social Work Discharge Planning Services: CM Consult Post Acute Care Choice:  [place] Living arrangements for the past 2 months: Homeless, [place]                                      Prior Living Arrangements/Services Living arrangements for the past 2 months: Homeless, [place] Lives with:: Self Patient language and need for interpreter reviewed:: No        Need for Family Participation in Patient Care: Yes (Comment) Care giver support system in place?: Yes (comment)   Criminal Activity/Legal Involvement Pertinent to Current Situation/Hospitalization: No - Comment as needed  Activities of Daily Living Home Assistive Devices/Equipment: Cane (specify quad or straight) ADL Screening (condition at time of admission) Patient's cognitive ability adequate to safely complete daily activities?: Yes Is the patient deaf or have difficulty hearing?: No Does the patient have difficulty seeing, even when wearing glasses/contacts?: No Does the patient have difficulty concentrating, remembering, or making decisions?: No Patient able to express need for assistance with ADLs?: Yes Does the patient have difficulty dressing or bathing?: No Independently performs ADLs?: Yes (appropriate for developmental age) Does the patient have difficulty walking or climbing stairs?: Yes Weakness of Legs: Both Weakness of Arms/Hands: Both  Permission Sought/Granted   Permission granted to share information with : Yes, Verbal Permission Granted              Emotional Assessment Appearance:: Appears stated age Attitude/Demeanor/Rapport: Engaged Affect (typically observed): Accepting Orientation: : Oriented to  Self, Oriented to Place, Oriented to  Time, Oriented to Situation Alcohol / Substance Use: Not Applicable Psych Involvement: No (comment)  Admission diagnosis:  Osteomyelitis ([place]) [M86.9] AKI (acute kidney injury) ([place]) [N17.9] Acute osteomyelitis of toe of left foot [facility]) [F84.037] Patient Active Problem List   Diagnosis Date  Noted  . Osteomyelitis ([place]) 11/15/2019  . Personal history of noncompliance with medical treatment, presenting hazards to health 12/26/2015  . Uncontrolled type 2 diabetes mellitus with stage 2 chronic kidney disease, with long-term current use of insulin ([place]) 12/18/2015  . Essential hypertension, benign 12/18/2015  . Hypercholesteremia 12/18/2015  . DM type 2 causing vascular disease ([place]) 12/18/2015   PCP:  Patient, No Pcp Per Pharmacy:   [id number] [place] [id number]: [phone]: [phone]    Social Determinants of Health (SDOH) Interventions    Readmission Risk Interventions No flowsheet data found.

## 2019-11-17 NOTE — Plan of Care (Signed)
  Problem: Education: Goal: Ability to describe self-care measures that may prevent or decrease complications (Diabetes Survival Skills Education) will improve Outcome: Progressing   Problem: Coping: Goal: Ability to adjust to condition or change in health will improve Outcome: Progressing   Problem: Fluid Volume: Goal: Ability to maintain a balanced intake and output will improve Outcome: Progressing   Problem: Health Behavior/Discharge Planning: Goal: Ability to identify and utilize available resources and services will improve Outcome: Progressing Goal: Ability to manage health-related needs will improve Outcome: Progressing   Problem: Nutritional: Goal: Maintenance of adequate nutrition will improve Outcome: Progressing Goal: Progress toward achieving an optimal weight will improve Outcome: Progressing   Problem: Skin Integrity: Goal: Risk for impaired skin integrity will decrease Outcome: Progressing   Problem: Skin Integrity: Goal: Risk for impaired skin integrity will decrease Outcome: Progressing

## 2019-11-17 NOTE — Anesthesia Preprocedure Evaluation (Addendum)
Anesthesia Evaluation  Patient identified by MRN, date of birth, ID band Patient awake    Reviewed: Allergy & Precautions, NPO status , Patient's Chart, lab work & pertinent test results  History of Anesthesia Complications Negative for: history of anesthetic complications  Airway Mallampati: III  TM Distance: >3 FB Neck ROM: Full    Dental  (+) Dental Advisory Given   Pulmonary neg shortness of breath, neg sleep apnea, neg COPD, neg recent URI, former smoker,  Covid-19 Nucleic Acid Test Results Lab Results      Component                Value               Date                      SARSCOV2NAA              NEGATIVE            11/15/2019              breath sounds clear to auscultation       Cardiovascular hypertension, Pt. on medications and Pt. on home beta blockers (-) angina+ Past MI and + CABG   Rhythm:Regular  ECG: SR, rate 93   Neuro/Psych negative neurological ROS  negative psych ROS   GI/Hepatic negative GI ROS, Neg liver ROS,   Endo/Other  diabetes, Insulin Dependent  Renal/GU Renal InsufficiencyRenal diseaseLab Results      Component                Value               Date                      CREATININE               1.08 (H)            11/17/2019                Musculoskeletal negative musculoskeletal ROS (+)   Abdominal (+) + obese,   Peds  Hematology  (+) Blood dyscrasia, anemia , HLD  Lab Results      Component                Value               Date                      WBC                      13.7 (H)            11/17/2019                HGB                      10.4 (L)            11/17/2019                HCT                      31.1 (L)            11/17/2019                MCV  94.2                11/17/2019                PLT                      332                 11/17/2019              Anesthesia Other Findings infected left great toe   Reproductive/Obstetrics                            Anesthesia Physical Anesthesia Plan  ASA: III  Anesthesia Plan: MAC and Regional   Post-op Pain Management:    Induction: Intravenous  PONV Risk Score and Plan: 2 and Treatment may vary due to age or medical condition and Propofol infusion  Airway Management Planned: Nasal Cannula  Additional Equipment: None  Intra-op Plan:   Post-operative Plan:   Informed Consent: I have reviewed the patients History and Physical, chart, labs and discussed the procedure including the risks, benefits and alternatives for the proposed anesthesia with the patient or authorized representative who has indicated his/her understanding and acceptance.     Dental advisory given  Plan Discussed with: CRNA and Surgeon  Anesthesia Plan Comments:         Anesthesia Quick Evaluation

## 2019-11-17 NOTE — Progress Notes (Signed)
Pamela Knight, Pamela Knight, Pamela Knight   LOS: 2 days   Brief Narrative / Interim history: 59 year old female with CAD, history of CABG, CHF, DM 2, HTN, HLD, depression who came into the hospital with generalized weakness over the last several weeks, intermittent fever or chills.  She used to live in [place], section 8 housing, however apparently lost that and moved to [place].  Currently living in a motel.  Reports that her left great toe has had a wound which is progressively getting worse with increased pain and increased drainage.  Imaging in the ER showed osteomyelitis  Subjective / 24h Interval events: Sleeping, wakes up easily, Pamela significant complaints.  Awaiting surgery this morning  Assessment & Plan: Principal Problem Osteomyelitis of the left great toe -Knight was placed on broad-spectrum antibiotics, cultures were sent and pending.  So far culture shows abundant gram-positive cocci.  Orthopedic surgery was consulted, Knight underwent an MRI yesterday which confirmed osteomyelitis.  Orthopedic surgery will take Knight to the operating room today for toe amputation  Active Problems Type 2 diabetes mellitus, insulin-dependent -Continue Lantus and NovoLog, continue gabapentin for neuropathy.  CBGs still a bit on the high side, increase Lantus and add mealtime NovoLog -CBGs still elevated, she is n.p.o. this morning and will avoid doing further changes at this point  CBG (last 3)  Recent Labs    11/16/19 1628 11/16/19 2022 11/17/19 0741  GLUCAP 438* 312* 297*    Hyperlipidemia -Continue Lipitor  Coronary artery disease, history of CABG -Pamela chest pain, slightly elevated troponin but very low levels, flat, not in a pattern consistent with ACS.  Likely demand.  Presumed chronic diastolic CHF -Appears euvolemic, Pamela access to prior echoes  Presumed chronic kidney disease stage IIIa -Creatinine  appears at her baseline, currently stable this morning  Essential hypertension -Blood pressure still soft, hold lisinopril  Social situation -Consult TOC, Knight has out-of-state Medicaid and currently Pamela good long-term living situation.  Scheduled Meds: . [MAR Hold] aspirin EC  81 mg Oral Daily  . [MAR Hold] atorvastatin  40 mg Oral Daily  . [MAR Hold] enoxaparin (LOVENOX) injection  40 mg Subcutaneous Q24H  . [MAR Hold] feeding supplement (GLUCERNA SHAKE)  237 mL Oral TID BM  . [MAR Hold] fenofibrate  54 mg Oral Daily  . [MAR Hold] gabapentin  800 mg Oral TID  . [MAR Hold] insulin aspart  0-15 Units Subcutaneous TID WC  . [MAR Hold] insulin aspart  0-5 Units Subcutaneous QHS  . [MAR Hold] insulin aspart  3 Units Subcutaneous TID WC  . [MAR Hold] insulin glargine  40 Units Subcutaneous QHS  . [MAR Hold] lamoTRIgine  100 mg Oral BID  . [MAR Hold] metFORMIN  500 mg Oral BID WC  . [MAR Hold] metoprolol tartrate  50 mg Oral Daily  . [MAR Hold] multivitamin with minerals  1 tablet Oral Daily   Continuous Infusions: .  ceFAZolin (ANCEF) IV    . [MAR Hold] piperacillin-tazobactam (ZOSYN)  IV Stopped (11/17/19 0544)  . [MAR Hold] vancomycin Stopped (11/16/19 2227)   PRN Meds:.[MAR Hold] acetaminophen **OR** [MAR Hold] acetaminophen, [MAR Hold] ondansetron **OR** [MAR Hold] ondansetron (ZOFRAN) IV  Diet Orders (From admission, onward)    Start     Ordered   11/17/19 0707  Diet NPO time specified Except for: Sips with Meds  Diet effective now       Question:  Except for  Answer:  Ferrel Logan  with Meds   11/17/19 0706          DVT prophylaxis: enoxaparin (LOVENOX) injection 40 mg Start: 11/15/19 2200     Code Status: DNR  Family Communication: Pamela family at bedside  Status is: Inpatient  Remains inpatient appropriate because:IV treatments appropriate due to intensity of illness or inability to take PO and Inpatient level of care appropriate due to severity of illness   Dispo:  The Knight is from: Home              Anticipated d/c is to: TBD              Anticipated d/c date is: 3 days              Knight currently is not medically stable to d/c.  Consultants:  Orthopedic surgery   Procedures:  None   Microbiology  Wound cultures-abundant GPC's Blood cultures-negative  Antimicrobials: Vancomycin, Zosyn, 11/10   Objective: Vitals:   11/16/19 0908 11/16/19 1343 11/16/19 2025 11/17/19 0549  BP: 101/62 102/68 (!) 105/54 108/66  Pulse: 74 75 88 77  Resp: 18 18 18  (!) 22  Temp: 98.2 F (36.8 C) 98.9 F (37.2 C) 100.1 F (37.8 C) 98.9 F (37.2 C)  TempSrc: Oral Oral Oral   SpO2: 100% 100% 98% 98%  Weight:      Height:        Intake/Output Summary (Last 24 hours) at 11/17/2019 1003 Last data filed at 11/17/2019 0600 Gross Knight 24 hour  Intake 547.26 ml  Output 250 ml  Net 297.26 ml   Filed Weights   11/15/19 1755 11/16/19 0025  Weight: 81.6 kg 81.7 kg    Examination:  Constitutional: Pamela distress, in bed Eyes: Pamela scleral icterus ENMT: mmm Neck: normal, supple Respiratory: Clear bilaterally, Pamela wheezing, Pamela crackles Cardiovascular: Regular rate and rhythm, Pamela murmurs, Pamela edema, good pulses Abdomen: Soft, nontender, nondistended, bowel sounds positive Musculoskeletal: Pamela clubbing / cyanosis.  Skin: Pamela rashes seen Neurologic: Pamela focal deficits  Data Reviewed: I have independently reviewed following labs and imaging studies   CBC: Recent Labs  Lab 11/15/19 1430 11/16/19 0326 11/17/19 0337  WBC 17.9* 15.9* 13.7*  HGB 11.8* 11.4* 10.4*  HCT 34.2* 34.7* 31.1*  MCV 91.9 94.3 94.2  PLT 381 390 932   Basic Metabolic Panel: Recent Labs  Lab 11/15/19 1430 11/16/19 0326 11/16/19 1311 11/17/19 0337  NA 129* 130*  --  130*  K 4.9 4.2  --  4.5  CL 91* 97*  --  96*  CO2 26 24  --  26  GLUCOSE 334* 353* 512* 339*  BUN 14 17  --  25*  CREATININE 1.32* 1.26*  --  1.08*  CALCIUM 9.4 8.5*  --  8.3*   Liver Function Tests: Recent  Labs  Lab 11/15/19 1430 11/16/19 0326  AST 21 17  ALT 17 14  ALKPHOS 105 92  BILITOT 0.5 0.7  PROT 7.6 6.6  ALBUMIN 3.3* 2.8*   Coagulation Profile: Pamela results for input(s): INR, PROTIME in the last 168 hours. HbA1C: Recent Labs    11/15/19 2000  HGBA1C 14.7*   CBG: Recent Labs  Lab 11/16/19 0735 11/16/19 1228 11/16/19 1628 11/16/19 2022 11/17/19 0741  GLUCAP 347* 509* 438* 312* 297*    Recent Results (from the past 240 hour(s))  Respiratory Panel by RT PCR (Flu A&B, Covid) - Nasopharyngeal Swab     Status: None   Collection Time: 11/15/19  4:33 PM  Specimen: Nasopharyngeal Swab  Result Value Ref Range Status   SARS Coronavirus 2 by RT PCR NEGATIVE NEGATIVE Final    Comment: (NOTE) SARS-CoV-2 target nucleic acids are NOT DETECTED.  The SARS-CoV-2 RNA is generally detectable in upper respiratoy specimens during the acute phase of infection. The lowest concentration of SARS-CoV-2 viral copies this assay can detect is 131 copies/mL. A negative result does not preclude SARS-Cov-2 infection and should not be used as the sole basis for treatment or other Knight management decisions. A negative result may occur with  improper specimen collection/handling, submission of specimen other than nasopharyngeal swab, presence of viral mutation(s) within the areas targeted by this assay, and inadequate number of viral copies (<131 copies/mL). A negative result must be combined with clinical observations, Knight history, and epidemiological information. The expected result is Negative.  Fact Sheet for Patients:  PinkCheek.be  Fact Sheet for Healthcare Providers:  GravelBags.it  This test is Pamela t yet approved or cleared by the [place] FDA and  has been authorized for detection and/or diagnosis of SARS-CoV-2 by FDA under an Emergency Use Authorization (EUA). This EUA will remain  in effect (meaning this test  can be used) for the duration of the COVID-19 declaration under Section 564(b)(1) of the Act, 21 U.S.C. section 360bbb-3(b)(1), unless the authorization is terminated or revoked sooner.     Influenza A by PCR NEGATIVE NEGATIVE Final   Influenza B by PCR NEGATIVE NEGATIVE Final    Comment: (NOTE) The Xpert Xpress SARS-CoV-2/FLU/RSV assay is intended as an aid in  the diagnosis of influenza from Nasopharyngeal swab specimens and  should not be used as a sole basis for treatment. Nasal washings and  aspirates are unacceptable for Xpert Xpress SARS-CoV-2/FLU/RSV  testing.  Fact Sheet for Patients: PinkCheek.be  Fact Sheet for Healthcare Providers: GravelBags.it  This test is not yet approved or cleared by the [place] FDA and  has been authorized for detection and/or diagnosis of SARS-CoV-2 by  FDA under an Emergency Use Authorization (EUA). This EUA will remain  in effect (meaning this test can be used) for the duration of the  Covid-19 declaration under Section 564(b)(1) of the Act, 21  U.S.C. section 360bbb-3(b)(1), unless the authorization is  terminated or revoked. Performed at [facility], [place] [address], [place], [place] [address]   Culture, blood (routine x 2)     Status: None (Preliminary result)   Collection Time: 11/15/19  4:45 PM   Specimen: BLOOD  Result Value Ref Range Status   Specimen Description   Final    BLOOD SITE NOT SPECIFIED Performed at [facility], [place] [address], [place], [place] [address]    Special Requests   Final    BOTTLES DRAWN AEROBIC AND ANAEROBIC Blood Culture adequate volume Performed at [place] [address] [place], [place] [address]    Culture   Final    Pamela GROWTH 2 DAYS Performed at [place] [address] [place], [place] [address]    Report Status PENDING  Incomplete  Wound or Superficial Culture      Status: None (Preliminary result)   Collection Time: 11/15/19  4:56 PM   Specimen: Toe; Wound  Result Value Ref Range Status   Specimen Description   Final    TOE LEFT Performed at [place] [address], [place], [place] [address]    Special Requests   Final    NONE Performed at  [facility], [place] [address] [place], [place] [address]    Gram Stain   Final    RARE WBC PRESENT, PREDOMINANTLY PMN FEW GRAM POSITIVE COCCI    Culture   Final    ABUNDANT GRAM POSITIVE COCCI CULTURE REINCUBATED FOR BETTER GROWTH Performed at [facility], [place] [address], [place], [place] [address]    Report Status PENDING  Incomplete     Radiology Studies: MR FOOT LEFT WO CONTRAST  Result Date: 11/16/2019 CLINICAL DATA:  Diabetic Knight with pain and swelling of the left great toe. Wound on the dorsal aspect of the great toe. EXAM: MRI OF THE LEFT FOOT WITHOUT CONTRAST TECHNIQUE: Multiplanar, multisequence MR imaging of the left foot was performed. Pamela intravenous contrast was administered. COMPARISON:  Plain films left foot 11/15/2019. FINDINGS: Bones/Joint/Cartilage There is osteolysis of the majority of the distal phalanx of the great toe. Remnant of the distal phalanx demonstrates edema and decreased T1 signal consistent with osteomyelitis. Patchy, mild marrow edema is also seen in the head and neck of the proximal phalanx of the great toe. Bone marrow signal is otherwise unremarkable. Ligaments Appear intact. Muscles and Tendons Pamela intramuscular fluid collection. Intermediate increased T2 signal in all intrinsic musculature is most consistent with diabetic myopathy. There is mild intrasubstance increased T2 signal in the distal aspect of the flexor tendon of the great toe. Fluid is seen in the distal 1.5 cm of the sheath of the flexor tendon. Soft tissues Marked soft tissue swelling is present about the great toe. There is a fluid collection in the great  toe. Discrete measurement of the collection is not possible. It originates at a skin wound on the dorsal aspect of the toe and extends along the lateral margin of the head and neck of the proximal phalanx. The collection measures up to 2.1 cm transverse by 1.7 cm long in the plantar soft tissues. Small first MTP joint effusion noted. IMPRESSION: Marrow edema throughout the remnant of the distal phalanx of the great toe consistent with osteomyelitis. Milder degree of edema in the head and neck of the proximal phalanx of the great toe is also likely due to osteomyelitis. Skin wound on the great toe with an underlying fluid collection consistent with abscess as described above. Fluid in approximately the distal 1.5 cm of the sheath of the flexor tendon of the great toe is worrisome for septic tenosynovitis. Electronically Signed   By: Inge Rise M.D.   On: 11/16/2019 12:31    Marzetta Board, MD, PhD [facility]  Between 7 am - 7 pm I am available, please contact me via Amion or Securechat  Between 7 pm - 7 am I am not available, please contact night coverage MD/APP via Amion

## 2019-11-17 NOTE — Discharge Instructions (Signed)
Osteomyelitis, Adult  Bone infections (osteomyelitis) occur when bacteria or other germs get inside a bone. This can happen if you have an infection in another part of your body that spreads through your blood. Germs from your skin or from outside of your body can also cause this type of infection if you have a wound or a broken bone (fracture) that breaks the skin. Bone infections need to be treated quickly to prevent bone damage and to prevent the infection from spreading to other areas of your body. What are the causes? Most bone infections are caused by bacteria. They can also be caused by other germs, such as viruses and funguses. What increases the risk? You are more likely to develop this condition if you:  Recently had surgery, especially bone or joint surgery.  Have a long-term (chronic) disease, such as: ? Diabetes. ? HIV (human immunodeficiency virus). ? Rheumatoid arthritis. ? Sickle cell anemia. ? Kidney disease that requires dialysis.  Are aged 60 years or older.  Have a condition or take medicines that block or weaken your body's defense system (immune system).  Have a condition that reduces your blood flow.  Have an artificial joint.  Have had a joint or bone repaired with plates or screws (surgical hardware).  Use IV drugs.  Have a central line for IV access.  Have had trauma, such as stepping on a nail or a broken bone that came through the skin. What are the signs or symptoms? Symptoms vary depending on the type and location of your infection. Common symptoms of bone infections include:  Fever and chills.  Skin redness and warmth.  Swelling.  Pain and stiffness.  Drainage of fluid or pus near the infection. How is this diagnosed? This condition may be diagnosed based on:  Your symptoms and medical history.  A physical exam.  Tests, such as: ? A sample of tissue, fluid, or blood taken to be examined under a microscope. ? Pus or discharge swabbed  from a wound for testing to identify germs and to determine what type of medicine will kill them (culture and sensitivity). ? Blood tests.  Imaging studies. These may include: ? X-rays. ? MRI. ? CT scan. ? Bone scan. ? Ultrasound. How is this treated? Treatment for this condition depends on the cause and type of infection. Antibiotic medicines are usually the first treatment for a bone infection. This may be done in a hospital at first. You may have to continue IV antibiotics at home or take antibiotics by mouth for several weeks after that. Other treatments may include surgery to remove:  Dead or dying tissue from a bone.  An infected artificial joint.  Infected plates or screws that were used to repair a broken bone. Follow these instructions at home: Medicines   Take over-the-counter and prescription medicines only as told by your health care provider.  Take your antibiotic medicine as told by your health care provider. Do not stop taking the antibiotic even if you start to feel better.  Follow instructions from your health care provider about how to take IV antibiotics at home. You may need to have a nurse come to your home to give you the IV antibiotics. General instructions   Ask your health care provider if you have any restrictions on your activities.  If directed, put ice on the affected area: ? Put ice in a plastic bag. ? Place a towel between your skin and the bag. ? Leave the ice on for 20   minutes, 2-3 times a day.  Wash your hands often with soap and water. If soap and water are not available, use hand sanitizer.  Do not use any products that contain nicotine or tobacco, such as cigarettes and e-cigarettes. These can delay bone healing. If you need help quitting, ask your health care provider.  Keep all follow-up visits as told by your health care provider. This is important. Contact a health care provider if:  You develop a fever or chills.  You have  redness, warmth, pain, or swelling that returns after treatment. Get help right away if:  You have rapid breathing or you have trouble breathing.  You have chest pain.  You cannot drink fluids or make urine.  The affected area swells, changes color, or turns blue.  You have numbness or severe pain in the affected area. Summary  Bone infections (osteomyelitis) occur when bacteria or other germs get inside a bone.  You may be more likely to get this type of infection if you have a condition, such as diabetes, that lowers your ability to fight infection or increases your chances of getting an infection.  Most bone infections are caused by bacteria. They can also be caused by other germs, such as viruses and funguses.  Treatment for this condition usually starts with taking antibiotics. Further treatment depends on the cause and type of infection. This information is not intended to replace advice given to you by your health care provider. Make sure you discuss any questions you have with your health care provider. Document Revised: 01/07/2017 Document Reviewed: 12/31/2016 Elsevier Patient Education  2020 [employer] Inc[employer]  

## 2019-11-17 NOTE — Progress Notes (Signed)
PT Cancellation Note  Patient Details Name: Pamela Knight MRN: [id number] DOB: 31-Jan-1960   Cancelled Treatment:      Surgical procedure today amputation of L Great Toe.  Will attempt to see tomorrow.     Rica Koyanagi  PTA Acute  Rehabilitation Services Pager      [phone] Office      [phone]

## 2019-11-17 NOTE — Progress Notes (Signed)
Assisted Dr. Moser with left, ultrasound guided, popliteal block. Side rails up, monitors on throughout procedure. See vital signs in flow sheet. Tolerated Procedure well. 

## 2019-11-17 NOTE — Anesthesia Procedure Notes (Signed)
Anesthesia Regional Block: Popliteal block   Pre-Anesthetic Checklist: ,, timeout performed, Correct Patient, Correct Site, Correct Laterality, Correct Procedure, Correct Position, site marked, Risks and benefits discussed,  Surgical consent,  Pre-op evaluation,  At surgeon's request and post-op pain management  Laterality: Left and Lower  Prep: chloraprep       Needles:  Injection technique: Single-shot     Needle Length: 9cm  Needle Gauge: 22     Additional Needles: Arrow StimuQuik ECHO Echogenic Stimulating PNB Needle  Procedures:,,,, ultrasound used (permanent image in chart),,,,  Narrative:  Start time: 11/17/2019 2:55 PM End time: 11/17/2019 3:02 PM Injection made incrementally with aspirations every 5 mL.  Performed by: Personally  Anesthesiologist: Oleta Mouse, MD

## 2019-11-17 NOTE — Progress Notes (Signed)
     Pamela Knight is a 59 y.o. 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  She has fevers.  Denies chills.  Pain is minimal.  Objectyive: Vitals:   11/17/19 0549 11/17/19 1436  BP: 108/66 (!) 107/57  Pulse: 77 78  Resp: (!) 22 18  Temp: 98.9 F (37.2 C) (!) 101.2 F (38.4 C)  SpO2: 98% 95%     Exam: Awake and alert Respirations even and unlabored No acute distress  Left foot demonstrates blister on the medial aspect of the hallux.  Worsened swelling and redness about the medial foot.  More swelling about the hallux.  No other lateral foot swelling or redness.  No tenderness palpation about the ankle.  Foot is warm.  Difficult to palpate pulses due to swelling.  Baseline sensory deficits.  Assessment: Left foot abscess with distal and proximal phalanx hallux osteomyelitis   Plan: We will plan for hallux amputation and irrigation and excisional debridement of abscess within the foot.  We will send cultures.  Patient was allowed to ask questions and they were answered.  She understands the risks, benefits and alternatives to the surgery which include but are not limited to wound healing complications, continued infection, continued pain, phantom pain, need for further surgery, damage to surrounding structures.  Postoperatively she will be placed in a soft dressing and will be heel weightbearing.     Radene Journey, MD

## 2019-11-18 DIAGNOSIS — M86172 Other acute osteomyelitis, left ankle and foot: Secondary | ICD-10-CM | POA: Diagnosis not present

## 2019-11-18 DIAGNOSIS — E11621 Type 2 diabetes mellitus with foot ulcer: Secondary | ICD-10-CM | POA: Diagnosis not present

## 2019-11-18 DIAGNOSIS — I10 Essential (primary) hypertension: Secondary | ICD-10-CM | POA: Diagnosis not present

## 2019-11-18 DIAGNOSIS — I2510 Atherosclerotic heart disease of native coronary artery without angina pectoris: Secondary | ICD-10-CM | POA: Diagnosis not present

## 2019-11-18 LAB — AEROBIC CULTURE W GRAM STAIN (SUPERFICIAL SPECIMEN)

## 2019-11-18 LAB — GLUCOSE, CAPILLARY
Glucose-Capillary: 213 mg/dL — ABNORMAL HIGH (ref 70–99)
Glucose-Capillary: 325 mg/dL — ABNORMAL HIGH (ref 70–99)
Glucose-Capillary: 266 mg/dL — ABNORMAL HIGH (ref 70–99)
Glucose-Capillary: 238 mg/dL — ABNORMAL HIGH (ref 70–99)

## 2019-11-18 LAB — CREATININE, SERUM
Creatinine, Ser: 1.02 mg/dL — ABNORMAL HIGH (ref 0.44–1.00)
GFR, Estimated: >60 mL/min (ref >60)

## 2019-11-18 MED ORDER — CEFAZOLIN SODIUM-DEXTROSE 2-4 GM/100ML-% IV SOLN
2.0000 g | Freq: Three times a day (TID) | INTRAVENOUS | Status: DC
  Administered 2019-11-18 – 2019-11-21 (×8): 2 g via INTRAVENOUS
  Filled 2019-11-18 (×8): qty 100

## 2019-11-18 NOTE — Progress Notes (Signed)
PROGRESS NOTE  Pamela Knight YBF:[id number] DOB: 14-Nov-1960 DOA: 11/15/2019 PCP: Patient, No Pcp Per   LOS: 3 days   Brief Narrative / Interim history: 59 year old female with CAD, history of CABG, CHF, DM 2, HTN, HLD, depression who came into the hospital with generalized weakness over the last several weeks, intermittent fever or chills.  She used to live in [place], section 8 housing, however apparently lost that and moved to [place].  Currently living in a motel.  Reports that her left great toe has had a wound which is progressively getting worse with increased pain and increased drainage.  Imaging in the ER showed osteomyelitis  Subjective / 24h Interval events: Doing well this morning, underwent surgery yesterday  Assessment & Plan: Principal Problem Osteomyelitis of the left great toe -Patient was placed on broad-spectrum antibiotics, cultures were sent and pending.  So far culture shows abundant gram-positive cocci.  Orthopedic surgery was consulted, patient underwent an MRI yesterday which confirmed osteomyelitis.  Status post toe amputation on 11/12, monitor cultures  Active Problems Type 2 diabetes mellitus, insulin-dependent -Continue Lantus and NovoLog, continue gabapentin for neuropathy.  CBGs still a bit on the high side, increase Lantus and add mealtime NovoLog -CBGs a little bit better, continue current regimen  CBG (last 3)  Recent Labs    11/17/19 1746 11/17/19 2210 11/18/19 0743  GLUCAP 115* 249* 213*   Pseudohyponatremia -Due to elevated CBGs  Hyperlipidemia -Continue Lipitor  Coronary artery disease, history of CABG -No chest pain, slightly elevated troponin but very low levels, flat, not in a pattern consistent with ACS.  Likely demand.  Presumed chronic diastolic CHF -Appears euvolemic, no access to prior echoes  Presumed chronic kidney disease stage IIIa -Creatinine appears at her baseline, creatinine stable today  Essential  hypertension -Blood pressure still soft, continue to hold lisinopril  Social situation -Consult TOC, patient has out-of-state Medicaid and currently no good long-term living situation.  She is essentially homeless  Moderate malnutrition -Appreciate dietician input  Scheduled Meds: . aspirin EC  81 mg Oral Daily  . atorvastatin  40 mg Oral Daily  . enoxaparin (LOVENOX) injection  40 mg Subcutaneous Q24H  . feeding supplement (GLUCERNA SHAKE)  237 mL Oral TID BM  . fenofibrate  54 mg Oral Daily  . gabapentin  800 mg Oral TID  . insulin aspart  0-15 Units Subcutaneous TID WC  . insulin aspart  0-5 Units Subcutaneous QHS  . insulin aspart  3 Units Subcutaneous TID WC  . insulin glargine  40 Units Subcutaneous QHS  . lamoTRIgine  100 mg Oral BID  . metFORMIN  500 mg Oral BID WC  . metoprolol tartrate  50 mg Oral Daily  . multivitamin with minerals  1 tablet Oral Daily   Continuous Infusions: . piperacillin-tazobactam (ZOSYN)  IV 3.375 g (11/18/19 0538)  . vancomycin 750 mg (11/17/19 2100)   PRN Meds:.acetaminophen **OR** acetaminophen, ondansetron **OR** ondansetron (ZOFRAN) IV  Diet Orders (From admission, onward)    Start     Ordered   11/17/19 1744  Diet Carb Modified Fluid consistency: Thin; Room service appropriate? Yes  Diet effective now       Question Answer Comment  Diet-HS Snack? Nothing   Calorie Level Medium 1600-2000   Fluid consistency: Thin   Room service appropriate? Yes      11/17/19 1744          DVT prophylaxis: enoxaparin (LOVENOX) injection 40 mg Start: 11/15/19 2200     Code  Status: DNR  Family Communication: No family at bedside  Status is: Inpatient  Remains inpatient appropriate because:IV treatments appropriate due to intensity of illness or inability to take PO and Inpatient level of care appropriate due to severity of illness   Dispo: The patient is from: Home              Anticipated d/c is to: TBD              Anticipated d/c date  is: 3 days              Patient currently is not medically stable to d/c.  Consultants:  Orthopedic surgery   Procedures:  None   Microbiology  Wound cultures-abundant GPC's Blood cultures-negative  Antimicrobials: Vancomycin, Zosyn, 11/10   Objective: Vitals:   11/17/19 1940 11/17/19 2043 11/18/19 0128 11/18/19 0526  BP: 114/62 (!) 109/53 126/65 118/79  Pulse: 78 81 79 78  Resp: 20 20 20 18   Temp: 98.8 F (37.1 C) 100 F (37.8 C) (!) 100.4 F (38 C) (!) 97.5 F (36.4 C)  TempSrc: Oral Oral  Axillary  SpO2: 95% 96% 98% 96%  Weight:      Height:        Intake/Output Summary (Last 24 hours) at 11/18/2019 0950 Last data filed at 11/17/2019 2151 Gross per 24 hour  Intake 1823.67 ml  Output 10 ml  Net 1813.67 ml   Filed Weights   11/15/19 1755 11/16/19 0025  Weight: 81.6 kg 81.7 kg    Examination:  Constitutional: No distress in bed Eyes: No icterus ENMT: Moist mucous membranes Neck: normal, supple Respiratory: Clear bilaterally without wheezing or crackles Cardiovascular: Regular rate and rhythm, no murmurs, no edema Abdomen: Soft, NT, ND, bowel sounds positive Musculoskeletal: no clubbing / cyanosis.  Skin: No rashes seen Neurologic: Nonfocal  Data Reviewed: I have independently reviewed following labs and imaging studies   CBC: Recent Labs  Lab 11/15/19 1430 11/16/19 0326 11/17/19 0337  WBC 17.9* 15.9* 13.7*  HGB 11.8* 11.4* 10.4*  HCT 34.2* 34.7* 31.1*  MCV 91.9 94.3 94.2  PLT 381 390 073   Basic Metabolic Panel: Recent Labs  Lab 11/15/19 1430 11/16/19 0326 11/16/19 1311 11/17/19 0337 11/18/19 0401  NA 129* 130*  --  130*  --   K 4.9 4.2  --  4.5  --   CL 91* 97*  --  96*  --   CO2 26 24  --  26  --   GLUCOSE 334* 353* 512* 339*  --   BUN 14 17  --  25*  --   CREATININE 1.32* 1.26*  --  1.08* 1.02*  CALCIUM 9.4 8.5*  --  8.3*  --    Liver Function Tests: Recent Labs  Lab 11/15/19 1430 11/16/19 0326  AST 21 17  ALT 17 14   ALKPHOS 105 92  BILITOT 0.5 0.7  PROT 7.6 6.6  ALBUMIN 3.3* 2.8*   Coagulation Profile: No results for input(s): INR, PROTIME in the last 168 hours. HbA1C: Recent Labs    11/15/19 2000  HGBA1C 14.7*   CBG: Recent Labs  Lab 11/17/19 1435 11/17/19 1700 11/17/19 1746 11/17/19 2210 11/18/19 0743  GLUCAP 122* 134* 115* 249* 213*    Recent Results (from the past 240 hour(s))  Respiratory Panel by RT PCR (Flu A&B, Covid) - Nasopharyngeal Swab     Status: None   Collection Time: 11/15/19  4:33 PM   Specimen: Nasopharyngeal Swab  Result Value Ref Range  Status   SARS Coronavirus 2 by RT PCR NEGATIVE NEGATIVE Final    Comment: (NOTE) SARS-CoV-2 target nucleic acids are NOT DETECTED.  The SARS-CoV-2 RNA is generally detectable in upper respiratoy specimens during the acute phase of infection. The lowest concentration of SARS-CoV-2 viral copies this assay can detect is 131 copies/mL. A negative result does not preclude SARS-Cov-2 infection and should not be used as the sole basis for treatment or other patient management decisions. A negative result may occur with  improper specimen collection/handling, submission of specimen other than nasopharyngeal swab, presence of viral mutation(s) within the areas targeted by this assay, and inadequate number of viral copies (<131 copies/mL). A negative result must be combined with clinical observations, patient history, and epidemiological information. The expected result is Negative.  Fact Sheet for Patients:  PinkCheek.be  Fact Sheet for Healthcare Providers:  GravelBags.it  This test is no t yet approved or cleared by the [place] FDA and  has been authorized for detection and/or diagnosis of SARS-CoV-2 by FDA under an Emergency Use Authorization (EUA). This EUA will remain  in effect (meaning this test can be used) for the duration of the COVID-19 declaration under  Section 564(b)(1) of the Act, 21 U.S.C. section 360bbb-3(b)(1), unless the authorization is terminated or revoked sooner.     Influenza A by PCR NEGATIVE NEGATIVE Final   Influenza B by PCR NEGATIVE NEGATIVE Final    Comment: (NOTE) The Xpert Xpress SARS-CoV-2/FLU/RSV assay is intended as an aid in  the diagnosis of influenza from Nasopharyngeal swab specimens and  should not be used as a sole basis for treatment. Nasal washings and  aspirates are unacceptable for Xpert Xpress SARS-CoV-2/FLU/RSV  testing.  Fact Sheet for Patients: PinkCheek.be  Fact Sheet for Healthcare Providers: GravelBags.it  This test is not yet approved or cleared by the [place] FDA and  has been authorized for detection and/or diagnosis of SARS-CoV-2 by  FDA under an Emergency Use Authorization (EUA). This EUA will remain  in effect (meaning this test can be used) for the duration of the  Covid-19 declaration under Section 564(b)(1) of the Act, 21  U.S.C. section 360bbb-3(b)(1), unless the authorization is  terminated or revoked. Performed at [facility], [place] [address] [place], [place] [address]   Culture, blood (routine x 2)     Status: None (Preliminary result)   Collection Time: 11/15/19  4:45 PM   Specimen: BLOOD  Result Value Ref Range Status   Specimen Description   Final    BLOOD SITE NOT SPECIFIED Performed at [facility], [place] [address], [place], [place] [address]    Special Requests   Final    BOTTLES DRAWN AEROBIC AND ANAEROBIC Blood Culture adequate volume Performed at [place] [address], [place], [place] [address]    Culture   Final    NO GROWTH 2 DAYS Performed at [place] [address], [place], [place] [address]    Report Status PENDING  Incomplete  Wound or Superficial Culture     Status: None (Preliminary result)   Collection Time: 11/15/19  4:56  PM   Specimen: Toe; Wound  Result Value Ref Range Status   Specimen Description   Final    TOE LEFT Performed at [place] [address], [place], [place] [address]    Special Requests   Final    NONE Performed at [facility], [place] [address]  [place], [place] [address]    Gram Stain   Final    RARE WBC PRESENT, PREDOMINANTLY PMN FEW GRAM POSITIVE COCCI    Culture   Final    ABUNDANT STAPHYLOCOCCUS AUREUS SUSCEPTIBILITIES TO FOLLOW Performed at [facility], [place] [address] [place], [place] [address]    Report Status PENDING  Incomplete  Aerobic/Anaerobic Culture (surgical/deep wound)     Status: None (Preliminary result)   Collection Time: 11/17/19  3:39 PM   Specimen: Soft Tissue, Other  Result Value Ref Range Status   Specimen Description   Final    TOE LEFT GREAT TOE Performed at [place] [address], [place], [place] [address]    Special Requests   Final    NONE Performed at [facility], [place] [address], [place], [place] [address]    Gram Stain   Final    FEW WBC PRESENT, PREDOMINANTLY PMN RARE GRAM POSITIVE COCCI    Culture   Final    CULTURE REINCUBATED FOR BETTER GROWTH Performed at [facility], [place] [address], [place],  [address]    Report Status PENDING  Incomplete     Radiology Studies: No results found.  Marzetta Board, MD, PhD [facility]  Between 7 am - 7 pm I am available, please contact me via Amion or Securechat  Between 7 pm - 7 am I am not available, please contact night coverage MD/APP via Amion

## 2019-11-18 NOTE — Plan of Care (Signed)
  Problem: Education: Goal: Ability to describe self-care measures that may prevent or decrease complications (Diabetes Survival Skills Education) will improve Outcome: Progressing   Problem: Coping: Goal: Ability to adjust to condition or change in health will improve Outcome: Progressing   Problem: Fluid Volume: Goal: Ability to maintain a balanced intake and output will improve Outcome: Progressing   Problem: Health Behavior/Discharge Planning: Goal: Ability to identify and utilize available resources and services will improve Outcome: Progressing Goal: Ability to manage health-related needs will improve Outcome: Progressing   Problem: Nutritional: Goal: Maintenance of adequate nutrition will improve Outcome: Progressing Goal: Progress toward achieving an optimal weight will improve Outcome: Progressing   Problem: Skin Integrity: Goal: Risk for impaired skin integrity will decrease Outcome: Progressing   Problem: Tissue Perfusion: Goal: Adequacy of tissue perfusion will improve Outcome: Progressing   Problem: Education: Goal: Knowledge of General Education information will improve Description: Including pain rating scale, medication(s)/side effects and non-pharmacologic comfort measures Outcome: Progressing   Problem: Health Behavior/Discharge Planning: Goal: Ability to manage health-related needs will improve Outcome: Progressing   Problem: Clinical Measurements: Goal: Ability to maintain clinical measurements within normal limits will improve Outcome: Progressing Goal: Will remain free from infection Outcome: Progressing Goal: Diagnostic test results will improve Outcome: Progressing Goal: Respiratory complications will improve Outcome: Progressing Goal: Cardiovascular complication will be avoided Outcome: Progressing   Problem: Activity: Goal: Risk for activity intolerance will decrease Outcome: Progressing   Problem: Nutrition: Goal: Adequate nutrition  will be maintained Outcome: Progressing   Problem: Coping: Goal: Level of anxiety will decrease Outcome: Progressing   Problem: Elimination: Goal: Will not experience complications related to bowel motility Outcome: Progressing Goal: Will not experience complications related to urinary retention Outcome: Progressing   Problem: Pain Managment: Goal: General experience of comfort will improve Outcome: Progressing   Problem: Safety: Goal: Ability to remain free from injury will improve Outcome: Progressing   Problem: Skin Integrity: Goal: Risk for impaired skin integrity will decrease Outcome: Progressing

## 2019-11-18 NOTE — Progress Notes (Signed)
Physical Therapy Treatment Patient Details Name: Pamela Knight MRN: [id number] DOB: 02-28-1960 Today's Date: 11/18/2019    History of Present Illness Pt admitted with hyperglycemia, weakness, and L great toe osteomyelitis.  Pt with hx of MI, CHF, DM, CAD, CABG, and neuropathy    PT Comments    POD # 1 L Great Toe Amp Assisted OOB.  General bed mobility comments: self able with increased time also applied Post Op shoe on her own while long sitting in bed.  General transfer comment: Steady assist only; cues for use of UEs to self assist.  Also assisted to bathroom.  Able to self rise from toilet using grab bar.  Able to self perform peri care in standing.  No LOB.General Gait Details: cues for posture and position from RW; Steady assist only.  Cues for safety with turns. decreased distance this session due to c/o fatigue. Positioned in recliner to comfort.    Follow Up Recommendations  SNF;Home health PT (pending progress)     Equipment Recommendations  Rolling walker with 5" wheels    Recommendations for Other Services       Precautions / Restrictions Precautions Precautions: Fall Precaution Comments: Pt reports multiple previous falls Required Braces or Orthoses: Other Brace Other Brace: post Op shoe Restrictions Weight Bearing Restrictions: No LLE Weight Bearing: Weight bearing as tolerated    Mobility  Bed Mobility Overal bed mobility: Modified Independent             General bed mobility comments: self able with increased time also applied Post Op shoe on her own while long sitting in bed  Transfers Overall transfer level: Needs assistance Equipment used: Rolling walker (2 wheeled) Transfers: Sit to/from [facility] Sit to Stand: Supervision Stand pivot transfers: Supervision;Min guard       General transfer comment: Steady assist only; cues for use of UEs to self assist.  Also assisted to bathroom.  Able to self rise from toilet using grab  bar.  Able to self perform peri care in standing.  No LOB.  Ambulation/Gait Ambulation/Gait assistance: Min guard Gait Distance (Feet): 22 Feet (11 feet x 2 to and from bathroom only due to c/o fatigue after) Assistive device: Rolling walker (2 wheeled) Gait Pattern/deviations: Step-through pattern;Decreased step length - right;Decreased step length - left;Shuffle;Trunk flexed Gait velocity: decr   General Gait Details: cues for posture and position from RW; Steady assist only.  Cues for safety with turns. decreased distance this session due to c/o fatigue.   Stairs             Wheelchair Mobility    Modified Rankin (Stroke Patients Only)       Balance                                            Cognition Arousal/Alertness: Awake/alert Behavior During Therapy: WFL for tasks assessed/performed Overall Cognitive Status: Within Functional Limits for tasks assessed                                 General Comments: AxO x 3      Exercises      General Comments        Pertinent Vitals/Pain Pain Assessment: Faces Faces Pain Scale: Hurts a little bit Pain Location: general Pain Descriptors / Indicators: Grimacing  Pain Intervention(s): Monitored during session    Home Living                      Prior Function            PT Goals (current goals can now be found in the care plan section) Progress towards PT goals: Progressing toward goals    Frequency    Min 3X/week      PT Plan Current plan remains appropriate    Co-evaluation              AM-PAC PT "6 Clicks" Mobility   Outcome Measure  Help needed turning from your back to your side while in a flat bed without using bedrails?: None Help needed moving from lying on your back to sitting on the side of a flat bed without using bedrails?: A Little Help needed moving to and from a bed to a chair (including a wheelchair)?: A Little Help needed standing up  from a chair using your arms (e.g., wheelchair or bedside chair)?: A Little Help needed to walk in hospital room?: A Little Help needed climbing 3-5 steps with a railing? : A Lot 6 Click Score: 18    End of Session Equipment Utilized During Treatment: Gait belt Activity Tolerance: Patient tolerated treatment well;Patient limited by fatigue Patient left: in chair;with call bell/phone within reach;with chair alarm set Nurse Communication: Mobility status PT Visit Diagnosis: Muscle weakness (generalized) (M62.81);Difficulty in walking, not elsewhere classified (R26.2);Unsteadiness on feet (R26.81)     Time: 1425-1450 PT Time Calculation (min) (ACUTE ONLY): 25 min  Charges:  $Gait Training: 8-22 mins $Therapeutic Activity: 8-22 mins                     Rica Koyanagi  PTA Acute  Rehabilitation Services Pager      [phone] Office      [phone]

## 2019-11-19 DIAGNOSIS — E11621 Type 2 diabetes mellitus with foot ulcer: Secondary | ICD-10-CM | POA: Diagnosis not present

## 2019-11-19 DIAGNOSIS — E440 Moderate protein-calorie malnutrition: Secondary | ICD-10-CM | POA: Diagnosis present

## 2019-11-19 DIAGNOSIS — I10 Essential (primary) hypertension: Secondary | ICD-10-CM | POA: Diagnosis not present

## 2019-11-19 DIAGNOSIS — I2510 Atherosclerotic heart disease of native coronary artery without angina pectoris: Secondary | ICD-10-CM | POA: Diagnosis not present

## 2019-11-19 DIAGNOSIS — M86172 Other acute osteomyelitis, left ankle and foot: Secondary | ICD-10-CM | POA: Diagnosis not present

## 2019-11-19 LAB — CBC
HCT: 29 % — ABNORMAL LOW (ref 36.0–46.0)
Hemoglobin: 9.5 g/dL — ABNORMAL LOW (ref 12.0–15.0)
MCH: 31.4 pg (ref 26.0–34.0)
MCHC: 32.8 g/dL (ref 30.0–36.0)
MCV: 95.7 fL (ref 80.0–100.0)
Platelets: 357 10*3/uL (ref 150–400)
RBC: 3.03 MIL/uL — ABNORMAL LOW (ref 3.87–5.11)
RDW: 13.1 % (ref 11.5–15.5)
WBC: 11 10*3/uL — ABNORMAL HIGH (ref 4.0–10.5)
nRBC: 0 % (ref 0.0–0.2)

## 2019-11-19 LAB — COMPREHENSIVE METABOLIC PANEL
ALT: 11 U/L (ref 0–44)
AST: 14 U/L — ABNORMAL LOW (ref 15–41)
Albumin: 2.3 g/dL — ABNORMAL LOW (ref 3.5–5.0)
Alkaline Phosphatase: 78 U/L (ref 38–126)
Anion gap: 8 (ref 5–15)
BUN: 25 mg/dL — ABNORMAL HIGH (ref 8–23)
CO2: 29 mmol/L (ref 22–32)
Calcium: 8.6 mg/dL — ABNORMAL LOW (ref 8.9–10.3)
Chloride: 96 mmol/L — ABNORMAL LOW (ref 98–111)
Creatinine, Ser: 1.1 mg/dL — ABNORMAL HIGH (ref 0.44–1.00)
GFR, Estimated: 57 mL/min — ABNORMAL LOW (ref >60)
Glucose, Bld: 223 mg/dL — ABNORMAL HIGH (ref 70–99)
Potassium: 5.1 mmol/L (ref 3.5–5.1)
Sodium: 133 mmol/L — ABNORMAL LOW (ref 135–145)
Total Bilirubin: 0.3 mg/dL (ref 0.3–1.2)
Total Protein: 5.9 g/dL — ABNORMAL LOW (ref 6.5–8.1)

## 2019-11-19 LAB — GLUCOSE, CAPILLARY
Glucose-Capillary: 202 mg/dL — ABNORMAL HIGH (ref 70–99)
Glucose-Capillary: 393 mg/dL — ABNORMAL HIGH (ref 70–99)
Glucose-Capillary: 212 mg/dL — ABNORMAL HIGH (ref 70–99)
Glucose-Capillary: 226 mg/dL — ABNORMAL HIGH (ref 70–99)

## 2019-11-19 NOTE — Progress Notes (Signed)
PROGRESS NOTE  Pamela Knight CZY:[id number] DOB: Jun 22, 1960 DOA: 11/15/2019 PCP: Patient, No Pcp Per   LOS: 4 days   Brief Narrative / Interim history: 59 year old female with CAD, history of CABG, CHF, DM 2, HTN, HLD, depression who came into the hospital with generalized weakness over the last several weeks, intermittent fever or chills.  She used to live in [place], section 8 housing, however apparently lost that and moved to [place].  Currently living in a motel.  Reports that her left great toe has had a wound which is progressively getting worse with increased pain and increased drainage.  Imaging in the ER showed osteomyelitis  Subjective / 24h Interval events: A bit emotional this morning after the surgery  Assessment & Plan: Principal Problem Osteomyelitis of the left great toe -Patient was placed on broad-spectrum antibiotics, cultures were sent and speciated staph aureus, MSSA. On Ancef now. Orthopedic surgery was consulted, patient underwent an MRI which confirmed osteomyelitis.  Status post toe amputation on 11/12, intraoperative cultures are still pending. Blood cultures are negative.  Active Problems Type 2 diabetes mellitus, insulin-dependent -Continue Lantus and NovoLog, continue gabapentin for neuropathy.  -CBGs gradually better, continue current regimen  CBG (last 3)  Recent Labs    11/18/19 1637 11/18/19 2153 11/19/19 0737  GLUCAP 266* 238* 202*   Pseudohyponatremia -Due to elevated CBGs  Hyperlipidemia -Continue Lipitor  Coronary artery disease, history of CABG -No chest pain, slightly elevated troponin but very low levels, flat, not in a pattern consistent with ACS.  Likely demand.  Presumed chronic diastolic CHF -Appears euvolemic, no access to prior echoes  Presumed chronic kidney disease stage IIIa -Creatinine appears at her baseline, creatinine stable at 1.1 today  Essential hypertension -Blood pressure within normal parameters,  continue to hold lisinopril  Social situation -Consult TOC, patient has out-of-state Medicaid and currently no good long-term living situation.  She is essentially homeless  Moderate malnutrition -Appreciate dietician input  Scheduled Meds: . aspirin EC  81 mg Oral Daily  . atorvastatin  40 mg Oral Daily  . enoxaparin (LOVENOX) injection  40 mg Subcutaneous Q24H  . feeding supplement (GLUCERNA SHAKE)  237 mL Oral TID BM  . fenofibrate  54 mg Oral Daily  . gabapentin  800 mg Oral TID  . insulin aspart  0-15 Units Subcutaneous TID WC  . insulin aspart  0-5 Units Subcutaneous QHS  . insulin aspart  3 Units Subcutaneous TID WC  . insulin glargine  40 Units Subcutaneous QHS  . lamoTRIgine  100 mg Oral BID  . metFORMIN  500 mg Oral BID WC  . metoprolol tartrate  50 mg Oral Daily  . multivitamin with minerals  1 tablet Oral Daily   Continuous Infusions: .  ceFAZolin (ANCEF) IV 200 mL/hr at 11/19/19 0600   PRN Meds:.acetaminophen **OR** acetaminophen, ondansetron **OR** ondansetron (ZOFRAN) IV  Diet Orders (From admission, onward)    Start     Ordered   11/17/19 1744  Diet Carb Modified Fluid consistency: Thin; Room service appropriate? Yes  Diet effective now       Question Answer Comment  Diet-HS Snack? Nothing   Calorie Level Medium 1600-2000   Fluid consistency: Thin   Room service appropriate? Yes      11/17/19 1744          DVT prophylaxis: enoxaparin (LOVENOX) injection 40 mg Start: 11/15/19 2200     Code Status: DNR  Family Communication: No family at bedside  Status is: Inpatient  Remains  inpatient appropriate because:IV treatments appropriate due to intensity of illness or inability to take PO and Inpatient level of care appropriate due to severity of illness   Dispo: The patient is from: Home              Anticipated d/c is to: TBD              Anticipated d/c date is: 3 days              Patient currently is not medically stable to d/c.  Consultants:   Orthopedic surgery   Procedures:  None   Microbiology  Wound cultures-abundant GPC's Blood cultures-negative  Antimicrobials: Vancomycin, Zosyn, 11/10   Objective: Vitals:   11/18/19 1045 11/18/19 1502 11/18/19 2156 11/19/19 0547  BP: (!) 122/95 114/67 121/62 103/66  Pulse: 69 70 70 66  Resp: 17 18 18 16   Temp: 98 F (36.7 C) 98.3 F (36.8 C) 98.3 F (36.8 C) 98.2 F (36.8 C)  TempSrc: Oral Oral Oral Oral  SpO2: 99% 98% 97% 97%  Weight:      Height:        Intake/Output Summary (Last 24 hours) at 11/19/2019 1121 Last data filed at 11/19/2019 0600 Gross per 24 hour  Intake 1099.32 ml  Output --  Net 1099.32 ml   Filed Weights   11/15/19 1755 11/16/19 0025  Weight: 81.6 kg 81.7 kg    Examination:  Constitutional: NAD, in bed Eyes: No icterus ENMT: Moist mucous membranes Neck: normal, supple Respiratory: Clear bilaterally, no wheezing, no crackles Cardiovascular: Regular rate and rhythm, no murmurs, no edema Abdomen: Soft, NT, ND, bowel sounds positive Musculoskeletal: no clubbing / cyanosis.  Skin: No rashes appreciated Neurologic: No focal deficits  Data Reviewed: I have independently reviewed following labs and imaging studies   CBC: Recent Labs  Lab 11/15/19 1430 11/16/19 0326 11/17/19 0337 11/19/19 0333  WBC 17.9* 15.9* 13.7* 11.0*  HGB 11.8* 11.4* 10.4* 9.5*  HCT 34.2* 34.7* 31.1* 29.0*  MCV 91.9 94.3 94.2 95.7  PLT 381 390 332 659   Basic Metabolic Panel: Recent Labs  Lab 11/15/19 1430 11/16/19 0326 11/16/19 1311 11/17/19 0337 11/18/19 0401 11/19/19 0333  NA 129* 130*  --  130*  --  133*  K 4.9 4.2  --  4.5  --  5.1  CL 91* 97*  --  96*  --  96*  CO2 26 24  --  26  --  29  GLUCOSE 334* 353* 512* 339*  --  223*  BUN 14 17  --  25*  --  25*  CREATININE 1.32* 1.26*  --  1.08* 1.02* 1.10*  CALCIUM 9.4 8.5*  --  8.3*  --  8.6*   Liver Function Tests: Recent Labs  Lab 11/15/19 1430 11/16/19 0326 11/19/19 0333  AST 21 17 14*   ALT 17 14 11   ALKPHOS 105 92 78  BILITOT 0.5 0.7 0.3  PROT 7.6 6.6 5.9*  ALBUMIN 3.3* 2.8* 2.3*   Coagulation Profile: No results for input(s): INR, PROTIME in the last 168 hours. HbA1C: No results for input(s): HGBA1C in the last 72 hours. CBG: Recent Labs  Lab 11/18/19 0743 11/18/19 1146 11/18/19 1637 11/18/19 2153 11/19/19 0737  GLUCAP 213* 325* 266* 238* 202*    Recent Results (from the past 240 hour(s))  Respiratory Panel by RT PCR (Flu A&B, Covid) - Nasopharyngeal Swab     Status: None   Collection Time: 11/15/19  4:33 PM   Specimen: Nasopharyngeal  Swab  Result Value Ref Range Status   SARS Coronavirus 2 by RT PCR NEGATIVE NEGATIVE Final    Comment: (NOTE) SARS-CoV-2 target nucleic acids are NOT DETECTED.  The SARS-CoV-2 RNA is generally detectable in upper respiratoy specimens during the acute phase of infection. The lowest concentration of SARS-CoV-2 viral copies this assay can detect is 131 copies/mL. A negative result does not preclude SARS-Cov-2 infection and should not be used as the sole basis for treatment or other patient management decisions. A negative result may occur with  improper specimen collection/handling, submission of specimen other than nasopharyngeal swab, presence of viral mutation(s) within the areas targeted by this assay, and inadequate number of viral copies (<131 copies/mL). A negative result must be combined with clinical observations, patient history, and epidemiological information. The expected result is Negative.  Fact Sheet for Patients:  PinkCheek.be  Fact Sheet for Healthcare Providers:  GravelBags.it  This test is no t yet approved or cleared by the [place] FDA and  has been authorized for detection and/or diagnosis of SARS-CoV-2 by FDA under an Emergency Use Authorization (EUA). This EUA will remain  in effect (meaning this test can be used) for the duration  of the COVID-19 declaration under Section 564(b)(1) of the Act, 21 U.S.C. section 360bbb-3(b)(1), unless the authorization is terminated or revoked sooner.     Influenza A by PCR NEGATIVE NEGATIVE Final   Influenza B by PCR NEGATIVE NEGATIVE Final    Comment: (NOTE) The Xpert Xpress SARS-CoV-2/FLU/RSV assay is intended as an aid in  the diagnosis of influenza from Nasopharyngeal swab specimens and  should not be used as a sole basis for treatment. Nasal washings and  aspirates are unacceptable for Xpert Xpress SARS-CoV-2/FLU/RSV  testing.  Fact Sheet for Patients: PinkCheek.be  Fact Sheet for Healthcare Providers: GravelBags.it  This test is not yet approved or cleared by the [place] FDA and  has been authorized for detection and/or diagnosis of SARS-CoV-2 by  FDA under an Emergency Use Authorization (EUA). This EUA will remain  in effect (meaning this test can be used) for the duration of the  Covid-19 declaration under Section 564(b)(1) of the Act, 21  U.S.C. section 360bbb-3(b)(1), unless the authorization is  terminated or revoked. Performed at [facility], [place] [address], [place], [place] [address]   Culture, blood (routine x 2)     Status: None (Preliminary result)   Collection Time: 11/15/19  4:45 PM   Specimen: BLOOD  Result Value Ref Range Status   Specimen Description   Final    BLOOD SITE NOT SPECIFIED Performed at [facility], [place] [address] [place], [place] [address]    Special Requests   Final    BOTTLES DRAWN AEROBIC AND ANAEROBIC Blood Culture adequate volume Performed at [place] [address] [place], [place] [address]    Culture   Final    NO GROWTH 4 DAYS Performed at [facility], [place] [address] [place], [place] [address]    Report Status PENDING  Incomplete  Wound or Superficial Culture     Status: None   Collection Time:  11/15/19  4:56 PM   Specimen: Toe; Wound  Result Value Ref Range Status   Specimen Description   Final    TOE LEFT Performed at [place] [address], [place], [place] [address]    Special Requests   Final    NONE Performed at [facility],  [place] [address], [place], [place] [address]    Gram Stain   Final    RARE WBC PRESENT, PREDOMINANTLY PMN FEW GRAM POSITIVE COCCI Performed at [facility], [place] [address], [place], [place] [address]    Culture ABUNDANT STAPHYLOCOCCUS AUREUS  Final   Report Status 11/18/2019 FINAL  Final   Organism ID, Bacteria STAPHYLOCOCCUS AUREUS  Final      Susceptibility   Staphylococcus aureus - MIC*    CIPROFLOXACIN <=0.5 SENSITIVE Sensitive     ERYTHROMYCIN <=0.25 SENSITIVE Sensitive     GENTAMICIN <=0.5 SENSITIVE Sensitive     OXACILLIN <=0.25 SENSITIVE Sensitive     TETRACYCLINE <=1 SENSITIVE Sensitive     VANCOMYCIN <=0.5 SENSITIVE Sensitive     TRIMETH/SULFA <=10 SENSITIVE Sensitive     CLINDAMYCIN >=8 RESISTANT Resistant     RIFAMPIN <=0.5 SENSITIVE Sensitive     Inducible Clindamycin NEGATIVE Sensitive     * ABUNDANT STAPHYLOCOCCUS AUREUS  Aerobic/Anaerobic Culture (surgical/deep wound)     Status: None (Preliminary result)   Collection Time: 11/17/19  3:39 PM   Specimen: Soft Tissue, Other  Result Value Ref Range Status   Specimen Description   Final    TOE LEFT GREAT TOE Performed at [place] [address], [place], [place] [address]    Special Requests   Final    NONE Performed at [facility], [place] [address] [place], [place] [address]    Gram Stain   Final    FEW WBC PRESENT, PREDOMINANTLY PMN RARE GRAM POSITIVE COCCI Performed at [facility], [place] [address] [place], [place] [address]    Culture   Final    FEW STAPHYLOCOCCUS AUREUS SUSCEPTIBILITIES TO FOLLOW NO ANAEROBES ISOLATED; CULTURE IN PROGRESS FOR 5 DAYS    Report  Status PENDING  Incomplete     Radiology Studies: No results found.  Marzetta Board, MD, PhD [facility]  Between 7 am - 7 pm I am available, please contact me via Amion or Securechat  Between 7 pm - 7 am I am not available, please contact night coverage MD/APP via Amion

## 2019-11-19 NOTE — Plan of Care (Signed)
  Problem: Education: Goal: Ability to describe self-care measures that may prevent or decrease complications (Diabetes Survival Skills Education) will improve Outcome: Progressing   Problem: Coping: Goal: Ability to adjust to condition or change in health will improve Outcome: Progressing   Problem: Health Behavior/Discharge Planning: Goal: Ability to manage health-related needs will improve Outcome: Progressing   

## 2019-11-20 DIAGNOSIS — I10 Essential (primary) hypertension: Secondary | ICD-10-CM | POA: Diagnosis not present

## 2019-11-20 DIAGNOSIS — E11621 Type 2 diabetes mellitus with foot ulcer: Secondary | ICD-10-CM | POA: Diagnosis not present

## 2019-11-20 DIAGNOSIS — I2510 Atherosclerotic heart disease of native coronary artery without angina pectoris: Secondary | ICD-10-CM | POA: Diagnosis not present

## 2019-11-20 DIAGNOSIS — M86172 Other acute osteomyelitis, left ankle and foot: Secondary | ICD-10-CM | POA: Diagnosis not present

## 2019-11-20 LAB — GLUCOSE, CAPILLARY
Glucose-Capillary: 247 mg/dL — ABNORMAL HIGH (ref 70–99)
Glucose-Capillary: 229 mg/dL — ABNORMAL HIGH (ref 70–99)
Glucose-Capillary: 165 mg/dL — ABNORMAL HIGH (ref 70–99)
Glucose-Capillary: 237 mg/dL — ABNORMAL HIGH (ref 70–99)

## 2019-11-20 LAB — CULTURE, BLOOD (ROUTINE X 2)
Culture: NO GROWTH
Special Requests: ADEQUATE

## 2019-11-20 NOTE — Progress Notes (Signed)
     Pamela Knight is a 59 y.o. female   Orthopaedic diagnosis: Status post left hallux amputation and I&D of deep forefoot abscess  Subjective: Patient is resting comfortably.  Denies wound drainage.  Denies pain.  Objectyive: Vitals:   11/19/19 2136 11/20/19 0456  BP: 136/67 125/65  Pulse: 73 71  Resp: 16 16  Temp: 99.5 F (37.5 C) 98.6 F (37 C)  SpO2: 97% 100%     Exam: Respirations even and unlabored No acute distress  Dressing was pulled back from the surgical site.  Improved swelling and redness about the foot.  No strikethrough at the surgical site on the dressing.  No tenderness about the ankle.  No pain with range of motion of the ankle.  Microbiology: Staph aureus  Assessment: Status post left hallux amputation and I&D of deep forefoot abscess   Plan: She is doing well.  Will keep the dressing in place until follow-up. Would recommend continuing antibiotics for surrounding cellulitis.  She is seeing improvement. Okay from orthopedic standpoint for discharge once home antibiotic therapy established. She will follow-up with me 1 week post discharge for wound check.  I had discussed with her previously about leaving the sutures in place for 4 weeks given her diabetic status. Heel weightbearing in a postoperative shoe.     Radene Journey, MD

## 2019-11-20 NOTE — Anesthesia Postprocedure Evaluation (Signed)
Anesthesia Post Note  Patient: KELLE RUPPERT  Procedure(s) Performed: AMPUTATION LEFT GREAT TOE (Left )     Patient location during evaluation: PACU Anesthesia Type: Regional and MAC Level of consciousness: awake and alert Pain management: pain level controlled Vital Signs Assessment: post-procedure vital signs reviewed and stable Respiratory status: spontaneous breathing, nonlabored ventilation, respiratory function stable and patient connected to nasal cannula oxygen Cardiovascular status: stable and blood pressure returned to baseline Postop Assessment: no apparent nausea or vomiting Anesthetic complications: no   No complications documented.  Last Vitals:  Vitals:   11/20/19 0456 11/20/19 1409  BP: 125/65 113/65  Pulse: 71 62  Resp: 16 18  Temp: 37 C   SpO2: 100% 100%    Last Pain:  Vitals:   11/20/19 1200  TempSrc:   PainSc: Asleep                 Sanford Lindblad

## 2019-11-20 NOTE — NC FL2 (Addendum)
[place] LEVEL OF CARE SCREENING TOOL     IDENTIFICATION  Patient Name: Pamela Knight Birthdate: [birth year] Sex: female Admission Date (Current Location): 11/15/2019  [facility] and [place] Number:  Herbalist and Address:  [facility],  [place] [address] [place]      Provider Number: [id number]  Attending Physician Name and Address:  Caren Griffins, MD  Relative Name and Phone Number:       Current Level of Care: Hospital Recommended Level of Care: [place] Prior Approval Number:    Date Approved/Denied:   PASRR Number:    [id number]  Discharge Plan: SNF    Current Diagnoses: Patient Active Problem List   Diagnosis Date Noted  . Malnutrition of moderate degree 11/19/2019  . Osteomyelitis ([place]) 11/15/2019  . Personal history of noncompliance with medical treatment, presenting hazards to health 12/26/2015  . Uncontrolled type 2 diabetes mellitus with stage 2 chronic kidney disease, with long-term current use of insulin ([place]) 12/18/2015  . Essential hypertension, benign 12/18/2015  . Hypercholesteremia 12/18/2015  . DM type 2 causing vascular disease ([place]) 12/18/2015    Orientation RESPIRATION BLADDER Height & Weight     Self, Time, Situation, Place  Normal Continent Weight: 180 lb 1.9 oz (81.7 kg) Height:  5\' 2"  (157.5 cm)  BEHAVIORAL SYMPTOMS/MOOD NEUROLOGICAL BOWEL NUTRITION STATUS      Continent Diet  Carb Modified  AMBULATORY STATUS COMMUNICATION OF NEEDS Skin   Extensive Assist   Surgical wounds Status post left hallux amputation                       Personal Care Assistance Level of Assistance  Bathing, Feeding, Dressing Bathing Assistance: Limited assistance Feeding assistance: Independent Dressing Assistance: Limited assistance     Functional Limitations Info  Sight, Hearing, Speech Sight Info: Impaired (Wears Glasses) Hearing Info: Adequate Speech Info: Adequate     SPECIAL CARE FACTORS FREQUENCY  PT (By licensed PT), OT (By licensed OT)     PT Frequency: 5x/week OT Frequency: 5x/week            Contractures Contractures Info: Not present    Additional Factors Info  Code Status, Allergies, Psychotropic, Insulin Sliding Scale Code Status Info: DNR Allergies Info: Allergies: Avandia Rosiglitazone, Niacin And Related Psychotropic Info: Lamictal Insulin Sliding Scale Info: See discharge summary       Current Medications (11/20/2019):  This is the current hospital active medication list Current Facility-Administered Medications  Medication Dose Route Frequency Provider Last Rate Last Admin  . acetaminophen (TYLENOL) tablet 650 mg  650 mg Oral Q6H PRN Erle Crocker, MD   650 mg at 11/19/19 2130   Or  . acetaminophen (TYLENOL) suppository 650 mg  650 mg Rectal Q6H PRN Erle Crocker, MD      . aspirin EC tablet 81 mg  81 mg Oral Daily Erle Crocker, MD   81 mg at 11/20/19 0951  . atorvastatin (LIPITOR) tablet 40 mg  40 mg Oral Daily Erle Crocker, MD   40 mg at 11/20/19 0951  . ceFAZolin (ANCEF) IVPB 2g/100 mL premix  2 g Intravenous Q8H Caren Griffins, MD 200 mL/hr at 11/20/19 0811 2 g at 11/20/19 0811  . enoxaparin (LOVENOX) injection 40 mg  40 mg Subcutaneous Q24H Erle Crocker, MD   40 mg at 11/19/19 2130  . feeding supplement (GLUCERNA SHAKE) (GLUCERNA SHAKE) liquid 237 mL  237 mL Oral  TID BM Erle Crocker, MD   237 mL at 11/20/19 0954  . fenofibrate tablet 54 mg  54 mg Oral Daily Erle Crocker, MD   54 mg at 11/20/19 1601  . gabapentin (NEURONTIN) capsule 800 mg  800 mg Oral TID Erle Crocker, MD   800 mg at 11/20/19 0932  . insulin aspart (novoLOG) injection 0-15 Units  0-15 Units Subcutaneous TID WC Erle Crocker, MD   5 Units at 11/20/19 1223  . insulin aspart (novoLOG) injection 0-5 Units  0-5 Units Subcutaneous QHS Erle Crocker, MD   2 Units at 11/19/19 2139   . insulin aspart (novoLOG) injection 3 Units  3 Units Subcutaneous TID WC Erle Crocker, MD   3 Units at 11/20/19 1223  . insulin glargine (LANTUS) injection 40 Units  40 Units Subcutaneous QHS Erle Crocker, MD   40 Units at 11/19/19 2139  . lamoTRIgine (LAMICTAL) tablet 100 mg  100 mg Oral BID Erle Crocker, MD   100 mg at 11/20/19 3557  . metFORMIN (GLUCOPHAGE) tablet 500 mg  500 mg Oral BID WC Erle Crocker, MD   500 mg at 11/20/19 3220  . metoprolol tartrate (LOPRESSOR) tablet 50 mg  50 mg Oral Daily Erle Crocker, MD   50 mg at 11/20/19 2542  . multivitamin with minerals tablet 1 tablet  1 tablet Oral Daily Erle Crocker, MD   1 tablet at 11/20/19 0951  . ondansetron (ZOFRAN) tablet 4 mg  4 mg Oral Q6H PRN Erle Crocker, MD       Or  . ondansetron [facility]) injection 4 mg  4 mg Intravenous Q6H PRN Erle Crocker, MD         Discharge Medications: Please see discharge summary for a list of discharge medications.  Relevant Imaging Results:  Relevant Lab Results:   Additional Information HCW:[id number]  Lia Hopping, LCSW

## 2019-11-20 NOTE — Progress Notes (Signed)
PT Cancellation Note  Patient Details Name: SHAQUILLA KEHRES MRN: [id number] DOB: [birth year]   Cancelled Treatment:     attempted 3 times to walk pt each time she presented with a different excuse.  Will attempt to see tomorrow.   Rica Koyanagi  PTA Acute  Rehabilitation Services Pager      [phone] Office      [phone]

## 2019-11-20 NOTE — Progress Notes (Signed)
PROGRESS NOTE  KYSA CALAIS QIW:[id number] DOB: [birth year] DOA: 11/15/2019 PCP: Patient, No Pcp Per   LOS: 5 days   Brief Narrative / Interim history: 59 year old female with CAD, history of CABG, CHF, DM 2, HTN, HLD, depression who came into the hospital with generalized weakness over the last several weeks, intermittent fever or chills.  She used to live in [place], section 8 housing, however apparently lost that and moved to [place].  Currently living in a motel.  Reports that her left great toe has had a wound which is progressively getting worse with increased pain and increased drainage.  Imaging in the ER showed osteomyelitis  Subjective / 24h Interval events: Doing well this morning, awaiting breakfast  Assessment & Plan: Principal Problem Osteomyelitis of the left great toe -Patient was placed on broad-spectrum antibiotics, cultures were sent and speciated staph aureus, MSSA. On Ancef now. Orthopedic surgery was consulted, patient underwent an MRI which confirmed osteomyelitis.  Status post toe amputation on 11/12, intraoperative cultures are still pending, primarily is showing staph aureus. Blood cultures are negative.  Active Problems Type 2 diabetes mellitus, insulin-dependent -Continue Lantus and NovoLog, continue gabapentin for neuropathy.  -Fasting this morning slightly high, increase Lantus  CBG (last 3)  Recent Labs    11/19/19 1622 11/19/19 2132 11/20/19 0746  GLUCAP 212* 226* 247*   Pseudohyponatremia -Due to elevated CBGs  Hyperlipidemia -Continue Lipitor  Coronary artery disease, history of CABG -No chest pain, slightly elevated troponin but very low levels, flat, not in a pattern consistent with ACS.    Likely demand ischemia  Presumed chronic diastolic CHF -Appears euvolemic, no access to prior echoes  Presumed chronic kidney disease stage IIIa -Creatinine appears at her baseline, creatinine is stable, check again in the  morning  Essential hypertension -Continue to hold lisinopril.  Blood pressure stable this morning  Social situation -Consult TOC, patient has out-of-state Medicaid and currently no good long-term living situation.  She is essentially homeless.  She has a place to stay starting December 1 with her friend  Moderate malnutrition -Appreciate dietician input  Scheduled Meds: . aspirin EC  81 mg Oral Daily  . atorvastatin  40 mg Oral Daily  . enoxaparin (LOVENOX) injection  40 mg Subcutaneous Q24H  . feeding supplement (GLUCERNA SHAKE)  237 mL Oral TID BM  . fenofibrate  54 mg Oral Daily  . gabapentin  800 mg Oral TID  . insulin aspart  0-15 Units Subcutaneous TID WC  . insulin aspart  0-5 Units Subcutaneous QHS  . insulin aspart  3 Units Subcutaneous TID WC  . insulin glargine  40 Units Subcutaneous QHS  . lamoTRIgine  100 mg Oral BID  . metFORMIN  500 mg Oral BID WC  . metoprolol tartrate  50 mg Oral Daily  . multivitamin with minerals  1 tablet Oral Daily   Continuous Infusions: .  ceFAZolin (ANCEF) IV 2 g (11/20/19 0811)   PRN Meds:.acetaminophen **OR** acetaminophen, ondansetron **OR** ondansetron (ZOFRAN) IV  Diet Orders (From admission, onward)    Start     Ordered   11/17/19 1744  Diet Carb Modified Fluid consistency: Thin; Room service appropriate? Yes  Diet effective now       Question Answer Comment  Diet-HS Snack? Nothing   Calorie Level Medium 1600-2000   Fluid consistency: Thin   Room service appropriate? Yes      11/17/19 1744          DVT prophylaxis: enoxaparin (LOVENOX) injection 40 mg  Start: 11/15/19 2200     Code Status: DNR  Family Communication: No family at bedside  Status is: Inpatient  Remains inpatient appropriate because:IV treatments appropriate due to intensity of illness or inability to take PO and Inpatient level of care appropriate due to severity of illness   Dispo: The patient is from: Home              Anticipated d/c is to:  TBD              Anticipated d/c date is: 3 days              Patient currently is not medically stable to d/c.  Consultants:  Orthopedic surgery   Procedures:  None   Microbiology  Wound cultures-abundant GPC's Blood cultures-negative  Antimicrobials: Vancomycin, Zosyn, 11/10   Objective: Vitals:   11/19/19 0547 11/19/19 1412 11/19/19 2136 11/20/19 0456  BP: 103/66 137/84 136/67 125/65  Pulse: 66 71 73 71  Resp: 16 17 16 16   Temp: 98.2 F (36.8 C) 98.3 F (36.8 C) 99.5 F (37.5 C) 98.6 F (37 C)  TempSrc: Oral     SpO2: 97% 99% 97% 100%  Weight:      Height:        Intake/Output Summary (Last 24 hours) at 11/20/2019 1023 Last data filed at 11/20/2019 0600 Gross per 24 hour  Intake 1520.07 ml  Output 450 ml  Net 1070.07 ml   Filed Weights   11/15/19 1755 11/16/19 0025  Weight: 81.6 kg 81.7 kg    Examination:  Constitutional: No distress, in bed Eyes: No scleral icterus ENMT: Moist mucous membranes Neck: normal, supple Respiratory: Lungs are clear, no wheezing, no crackles Cardiovascular: Regular rate and rhythm, no murmurs, no peripheral edema Abdomen: Soft, nontender, nondistended, bowel sounds are positive Musculoskeletal: no clubbing / cyanosis.  Skin: no rashes seen Neurologic: Nonfocal, equal strength  Data Reviewed: I have independently reviewed following labs and imaging studies   CBC: Recent Labs  Lab 11/15/19 1430 11/16/19 0326 11/17/19 0337 11/19/19 0333  WBC 17.9* 15.9* 13.7* 11.0*  HGB 11.8* 11.4* 10.4* 9.5*  HCT 34.2* 34.7* 31.1* 29.0*  MCV 91.9 94.3 94.2 95.7  PLT 381 390 332 096   Basic Metabolic Panel: Recent Labs  Lab 11/15/19 1430 11/16/19 0326 11/16/19 1311 11/17/19 0337 11/18/19 0401 11/19/19 0333  NA 129* 130*  --  130*  --  133*  K 4.9 4.2  --  4.5  --  5.1  CL 91* 97*  --  96*  --  96*  CO2 26 24  --  26  --  29  GLUCOSE 334* 353* 512* 339*  --  223*  BUN 14 17  --  25*  --  25*  CREATININE 1.32* 1.26*   --  1.08* 1.02* 1.10*  CALCIUM 9.4 8.5*  --  8.3*  --  8.6*   Liver Function Tests: Recent Labs  Lab 11/15/19 1430 11/16/19 0326 11/19/19 0333  AST 21 17 14*  ALT 17 14 11   ALKPHOS 105 92 78  BILITOT 0.5 0.7 0.3  PROT 7.6 6.6 5.9*  ALBUMIN 3.3* 2.8* 2.3*   Coagulation Profile: No results for input(s): INR, PROTIME in the last 168 hours. HbA1C: No results for input(s): HGBA1C in the last 72 hours. CBG: Recent Labs  Lab 11/19/19 0737 11/19/19 1143 11/19/19 1622 11/19/19 2132 11/20/19 0746  GLUCAP 202* 393* 212* 226* 247*    Recent Results (from the past 240 hour(s))  Respiratory  Panel by RT PCR (Flu A&B, Covid) - Nasopharyngeal Swab     Status: None   Collection Time: 11/15/19  4:33 PM   Specimen: Nasopharyngeal Swab  Result Value Ref Range Status   SARS Coronavirus 2 by RT PCR NEGATIVE NEGATIVE Final    Comment: (NOTE) SARS-CoV-2 target nucleic acids are NOT DETECTED.  The SARS-CoV-2 RNA is generally detectable in upper respiratoy specimens during the acute phase of infection. The lowest concentration of SARS-CoV-2 viral copies this assay can detect is 131 copies/mL. A negative result does not preclude SARS-Cov-2 infection and should not be used as the sole basis for treatment or other patient management decisions. A negative result may occur with  improper specimen collection/handling, submission of specimen other than nasopharyngeal swab, presence of viral mutation(s) within the areas targeted by this assay, and inadequate number of viral copies (<131 copies/mL). A negative result must be combined with clinical observations, patient history, and epidemiological information. The expected result is Negative.  Fact Sheet for Patients:  PinkCheek.be  Fact Sheet for Healthcare Providers:  GravelBags.it  This test is no t yet approved or cleared by the [place] FDA and  has been authorized for  detection and/or diagnosis of SARS-CoV-2 by FDA under an Emergency Use Authorization (EUA). This EUA will remain  in effect (meaning this test can be used) for the duration of the COVID-19 declaration under Section 564(b)(1) of the Act, 21 U.S.C. section 360bbb-3(b)(1), unless the authorization is terminated or revoked sooner.     Influenza A by PCR NEGATIVE NEGATIVE Final   Influenza B by PCR NEGATIVE NEGATIVE Final    Comment: (NOTE) The Xpert Xpress SARS-CoV-2/FLU/RSV assay is intended as an aid in  the diagnosis of influenza from Nasopharyngeal swab specimens and  should not be used as a sole basis for treatment. Nasal washings and  aspirates are unacceptable for Xpert Xpress SARS-CoV-2/FLU/RSV  testing.  Fact Sheet for Patients: PinkCheek.be  Fact Sheet for Healthcare Providers: GravelBags.it  This test is not yet approved or cleared by the [place] FDA and  has been authorized for detection and/or diagnosis of SARS-CoV-2 by  FDA under an Emergency Use Authorization (EUA). This EUA will remain  in effect (meaning this test can be used) for the duration of the  Covid-19 declaration under Section 564(b)(1) of the Act, 21  U.S.C. section 360bbb-3(b)(1), unless the authorization is  terminated or revoked. Performed at [facility], [place] [address], [place], [place] [address]   Culture, blood (routine x 2)     Status: None (Preliminary result)   Collection Time: 11/15/19  4:45 PM   Specimen: BLOOD  Result Value Ref Range Status   Specimen Description   Final    BLOOD SITE NOT SPECIFIED Performed at [facility], [place] [address] [place], [place] [address]    Special Requests   Final    BOTTLES DRAWN AEROBIC AND ANAEROBIC Blood Culture adequate volume Performed at [place] [address], [place], [place] [address]    Culture   Final    NO GROWTH 4 DAYS Performed  at [facility], [place] [address], [place], [place] [address]    Report Status PENDING  Incomplete  Wound or Superficial Culture     Status: None   Collection Time: 11/15/19  4:56 PM   Specimen: Toe; Wound  Result Value Ref Range Status   Specimen Description   Final    TOE LEFT Performed at [facility]  [place] [address] [place], [place] [address]    Special Requests   Final    NONE Performed at [facility], [place] [address] [place], [place] [address]    Gram Stain   Final    RARE WBC PRESENT, PREDOMINANTLY PMN FEW GRAM POSITIVE COCCI Performed at [facility], [place] [address], [place], [place] [address]    Culture ABUNDANT STAPHYLOCOCCUS AUREUS  Final   Report Status 11/18/2019 FINAL  Final   Organism ID, Bacteria STAPHYLOCOCCUS AUREUS  Final      Susceptibility   Staphylococcus aureus - MIC*    CIPROFLOXACIN <=0.5 SENSITIVE Sensitive     ERYTHROMYCIN <=0.25 SENSITIVE Sensitive     GENTAMICIN <=0.5 SENSITIVE Sensitive     OXACILLIN <=0.25 SENSITIVE Sensitive     TETRACYCLINE <=1 SENSITIVE Sensitive     VANCOMYCIN <=0.5 SENSITIVE Sensitive     TRIMETH/SULFA <=10 SENSITIVE Sensitive     CLINDAMYCIN >=8 RESISTANT Resistant     RIFAMPIN <=0.5 SENSITIVE Sensitive     Inducible Clindamycin NEGATIVE Sensitive     * ABUNDANT STAPHYLOCOCCUS AUREUS  Aerobic/Anaerobic Culture (surgical/deep wound)     Status: None (Preliminary result)   Collection Time: 11/17/19  3:39 PM   Specimen: Soft Tissue, Other  Result Value Ref Range Status   Specimen Description   Final    TOE LEFT GREAT TOE Performed at [place] [address], [place], [place] [address]    Special Requests   Final    NONE Performed at [facility], [place] [address] [place], [place] [address]    Gram Stain   Final    FEW WBC PRESENT, PREDOMINANTLY PMN RARE GRAM POSITIVE COCCI Performed at [facility], [place] [address] [place], [place] [address]    Culture   Final    FEW STAPHYLOCOCCUS AUREUS SUSCEPTIBILITIES TO FOLLOW NO ANAEROBES ISOLATED; CULTURE IN PROGRESS FOR 5 DAYS    Report Status PENDING  Incomplete     Radiology Studies: No results found.  Marzetta Board, MD, PhD [facility]  Between 7 am - 7 pm I am available, please contact me via Amion or Securechat  Between 7 pm - 7 am I am not available, please contact night coverage MD/APP via Amion

## 2019-11-20 NOTE — TOC Progression Note (Addendum)
Transition of Care [facility]) - Progression Note    Patient Details  Name: Pamela Knight MRN: [id number] Date of Birth: [birth year]  Transition of Care [facility]) CM/SW [place], [place] Phone Number: 11/20/2019, 3:16 PM  Clinical Narrative:    CSW met with the patient at bedside to discuss discharge to SNF. Patient declines SNF. CSW discussed the patient safety/safe discharge plan. Patient requested time to call her friend and discuss a discharge plan.  CSW made a referral to SNF's- in [place] and [place] as a alternative plan if the does not have on at time of discharge.   TOC staff will follow up with bed offers.    Expected Discharge Plan: Skilled Nursing Facility Barriers to Discharge: SNF Pending bed offer  Expected Discharge Plan and Services Expected Discharge Plan: [place] In-house Referral: Clinical Social Work Discharge Planning Services: CM Consult Post Acute Care Choice: [place] arrangements for the past 2 months: Homeless, Single Family Home                                       Social Determinants of Health (SDOH) Interventions    Readmission Risk Interventions No flowsheet data found.

## 2019-11-21 ENCOUNTER — Encounter (HOSPITAL_COMMUNITY): Payer: Self-pay | Admitting: Orthopaedic Surgery

## 2019-11-21 DIAGNOSIS — M86172 Other acute osteomyelitis, left ankle and foot: Secondary | ICD-10-CM | POA: Diagnosis not present

## 2019-11-21 DIAGNOSIS — I10 Essential (primary) hypertension: Secondary | ICD-10-CM | POA: Diagnosis not present

## 2019-11-21 DIAGNOSIS — E11621 Type 2 diabetes mellitus with foot ulcer: Secondary | ICD-10-CM | POA: Diagnosis not present

## 2019-11-21 DIAGNOSIS — I2510 Atherosclerotic heart disease of native coronary artery without angina pectoris: Secondary | ICD-10-CM | POA: Diagnosis not present

## 2019-11-21 LAB — CBC
HCT: 29.2 % — ABNORMAL LOW (ref 36.0–46.0)
Hemoglobin: 9.3 g/dL — ABNORMAL LOW (ref 12.0–15.0)
MCH: 30.8 pg (ref 26.0–34.0)
MCHC: 31.8 g/dL (ref 30.0–36.0)
MCV: 96.7 fL (ref 80.0–100.0)
Platelets: 362 10*3/uL (ref 150–400)
RBC: 3.02 MIL/uL — ABNORMAL LOW (ref 3.87–5.11)
RDW: 13 % (ref 11.5–15.5)
WBC: 10.7 10*3/uL — ABNORMAL HIGH (ref 4.0–10.5)
nRBC: 0.2 % (ref 0.0–0.2)

## 2019-11-21 LAB — MAGNESIUM: Magnesium: 1.6 mg/dL — ABNORMAL LOW (ref 1.7–2.4)

## 2019-11-21 LAB — COMPREHENSIVE METABOLIC PANEL
ALT: 9 U/L (ref 0–44)
AST: 16 U/L (ref 15–41)
Albumin: 2.4 g/dL — ABNORMAL LOW (ref 3.5–5.0)
Alkaline Phosphatase: 59 U/L (ref 38–126)
Anion gap: 8 (ref 5–15)
BUN: 22 mg/dL (ref 8–23)
CO2: 29 mmol/L (ref 22–32)
Calcium: 9.2 mg/dL (ref 8.9–10.3)
Chloride: 98 mmol/L (ref 98–111)
Creatinine, Ser: 1.02 mg/dL — ABNORMAL HIGH (ref 0.44–1.00)
GFR, Estimated: >60 mL/min (ref >60)
Glucose, Bld: 172 mg/dL — ABNORMAL HIGH (ref 70–99)
Potassium: 4.6 mmol/L (ref 3.5–5.1)
Sodium: 135 mmol/L (ref 135–145)
Total Bilirubin: 0.4 mg/dL (ref 0.3–1.2)
Total Protein: 6.3 g/dL — ABNORMAL LOW (ref 6.5–8.1)

## 2019-11-21 LAB — GLUCOSE, CAPILLARY
Glucose-Capillary: 184 mg/dL — ABNORMAL HIGH (ref 70–99)
Glucose-Capillary: 186 mg/dL — ABNORMAL HIGH (ref 70–99)
Glucose-Capillary: 165 mg/dL — ABNORMAL HIGH (ref 70–99)
Glucose-Capillary: 153 mg/dL — ABNORMAL HIGH (ref 70–99)

## 2019-11-21 MED ORDER — CEPHALEXIN 500 MG PO CAPS
500.0000 mg | ORAL_CAPSULE | Freq: Four times a day (QID) | ORAL | Status: DC
  Administered 2019-11-21 – 2019-11-25 (×16): 500 mg via ORAL
  Filled 2019-11-21 (×16): qty 1

## 2019-11-21 NOTE — Progress Notes (Signed)
Physical Therapy Treatment Patient Details Name: Pamela Knight MRN: [id number] DOB: 23-Jul-1960 Today's Date: 11/21/2019    History of Present Illness Pt admitted with hyperglycemia, weakness, and L great toe osteomyelitis.  Pt with hx of MI, CHF, DM, CAD, CABG, and neuropathy    PT Comments    Pt received awake in room. Agrees to therapy, but claims she "won't be able to do much" due to L heel pain. She is supervision to get EOB, requesting the therapist to don her L post op shoe and put on a sock on her R foot. Pt is Min G for safety for sit<>stand. Min A for ambulating in hallway with cues for keeping RW at a safe distance, keeping B UE's on RW for safety, and being mindful of the task at hand; pt has tendency to get highly distracted by surroundings in hallway. LOB x 1 during gait, due to pt letting go of RW. She claims she is unable to ambulate on L heel, however, there is no evidence to support the claim. Ambulated 80 ft; claims to be very tired upon returning to bed. Pt needs to "catch her breath".  Follow Up Recommendations  SNF;Home health PT     Equipment Recommendations  Rolling walker with 5" wheels    Recommendations for Other Services       Precautions / Restrictions Precautions Precautions: Fall Precaution Comments: Pt reports multiple previous falls Required Braces or Orthoses: Other Brace Other Brace: post Op shoe Restrictions Weight Bearing Restrictions: No LLE Weight Bearing: Weight bearing as tolerated    Mobility  Bed Mobility Overal bed mobility: Modified Independent;Needs Assistance             General bed mobility comments: self able with increased time  Transfers Overall transfer level: Needs assistance Equipment used: Rolling walker (2 wheeled) Transfers: Sit to/from [facility] Sit to Stand: Min assist Stand pivot transfers: Min guard       General transfer comment: min A for safety; she requires cues for keeping RW at  safe distance  Ambulation/Gait Ambulation/Gait assistance: Min assist Gait Distance (Feet): 80 Feet Assistive device: Rolling walker (2 wheeled) Gait Pattern/deviations: Step-through pattern;Shuffle;Trunk flexed;Decreased stride length Gait velocity: decr   General Gait Details: cues to avoid getting distracted by surroundings and letting go of RW; LOB x 1 when B UE's were removed from walker. Pt instructed on proper safety precautions with RW   Stairs             Wheelchair Mobility    Modified Rankin (Stroke Patients Only)       Balance                                            Cognition Arousal/Alertness: Awake/alert Behavior During Therapy: WFL for tasks assessed/performed Overall Cognitive Status: Within Functional Limits for tasks assessed                                 General Comments: AxO x 3      Exercises      General Comments        Pertinent Vitals/Pain Pain Assessment: Pt denies pain Pain Intervention(s): Monitored during session    Home Living  Prior Function            PT Goals (current goals can now be found in the care plan section) Acute Rehab PT Goals Patient Stated Goal: Regain IND PT Goal Formulation: With patient Time For Goal Achievement: 11/30/19 Potential to Achieve Goals: Good Progress towards PT goals: Progressing toward goals    Frequency    Min 3X/week      PT Plan Current plan remains appropriate    Co-evaluation              AM-PAC PT "6 Clicks" Mobility   Outcome Measure  Help needed turning from your back to your side while in a flat bed without using bedrails?: None Help needed moving from lying on your back to sitting on the side of a flat bed without using bedrails?: None Help needed moving to and from a bed to a chair (including a wheelchair)?: None Help needed standing up from a chair using your arms (e.g., wheelchair or bedside  chair)?: None Help needed to walk in hospital room?: A Little Help needed climbing 3-5 steps with a railing? : A Little 6 Click Score: 22    End of Session Equipment Utilized During Treatment: Gait belt Activity Tolerance: Patient tolerated treatment well Patient left: in chair;with call bell/phone within reach;with chair alarm set Nurse Communication: Mobility status PT Visit Diagnosis: Muscle weakness (generalized) (M62.81);Difficulty in walking, not elsewhere classified (R26.2);Unsteadiness on feet (R26.81)     Time:  -     Charges:                        Juel Burrow, [place] [facility] [phone]

## 2019-11-21 NOTE — Progress Notes (Signed)
PROGRESS NOTE  Pamela Knight INO:[id number] DOB: 04-Jan-1961 DOA: 11/15/2019 PCP: Patient, No Pcp Per   LOS: 6 days   Brief Narrative / Interim history: 59 year old female with CAD, history of CABG, CHF, DM 2, HTN, HLD, depression who came into the hospital with generalized weakness over the last several weeks, intermittent fever or chills.  She used to live in [place], section 8 housing, however apparently lost that and moved to [place].  Currently living in a motel.  Reports that her left great toe has had a wound which is progressively getting worse with increased pain and increased drainage.  Imaging in the ER showed osteomyelitis.  Orthopedic surgery was consulted underwent toe amputation 11/12.  Cultures showed MSSA  Subjective / 24h Interval events: No complaints today.  Assessment & Plan: Principal Problem Osteomyelitis of the left great toe -Patient was placed on broad-spectrum antibiotics, cultures were sent and speciated staph aureus, MSSA. On Ancef now, convert to oral antibiotics today. Orthopedic surgery was consulted, patient underwent an MRI which confirmed osteomyelitis.  Status post toe amputation on 11/12, intraoperative cultures showing MSSA.  Blood cultures have remained negative  Active Problems Type 2 diabetes mellitus, insulin-dependent -Continue Lantus and NovoLog, continue gabapentin for neuropathy.  -Lantus was increased yesterday, fasting CBG better this morning.  Keep on same regimen  CBG (last 3)  Recent Labs    11/20/19 1628 11/20/19 2120 11/21/19 0735  GLUCAP 165* 237* 184*   Pseudohyponatremia -Due to elevated CBGs  Hyperlipidemia -Continue Lipitor  Coronary artery disease, history of CABG -No chest pain, slightly elevated troponin but very low levels, flat, not in a pattern consistent with ACS.    Likely demand ischemia  Presumed chronic diastolic CHF -Appears euvolemic, no access to prior echoes  Presumed chronic kidney disease  stage IIIa -Creatinine appears at her baseline, creatinine is stable, check again in the morning  Essential hypertension -Continue to hold lisinopril.  Blood pressure stable this morning  Social situation/disposition -Consult TOC, patient has out-of-state Medicaid and currently no good long-term living situation.  She is essentially homeless.  Physical therapy recommended SNF, placement is pending so currently not a safe discharge avenue found  Moderate malnutrition -Appreciate dietician input  Scheduled Meds: . aspirin EC  81 mg Oral Daily  . atorvastatin  40 mg Oral Daily  . enoxaparin (LOVENOX) injection  40 mg Subcutaneous Q24H  . feeding supplement (GLUCERNA SHAKE)  237 mL Oral TID BM  . fenofibrate  54 mg Oral Daily  . gabapentin  800 mg Oral TID  . insulin aspart  0-15 Units Subcutaneous TID WC  . insulin aspart  0-5 Units Subcutaneous QHS  . insulin aspart  3 Units Subcutaneous TID WC  . insulin glargine  40 Units Subcutaneous QHS  . lamoTRIgine  100 mg Oral BID  . metFORMIN  500 mg Oral BID WC  . metoprolol tartrate  50 mg Oral Daily  . multivitamin with minerals  1 tablet Oral Daily   Continuous Infusions: .  ceFAZolin (ANCEF) IV 2 g (11/21/19 0905)   PRN Meds:.acetaminophen **OR** acetaminophen, ondansetron **OR** ondansetron (ZOFRAN) IV  Diet Orders (From admission, onward)    Start     Ordered   11/17/19 1744  Diet Carb Modified Fluid consistency: Thin; Room service appropriate? Yes  Diet effective now       Question Answer Comment  Diet-HS Snack? Nothing   Calorie Level Medium 1600-2000   Fluid consistency: Thin   Room service appropriate? Yes  11/17/19 1744          DVT prophylaxis: enoxaparin (LOVENOX) injection 40 mg Start: 11/15/19 2200     Code Status: DNR  Family Communication: No family at bedside  Status is: Inpatient  Remains inpatient appropriate because:Patient stable for discharge   Dispo: The patient is from: Home               Anticipated d/c is to: TBD              Anticipated d/c date is: 3 days              Patient currently is medically stable to d/c.  Consultants:  Orthopedic surgery   Procedures:  None   Microbiology  Wound cultures-MSSA Blood cultures-negative  Antimicrobials: Vancomycin, Zosyn, 11/10-11/14 Ancef 11/14-11/16 Keflex 11/16 >> plan for 4 additional days for 10 days total   Objective: Vitals:   11/20/19 0456 11/20/19 1409 11/20/19 2112 11/21/19 0515  BP: 125/65 113/65 92/76 107/65  Pulse: 71 62 63 65  Resp: 16 18 16 17   Temp: 98.6 F (37 C)  98 F (36.7 C) 98.1 F (36.7 C)  TempSrc:   Oral Oral  SpO2: 100% 100% 99% 96%  Weight:      Height:        Intake/Output Summary (Last 24 hours) at 11/21/2019 1058 Last data filed at 11/21/2019 0200 Gross per 24 hour  Intake 1400 ml  Output --  Net 1400 ml   Filed Weights   11/15/19 1755 11/16/19 0025  Weight: 81.6 kg 81.7 kg    Examination:  Constitutional: No distress, in bed Eyes:  no scleral icterus ENMT: Moist mucous membranes Neck: normal, supple Respiratory: Clear to auscultation, no wheezing, no crackles Cardiovascular: Regular rate and rhythm, no murmurs, no edema Abdomen: Soft, nontender, nondistended, bowel sounds positive Musculoskeletal: no clubbing / cyanosis.  Skin: No rashes seen Neurologic: No focal deficits, equal strength  Data Reviewed: I have independently reviewed following labs and imaging studies   CBC: Recent Labs  Lab 11/15/19 1430 11/16/19 0326 11/17/19 0337 11/19/19 0333 11/21/19 0331  WBC 17.9* 15.9* 13.7* 11.0* 10.7*  HGB 11.8* 11.4* 10.4* 9.5* 9.3*  HCT 34.2* 34.7* 31.1* 29.0* 29.2*  MCV 91.9 94.3 94.2 95.7 96.7  PLT 381 390 332 357 193   Basic Metabolic Panel: Recent Labs  Lab 11/15/19 1430 11/15/19 1430 11/16/19 0326 11/16/19 1311 11/17/19 0337 11/18/19 0401 11/19/19 0333 11/21/19 0331  NA 129*  --  130*  --  130*  --  133* 135  K 4.9  --  4.2  --  4.5  --   5.1 4.6  CL 91*  --  97*  --  96*  --  96* 98  CO2 26  --  24  --  26  --  29 29  GLUCOSE 334*   < > 353* 512* 339*  --  223* 172*  BUN 14  --  17  --  25*  --  25* 22  CREATININE 1.32*   < > 1.26*  --  1.08* 1.02* 1.10* 1.02*  CALCIUM 9.4  --  8.5*  --  8.3*  --  8.6* 9.2  MG  --   --   --   --   --   --   --  1.6*   < > = values in this interval not displayed.   Liver Function Tests: Recent Labs  Lab 11/15/19 1430 11/16/19 0326 11/19/19 0333 11/21/19 0331  AST 21 17 14* 16  ALT 17 14 11 9   ALKPHOS 105 92 78 59  BILITOT 0.5 0.7 0.3 0.4  PROT 7.6 6.6 5.9* 6.3*  ALBUMIN 3.3* 2.8* 2.3* 2.4*   Coagulation Profile: No results for input(s): INR, PROTIME in the last 168 hours. HbA1C: No results for input(s): HGBA1C in the last 72 hours. CBG: Recent Labs  Lab 11/20/19 0746 11/20/19 1130 11/20/19 1628 11/20/19 2120 11/21/19 0735  GLUCAP 247* 229* 165* 237* 184*    Recent Results (from the past 240 hour(s))  Respiratory Panel by RT PCR (Flu A&B, Covid) - Nasopharyngeal Swab     Status: None   Collection Time: 11/15/19  4:33 PM   Specimen: Nasopharyngeal Swab  Result Value Ref Range Status   SARS Coronavirus 2 by RT PCR NEGATIVE NEGATIVE Final    Comment: (NOTE) SARS-CoV-2 target nucleic acids are NOT DETECTED.  The SARS-CoV-2 RNA is generally detectable in upper respiratoy specimens during the acute phase of infection. The lowest concentration of SARS-CoV-2 viral copies this assay can detect is 131 copies/mL. A negative result does not preclude SARS-Cov-2 infection and should not be used as the sole basis for treatment or other patient management decisions. A negative result may occur with  improper specimen collection/handling, submission of specimen other than nasopharyngeal swab, presence of viral mutation(s) within the areas targeted by this assay, and inadequate number of viral copies (<131 copies/mL). A negative result must be combined with  clinical observations, patient history, and epidemiological information. The expected result is Negative.  Fact Sheet for Patients:  PinkCheek.be  Fact Sheet for Healthcare Providers:  GravelBags.it  This test is no t yet approved or cleared by the [place] FDA and  has been authorized for detection and/or diagnosis of SARS-CoV-2 by FDA under an Emergency Use Authorization (EUA). This EUA will remain  in effect (meaning this test can be used) for the duration of the COVID-19 declaration under Section 564(b)(1) of the Act, 21 U.S.C. section 360bbb-3(b)(1), unless the authorization is terminated or revoked sooner.     Influenza A by PCR NEGATIVE NEGATIVE Final   Influenza B by PCR NEGATIVE NEGATIVE Final    Comment: (NOTE) The Xpert Xpress SARS-CoV-2/FLU/RSV assay is intended as an aid in  the diagnosis of influenza from Nasopharyngeal swab specimens and  should not be used as a sole basis for treatment. Nasal washings and  aspirates are unacceptable for Xpert Xpress SARS-CoV-2/FLU/RSV  testing.  Fact Sheet for Patients: PinkCheek.be  Fact Sheet for Healthcare Providers: GravelBags.it  This test is not yet approved or cleared by the [place] FDA and  has been authorized for detection and/or diagnosis of SARS-CoV-2 by  FDA under an Emergency Use Authorization (EUA). This EUA will remain  in effect (meaning this test can be used) for the duration of the  Covid-19 declaration under Section 564(b)(1) of the Act, 21  U.S.C. section 360bbb-3(b)(1), unless the authorization is  terminated or revoked. Performed at [facility], [place] [address] [place], [place] [address]   Culture, blood (routine x 2)     Status: None   Collection Time: 11/15/19  4:45 PM   Specimen: BLOOD  Result Value Ref Range Status   Specimen Description   Final     BLOOD SITE NOT SPECIFIED Performed at [facility], [address] [place], [place] [address]    Special Requests   Final    BOTTLES DRAWN AEROBIC AND ANAEROBIC Blood Culture adequate volume  Performed at [facility], [place] [address], [place], [place] [address]    Culture   Final    NO GROWTH 5 DAYS Performed at [facility], [place] [address] [place], [place] [address]    Report Status 11/20/2019 FINAL  Final  Wound or Superficial Culture     Status: None   Collection Time: 11/15/19  4:56 PM   Specimen: Toe; Wound  Result Value Ref Range Status   Specimen Description   Final    TOE LEFT Performed at [place] [place] [address], [place], [place] [address]    Special Requests   Final    NONE Performed at [facility], [place] [address], [place], [place] [address]    Gram Stain   Final    RARE WBC PRESENT, PREDOMINANTLY PMN FEW GRAM POSITIVE COCCI Performed at [facility], [place] [address] [place], [place] [address]    Culture ABUNDANT STAPHYLOCOCCUS AUREUS  Final   Report Status 11/18/2019 FINAL  Final   Organism ID, Bacteria STAPHYLOCOCCUS AUREUS  Final      Susceptibility   Staphylococcus aureus - MIC*    CIPROFLOXACIN <=0.5 SENSITIVE Sensitive     ERYTHROMYCIN <=0.25 SENSITIVE Sensitive     GENTAMICIN <=0.5 SENSITIVE Sensitive     OXACILLIN <=0.25 SENSITIVE Sensitive     TETRACYCLINE <=1 SENSITIVE Sensitive     VANCOMYCIN <=0.5 SENSITIVE Sensitive     TRIMETH/SULFA <=10 SENSITIVE Sensitive     CLINDAMYCIN >=8 RESISTANT Resistant     RIFAMPIN <=0.5 SENSITIVE Sensitive     Inducible Clindamycin NEGATIVE Sensitive     * ABUNDANT STAPHYLOCOCCUS AUREUS  Aerobic/Anaerobic Culture (surgical/deep wound)     Status: None (Preliminary result)   Collection Time: 11/17/19  3:39 PM   Specimen: Soft Tissue, Other  Result Value Ref Range Status   Specimen Description   Final    TOE LEFT GREAT TOE Performed at  [place] [address], [place], [place] [address]    Special Requests   Final    NONE Performed at [facility], [place] [address], [place], [place] [address]    Gram Stain   Final    FEW WBC PRESENT, PREDOMINANTLY PMN RARE GRAM POSITIVE COCCI Performed at [facility], [place] [address] [place], [place] [address]    Culture   Final    FEW STAPHYLOCOCCUS AUREUS NO ANAEROBES ISOLATED; CULTURE IN PROGRESS FOR 5 DAYS    Report Status PENDING  Incomplete   Organism ID, Bacteria STAPHYLOCOCCUS AUREUS  Final      Susceptibility   Staphylococcus aureus - MIC*    CIPROFLOXACIN <=0.5 SENSITIVE Sensitive     ERYTHROMYCIN <=0.25 SENSITIVE Sensitive     GENTAMICIN <=0.5 SENSITIVE Sensitive     OXACILLIN <=0.25 SENSITIVE Sensitive     TETRACYCLINE <=1 SENSITIVE Sensitive     VANCOMYCIN <=0.5 SENSITIVE Sensitive     TRIMETH/SULFA <=10 SENSITIVE Sensitive     CLINDAMYCIN >=8 RESISTANT Resistant     RIFAMPIN <=0.5 SENSITIVE Sensitive     Inducible Clindamycin NEGATIVE Sensitive     * FEW STAPHYLOCOCCUS AUREUS     Radiology Studies: No results found.  Marzetta Board, MD, PhD [facility]  Between 7 am - 7 pm I am available, please contact me via Amion or Securechat  Between 7 pm - 7 am I am not available, please contact night coverage MD/APP via Amion

## 2019-11-21 NOTE — Progress Notes (Signed)
Patient temp 101.5. Tylenol given and IS encouraged. MD notified.

## 2019-11-21 NOTE — Plan of Care (Signed)
  Problem: Education: Goal: Ability to describe self-care measures that may prevent or decrease complications (Diabetes Survival Skills Education) will improve Outcome: Progressing   Problem: Coping: Goal: Ability to adjust to condition or change in health will improve Outcome: Progressing   Problem: Fluid Volume: Goal: Ability to maintain a balanced intake and output will improve Outcome: Progressing   Problem: Health Behavior/Discharge Planning: Goal: Ability to identify and utilize available resources and services will improve Outcome: Progressing Goal: Ability to manage health-related needs will improve Outcome: Progressing   Problem: Nutritional: Goal: Maintenance of adequate nutrition will improve Outcome: Progressing Goal: Progress toward achieving an optimal weight will improve Outcome: Progressing   Problem: Skin Integrity: Goal: Risk for impaired skin integrity will decrease Outcome: Progressing   Problem: Tissue Perfusion: Goal: Adequacy of tissue perfusion will improve Outcome: Progressing   Problem: Education: Goal: Knowledge of General Education information will improve Description: Including pain rating scale, medication(s)/side effects and non-pharmacologic comfort measures Outcome: Progressing   Problem: Health Behavior/Discharge Planning: Goal: Ability to manage health-related needs will improve Outcome: Progressing   Problem: Clinical Measurements: Goal: Ability to maintain clinical measurements within normal limits will improve Outcome: Progressing Goal: Will remain free from infection Outcome: Progressing Goal: Diagnostic test results will improve Outcome: Progressing Goal: Respiratory complications will improve Outcome: Progressing Goal: Cardiovascular complication will be avoided Outcome: Progressing   Problem: Activity: Goal: Risk for activity intolerance will decrease Outcome: Progressing   Problem: Nutrition: Goal: Adequate nutrition  will be maintained Outcome: Progressing   Problem: Coping: Goal: Level of anxiety will decrease Outcome: Progressing

## 2019-11-21 NOTE — TOC Progression Note (Signed)
Transition of Care [facility]) - Progression Note    Patient Details  Name: Pamela Knight MRN: [id number] Date of Birth: [birth year]  Transition of Care [facility]) CM/SW [place], [place] Phone Number: 11/21/2019, 2:19 PM  Clinical Narrative:    Patient now agreeable to SNF rehab in the [place] area.  Patient agreeable to facilities outside of the [place] area. Patient friend Wyatt Portela  provided SNF options in the [place] area and encouraged the patient to pursue SNF for her safety.  CSW made a referral to [facility] review [place]- No medicaid beds [facility]-Under review.     Expected Discharge Plan: Skilled Nursing Facility Barriers to Discharge: SNF Pending bed offer  Expected Discharge Plan and Services Expected Discharge Plan: [place] In-house Referral: Clinical Social Work Discharge Planning Services: CM Consult Post Acute Care Choice: [place] arrangements for the past 2 months: Homeless, Single Family Home Expected Discharge Date: 11/21/19                                     Social Determinants of Health (SDOH) Interventions    Readmission Risk Interventions No flowsheet data found.

## 2019-11-22 DIAGNOSIS — N179 Acute kidney failure, unspecified: Secondary | ICD-10-CM

## 2019-11-22 DIAGNOSIS — E440 Moderate protein-calorie malnutrition: Secondary | ICD-10-CM | POA: Diagnosis not present

## 2019-11-22 DIAGNOSIS — M86172 Other acute osteomyelitis, left ankle and foot: Secondary | ICD-10-CM | POA: Diagnosis not present

## 2019-11-22 LAB — GLUCOSE, CAPILLARY
Glucose-Capillary: 197 mg/dL — ABNORMAL HIGH (ref 70–99)
Glucose-Capillary: 209 mg/dL — ABNORMAL HIGH (ref 70–99)
Glucose-Capillary: 230 mg/dL — ABNORMAL HIGH (ref 70–99)
Glucose-Capillary: 184 mg/dL — ABNORMAL HIGH (ref 70–99)

## 2019-11-22 LAB — CBC
HCT: 31 % — ABNORMAL LOW (ref 36.0–46.0)
Hemoglobin: 9.8 g/dL — ABNORMAL LOW (ref 12.0–15.0)
MCH: 31.6 pg (ref 26.0–34.0)
MCHC: 31.6 g/dL (ref 30.0–36.0)
MCV: 100 fL (ref 80.0–100.0)
Platelets: 405 10*3/uL — ABNORMAL HIGH (ref 150–400)
RBC: 3.1 MIL/uL — ABNORMAL LOW (ref 3.87–5.11)
RDW: 13.1 % (ref 11.5–15.5)
WBC: 11.6 10*3/uL — ABNORMAL HIGH (ref 4.0–10.5)
nRBC: 0 % (ref 0.0–0.2)

## 2019-11-22 LAB — COMPREHENSIVE METABOLIC PANEL
ALT: 7 U/L (ref 0–44)
AST: 20 U/L (ref 15–41)
Albumin: 2.9 g/dL — ABNORMAL LOW (ref 3.5–5.0)
Alkaline Phosphatase: 61 U/L (ref 38–126)
Anion gap: 9 (ref 5–15)
BUN: 21 mg/dL (ref 8–23)
CO2: 26 mmol/L (ref 22–32)
Calcium: 9.1 mg/dL (ref 8.9–10.3)
Chloride: 97 mmol/L — ABNORMAL LOW (ref 98–111)
Creatinine, Ser: 1.06 mg/dL — ABNORMAL HIGH (ref 0.44–1.00)
GFR, Estimated: 60 mL/min — ABNORMAL LOW (ref >60)
Glucose, Bld: 212 mg/dL — ABNORMAL HIGH (ref 70–99)
Potassium: 4.7 mmol/L (ref 3.5–5.1)
Sodium: 132 mmol/L — ABNORMAL LOW (ref 135–145)
Total Bilirubin: 0.4 mg/dL (ref 0.3–1.2)
Total Protein: 6.5 g/dL (ref 6.5–8.1)

## 2019-11-22 LAB — AEROBIC/ANAEROBIC CULTURE W GRAM STAIN (SURGICAL/DEEP WOUND)

## 2019-11-22 LAB — RESPIRATORY PANEL BY RT PCR (FLU A&B, COVID)
Influenza A by PCR: NEGATIVE
Influenza B by PCR: NEGATIVE
SARS Coronavirus 2 by RT PCR: NEGATIVE

## 2019-11-22 LAB — MAGNESIUM: Magnesium: 1.8 mg/dL (ref 1.7–2.4)

## 2019-11-22 MED ORDER — HYDROCORTISONE (PERIANAL) 2.5 % EX CREA
TOPICAL_CREAM | Freq: Four times a day (QID) | CUTANEOUS | Status: DC
  Filled 2019-11-22: qty 28.35

## 2019-11-22 NOTE — Plan of Care (Signed)
  Problem: Education: Goal: Ability to describe self-care measures that may prevent or decrease complications (Diabetes Survival Skills Education) will improve Outcome: Progressing   Problem: Coping: Goal: Ability to adjust to condition or change in health will improve Outcome: Progressing   Problem: Health Behavior/Discharge Planning: Goal: Ability to identify and utilize available resources and services will improve Outcome: Progressing   Problem: Health Behavior/Discharge Planning: Goal: Ability to manage health-related needs will improve Outcome: Progressing

## 2019-11-22 NOTE — Progress Notes (Signed)
Nutrition Follow-up  DOCUMENTATION CODES:   Non-severe (moderate) malnutrition in context of social or environmental circumstances  INTERVENTION:  - continue Glucerna Shake TID.  NUTRITION DIAGNOSIS:   Moderate Malnutrition related to social / environmental circumstances as evidenced by mild fat depletion, mild muscle depletion. -ongoing  GOAL:   Patient will meet greater than or equal to 90% of their needs -variably met  MONITOR:   PO intake, Supplement acceptance, Weight trends, Labs, I & O's  ASSESSMENT:   Pt admitted with osteomyelitis of the L great toe. PMH significant for CAD with h/o CABG, CHF, DM2, HTN, HLD, depression.  Patient has been eating variably: 25-90% at meals. She has been accepting Glucerna Shake 50% of the time offered. She has not been weighed since 11/11 at which time she weighed 180 lb.  Patient reports living in a motel. She has several complaints about the hotel including cockroaches which get into the small refrigerator, a bed that is not to her liking, and that no one brings her food while she is there and needing to walk to a [employer] or gas station that is close by. Some days she does not feel like walking and gets very depressed so she may only eat one time on those days.   She opens mouth when RD asks about any chewing difficulties. She initially says she has no problems, but then discusses the extent of tooth decay she has and how she is unable to eat fruit unless it is soft and can't eat things that are hard or very crunchy.   Patient has been frustrated with the meals here d/t expecting meals to be hot and that when they are delivered they are sometimes not hot.   Per notes: - osteomyelitis of L great toe s/p amputation 11/12 - stage 3 CKD - moderate malnutrition - pending safe discharge plan    Labs reviewed; CBGs: 197 and 209 mg/dl, Na: 132 mmol/l, Cl: 97 mmol/l, creatinine: 1.06 mg/dl, GFR: 60 mg/dl.  Medications reviewed; sliding  scale novolog, 3 units novolog TID, 40 units lantus/day, 500 mg glucophage BID, 1 tablet multivitamin with minerals/day.    Diet Order:   Diet Order            Diet Carb Modified Fluid consistency: Thin; Room service appropriate? Yes  Diet effective now                 EDUCATION NEEDS:   No education needs have been identified at this time  Skin:  Skin Assessment: Skin Integrity Issues: Skin Integrity Issues:: Incisions Incisions: L foot (11/12) Other: non-pressure wound L great toe  Last BM:  11/17 (type 6 x2)  Height:   Ht Readings from Last 1 Encounters:  11/16/19 $RemoveB'5\' 2"'[id number]$  (1.575 m)    Weight:   Wt Readings from Last 1 Encounters:  11/16/19 81.7 kg    Estimated Nutritional Needs:  Kcal:  1750-1950 Protein:  90-105 grams Fluid:  >/= 1.7 L/day     Jarome Matin, MS, RD, LDN, CNSC Inpatient Clinical Dietitian RD pager # available in AMION  After hours/weekend pager # available in [facility]

## 2019-11-22 NOTE — Progress Notes (Signed)
Inpatient Diabetes Program Recommendations  AACE/ADA: New Consensus Statement on Inpatient Glycemic Control (2015)  Target Ranges:  Prepandial:   less than 140 mg/dL      Peak postprandial:   less than 180 mg/dL (1-2 hours)      Critically ill patients:  140 - 180 mg/dL   Lab Results  Component Value Date   GLUCAP 209 (H) 11/22/2019   HGBA1C 14.7 (H) 11/15/2019    Review of Glycemic Control  Diabetes history: DM2 Outpatient Diabetes medications: Lantus 60 units QHS, Novolog 10-16 units tidwc, metformin 500 mg bid  Current orders for Inpatient glycemic control: Lantus 40 units QHS, Novolog 0-15 units tidwc and hs + 3 units tidwc, metformin 500 mg bid  HgbA1C - 14.7% To be d/ced to SNF.  Inpatient Diabetes Program Recommendations:     Increase Lantus to 42 units QHS If post-prandials > 180 mg/dL, increase Novolog to 5 units tidwc.  Will continue to follow.   Thank you. Lorenda Peck, RD, LDN, CDE Inpatient Diabetes Coordinator [phone]

## 2019-11-22 NOTE — Progress Notes (Signed)
Bright red blood found in toilet no stool involved. Pt notes she passed gas and felt relief. No c/p of pain. External hemorroids noted on assessment. H&H 9.8/31, BP 100/63, HR 71. MD notified.

## 2019-11-22 NOTE — TOC Initial Note (Signed)
Transition of Care [facility]) - Initial/Assessment Note    Patient Details  Name: CYBELE MAULE MRN: [id number] Date of Birth: September 23, 1960  [facility] CM/SW Contact:    Lia Hopping, [place] Phone Number: 11/22/2019, 4:35 PM  Clinical Narrative:                 Patient does not have snf bed offers at this time. CSW reached out to [facility] in [place]. Information still under review. Facility reports patient will need a PCR covid screening test if accepted. CSW notified the physician.   Expected Discharge Plan: Skilled Nursing Facility Barriers to Discharge: SNF Pending bed offer   Patient Goals and CMS Choice   CMS Medicare.gov Compare Post Acute Care list provided to:: Patient Choice offered to / list presented to : Patient  Expected Discharge Plan and Services Expected Discharge Plan: [place] In-house Referral: Clinical Social Work Discharge Planning Services: CM Consult Post Acute Care Choice: [place] Living arrangements for the past 2 months: Homeless, [place] Expected Discharge Date: 11/21/19                                    Prior Living Arrangements/Services Living arrangements for the past 2 months: Homeless, [place] Lives with:: Self Patient language and need for interpreter reviewed:: No        Need for Family Participation in Patient Care: Yes (Comment) Care giver support system in place?: Yes (comment)   Criminal Activity/Legal Involvement Pertinent to Current Situation/Hospitalization: No - Comment as needed  Activities of Daily Living Home Assistive Devices/Equipment: Cane (specify quad or straight) ADL Screening (condition at time of admission) Patient's cognitive ability adequate to safely complete daily activities?: Yes Is the patient deaf or have difficulty hearing?: No Does the patient have difficulty seeing, even when wearing glasses/contacts?: No Does the patient have  difficulty concentrating, remembering, or making decisions?: No Patient able to express need for assistance with ADLs?: Yes Does the patient have difficulty dressing or bathing?: No Independently performs ADLs?: Yes (appropriate for developmental age) Does the patient have difficulty walking or climbing stairs?: Yes Weakness of Legs: Both Weakness of Arms/Hands: Both  Permission Sought/Granted   Permission granted to share information with : Yes, Verbal Permission Granted              Emotional Assessment Appearance:: Appears stated age Attitude/Demeanor/Rapport: Engaged Affect (typically observed): Accepting Orientation: : Oriented to Self, Oriented to Place, Oriented to  Time, Oriented to Situation Alcohol / Substance Use: Not Applicable Psych Involvement: No (comment)  Admission diagnosis:  Osteomyelitis ([place]) [M86.9] AKI (acute kidney injury) ([place]) [N17.9] Acute osteomyelitis of toe of left foot [facility]) [T03.546] Patient Active Problem List   Diagnosis Date Noted  . Malnutrition of moderate degree 11/19/2019  . Osteomyelitis ([place]) 11/15/2019  . Personal history of noncompliance with medical treatment, presenting hazards to health 12/26/2015  . Uncontrolled type 2 diabetes mellitus with stage 2 chronic kidney disease, with long-term current use of insulin ([place]) 12/18/2015  . Essential hypertension, benign 12/18/2015  . Hypercholesteremia 12/18/2015  . DM type 2 causing vascular disease ([place]) 12/18/2015   PCP:  Patient, No Pcp Per Pharmacy:   [id number] - [place], [place] [address] [place] [address] Phone: [phone] Fax: [phone]     Social Determinants of Health (  SDOH) Interventions    Readmission Risk Interventions No flowsheet data found.

## 2019-11-22 NOTE — Progress Notes (Signed)
PROGRESS NOTE    Pamela Knight  ZOX:[id number] DOB: [birth year] DOA: 11/15/2019 PCP: Patient, No Pcp Per    Chief Complaint  Patient presents with  . Fever  . DM ulcer    Brief Narrative: 59 year old lady with prior history of coronary artery disease s/p CABG, diabetes mellitus, hypertension, hyperlipidemia presents to ED for generalized weakness, fevers and chills patient reports that she lives at section 8 housing in [place], apparently lost her housing and moved to [place] she was currently living in a motel prior to admission.  On arrival to ED she was found to have a left great toe wound progressively getting worse with increased pain and foul-smelling drainage.  Imaging of the toe showed osteomyelitis.  Orthopedic surgery comes consulted underwent toe amputation on 11/17/2019.   Wound cultures from the wound show MSSA   Assessment & Plan:   Active Problems:   Osteomyelitis ([place])   Malnutrition of moderate degree   Osteomyelitis of the great toe S/p amputation by orthopedic surgery on 11/17/2019.  Intraoperative cultures showing MSSA. Blood cultures have been negative.  Complete the course of oral antibiotics.    Type 2 diabetes mellitus CBG (last 3)  Recent Labs    11/21/19 1623 11/21/19 2200 11/22/19 0724  GLUCAP 165* 153* 197*   CBGs are well controlled.  Continue with Lantus and sliding scale insulin.  No changes.    Pseudohyponatremia probably secondary to elevated CBGs.    Hyperlipidemia Continue with the Lipitor  Hypertension Continue with metoprolol   Presumed chronic diastolic heart failure Patient appears to be euvolemic.     Stage IIIa CKD Creatinine appears to be at baseline.    Moderate malnutrition Nutrition consulted.  Hypomagnesemia Replaced    Disposition Patient has out-of-state Medicaid and currently no long-term living situation at this time PT evaluation recommending SNF.  Currently no safe discharge/  disposition found.   TOC on board and looking for placement.  DVT prophylaxis: (Lovenox Code Status: DNR Family Communication: (None at bedside Disposition:   Status is: Inpatient  Remains inpatient appropriate because:Unsafe d/c plan   Dispo: The patient is from: Home              Anticipated d/c is to: Pending              Anticipated d/c date is: 1 day              Patient currently is medically stable to d/c.       Consultants:   Orthopedics   Procedures:    Antimicrobials: none.    Subjective: No new complaints  Objective: Vitals:   11/21/19 2158 11/21/19 2338 11/22/19 0456 11/22/19 1000  BP: 105/61  100/63 118/69  Pulse: 80  71 66  Resp: 17  16 16   Temp: (!) 101.4 F (38.6 C) 99 F (37.2 C) 98 F (36.7 C)   TempSrc: Oral Oral Oral Oral  SpO2: 94%  100% 99%  Weight:      Height:        Intake/Output Summary (Last 24 hours) at 11/22/2019 1125 Last data filed at 11/21/2019 2200 Gross per 24 hour  Intake 360 ml  Output --  Net 360 ml   Filed Weights   11/15/19 1755 11/16/19 0025  Weight: 81.6 kg 81.7 kg    Examination:  General exam: Appears calm and comfortable  Respiratory system: Clear to auscultation. Respiratory effort normal. Cardiovascular system: S1 & S2 heard, RRR. No JVD, No pedal edema. Gastrointestinal  system: Abdomen is nondistended, soft and nontender.t. Normal bowel sounds heard. Central nervous system: Alert and oriented. No focal neurological deficits. Extremities: RIGHT TOE AMPUTATION, BANDAGED.  Skin: No rashes, lesions or ulcers Psychiatry:  Mood & affect appropriate.     Data Reviewed: I have personally reviewed following labs and imaging studies  CBC: Recent Labs  Lab 11/16/19 0326 11/17/19 0337 11/19/19 0333 11/21/19 0331 11/22/19 0343  WBC 15.9* 13.7* 11.0* 10.7* 11.6*  HGB 11.4* 10.4* 9.5* 9.3* 9.8*  HCT 34.7* 31.1* 29.0* 29.2* 31.0*  MCV 94.3 94.2 95.7 96.7 100.0  PLT 390 332 357 362 405*     Basic Metabolic Panel: Recent Labs  Lab 11/16/19 0326 11/16/19 0326 11/16/19 1311 11/17/19 0337 11/18/19 0401 11/19/19 0333 11/21/19 0331 11/22/19 0343  NA 130*  --   --  130*  --  133* 135 132*  K 4.2  --   --  4.5  --  5.1 4.6 4.7  CL 97*  --   --  96*  --  96* 98 97*  CO2 24  --   --  26  --  29 29 26   GLUCOSE 353*   < > 512* 339*  --  223* 172* 212*  BUN 17  --   --  25*  --  25* 22 21  CREATININE 1.26*   < >  --  1.08* 1.02* 1.10* 1.02* 1.06*  CALCIUM 8.5*  --   --  8.3*  --  8.6* 9.2 9.1  MG  --   --   --   --   --   --  1.6* 1.8   < > = values in this interval not displayed.    GFR: Estimated Creatinine Clearance: 55.2 mL/min (A) (by C-G formula based on SCr of 1.06 mg/dL (H)).  Liver Function Tests: Recent Labs  Lab 11/15/19 1430 11/16/19 0326 11/19/19 0333 11/21/19 0331 11/22/19 0343  AST 21 17 14* 16 20  ALT 17 14 11 9 7   ALKPHOS 105 92 78 59 61  BILITOT 0.5 0.7 0.3 0.4 0.4  PROT 7.6 6.6 5.9* 6.3* 6.5  ALBUMIN 3.3* 2.8* 2.3* 2.4* 2.9*    CBG: Recent Labs  Lab 11/21/19 0735 11/21/19 1157 11/21/19 1623 11/21/19 2200 11/22/19 0724  GLUCAP 184* 186* 165* 153* 197*     Recent Results (from the past 240 hour(s))  Respiratory Panel by RT PCR (Flu A&B, Covid) - Nasopharyngeal Swab     Status: None   Collection Time: 11/15/19  4:33 PM   Specimen: Nasopharyngeal Swab  Result Value Ref Range Status   SARS Coronavirus 2 by RT PCR NEGATIVE NEGATIVE Final    Comment: (NOTE) SARS-CoV-2 target nucleic acids are NOT DETECTED.  The SARS-CoV-2 RNA is generally detectable in upper respiratoy specimens during the acute phase of infection. The lowest concentration of SARS-CoV-2 viral copies this assay can detect is 131 copies/mL. A negative result does not preclude SARS-Cov-2 infection and should not be used as the sole basis for treatment or other patient management decisions. A negative result may occur with  improper specimen collection/handling,  submission of specimen other than nasopharyngeal swab, presence of viral mutation(s) within the areas targeted by this assay, and inadequate number of viral copies (<131 copies/mL). A negative result must be combined with clinical observations, patient history, and epidemiological information. The expected result is Negative.  Fact Sheet for Patients:  PinkCheek.be  Fact Sheet for Healthcare Providers:  GravelBags.it  This test is no t yet  approved or cleared by the [place] and  has been authorized for detection and/or diagnosis of SARS-CoV-2 by FDA under an Emergency Use Authorization (EUA). This EUA will remain  in effect (meaning this test can be used) for the duration of the COVID-19 declaration under Section 564(b)(1) of the Act, 21 U.S.C. section 360bbb-3(b)(1), unless the authorization is terminated or revoked sooner.     Influenza A by PCR NEGATIVE NEGATIVE Final   Influenza B by PCR NEGATIVE NEGATIVE Final    Comment: (NOTE) The Xpert Xpress SARS-CoV-2/FLU/RSV assay is intended as an aid in  the diagnosis of influenza from Nasopharyngeal swab specimens and  should not be used as a sole basis for treatment. Nasal washings and  aspirates are unacceptable for Xpert Xpress SARS-CoV-2/FLU/RSV  testing.  Fact Sheet for Patients: PinkCheek.be  Fact Sheet for Healthcare Providers: GravelBags.it  This test is not yet approved or cleared by the [place] FDA and  has been authorized for detection and/or diagnosis of SARS-CoV-2 by  FDA under an Emergency Use Authorization (EUA). This EUA will remain  in effect (meaning this test can be used) for the duration of the  Covid-19 declaration under Section 564(b)(1) of the Act, 21  U.S.C. section 360bbb-3(b)(1), unless the authorization is  terminated or revoked. Performed at [facility], [place] [address], [place], [place] [address]   Culture, blood (routine x 2)     Status: None   Collection Time: 11/15/19  4:45 PM   Specimen: BLOOD  Result Value Ref Range Status   Specimen Description   Final    BLOOD SITE NOT SPECIFIED Performed at [facility], [address] [place], [place] [address]    Special Requests   Final    BOTTLES DRAWN AEROBIC AND ANAEROBIC Blood Culture adequate volume Performed at [place] [address], [place], [place] [address]    Culture   Final    NO GROWTH 5 DAYS Performed at [facility], [place] [address] [place], [place] [address]    Report Status 11/20/2019 FINAL  Final  Wound or Superficial Culture     Status: None   Collection Time: 11/15/19  4:56 PM   Specimen: Toe; Wound  Result Value Ref Range Status   Specimen Description   Final    TOE LEFT Performed at [place] [address] [place], [place] [address]    Special Requests   Final    NONE Performed at [facility], [place] [address] [place], [place] [address]    Gram Stain   Final    RARE WBC PRESENT, PREDOMINANTLY PMN FEW GRAM POSITIVE COCCI Performed at [facility], [place] [address], [place], [place] [address]    Culture ABUNDANT STAPHYLOCOCCUS AUREUS  Final   Report Status 11/18/2019 FINAL  Final   Organism ID, Bacteria STAPHYLOCOCCUS AUREUS  Final      Susceptibility   Staphylococcus aureus - MIC*    CIPROFLOXACIN <=0.5 SENSITIVE Sensitive     ERYTHROMYCIN <=0.25 SENSITIVE Sensitive     GENTAMICIN <=0.5 SENSITIVE Sensitive     OXACILLIN <=0.25 SENSITIVE Sensitive     TETRACYCLINE <=1 SENSITIVE Sensitive     VANCOMYCIN <=0.5 SENSITIVE Sensitive     TRIMETH/SULFA <=10 SENSITIVE Sensitive     CLINDAMYCIN >=8 RESISTANT Resistant     RIFAMPIN <=0.5 SENSITIVE Sensitive     Inducible Clindamycin NEGATIVE Sensitive     * ABUNDANT STAPHYLOCOCCUS  AUREUS  Aerobic/Anaerobic Culture  (surgical/deep wound)     Status: None (Preliminary result)   Collection Time: 11/17/19  3:39 PM   Specimen: Soft Tissue, Other  Result Value Ref Range Status   Specimen Description   Final    TOE LEFT GREAT TOE Performed at [place] [address], [place], [place] [address]    Special Requests   Final    NONE Performed at [facility], [place] [address], [place], [place] [address]    Gram Stain   Final    FEW WBC PRESENT, PREDOMINANTLY PMN RARE GRAM POSITIVE COCCI Performed at [facility], [place] [address], [place], [place] [address]    Culture   Final    FEW STAPHYLOCOCCUS AUREUS NO ANAEROBES ISOLATED; CULTURE IN PROGRESS FOR 5 DAYS    Report Status PENDING  Incomplete   Organism ID, Bacteria STAPHYLOCOCCUS AUREUS  Final      Susceptibility   Staphylococcus aureus - MIC*    CIPROFLOXACIN <=0.5 SENSITIVE Sensitive     ERYTHROMYCIN <=0.25 SENSITIVE Sensitive     GENTAMICIN <=0.5 SENSITIVE Sensitive     OXACILLIN <=0.25 SENSITIVE Sensitive     TETRACYCLINE <=1 SENSITIVE Sensitive     VANCOMYCIN <=0.5 SENSITIVE Sensitive     TRIMETH/SULFA <=10 SENSITIVE Sensitive     CLINDAMYCIN >=8 RESISTANT Resistant     RIFAMPIN <=0.5 SENSITIVE Sensitive     Inducible Clindamycin NEGATIVE Sensitive     * FEW STAPHYLOCOCCUS AUREUS         Radiology Studies: No results found.      Scheduled Meds: . aspirin EC  81 mg Oral Daily  . atorvastatin  40 mg Oral Daily  . cephALEXin  500 mg Oral Q6H  . enoxaparin (LOVENOX) injection  40 mg Subcutaneous Q24H  . feeding supplement (GLUCERNA SHAKE)  237 mL Oral TID BM  . fenofibrate  54 mg Oral Daily  . gabapentin  800 mg Oral TID  . hydrocortisone   Rectal QID  . insulin aspart  0-15 Units Subcutaneous TID WC  . insulin aspart  0-5 Units Subcutaneous QHS  . insulin aspart  3 Units Subcutaneous TID WC  . insulin glargine  40 Units Subcutaneous QHS  . lamoTRIgine  100 mg Oral BID  .  metFORMIN  500 mg Oral BID WC  . metoprolol tartrate  50 mg Oral Daily  . multivitamin with minerals  1 tablet Oral Daily   Continuous Infusions:   LOS: 7 days        Hosie Poisson, MD [facility]   To contact the attending provider between 7A-7P or the covering provider during after hours 7P-7A, please log into the web site [email or web address] and access using universal [place] password for that web site. If you do not have the password, please call the hospital operator.  11/22/2019, 11:25 AM

## 2019-11-23 LAB — GLUCOSE, CAPILLARY
Glucose-Capillary: 206 mg/dL — ABNORMAL HIGH (ref 70–99)
Glucose-Capillary: 255 mg/dL — ABNORMAL HIGH (ref 70–99)
Glucose-Capillary: 219 mg/dL — ABNORMAL HIGH (ref 70–99)
Glucose-Capillary: 196 mg/dL — ABNORMAL HIGH (ref 70–99)

## 2019-11-23 MED ORDER — HYDROCORTISONE (PERIANAL) 2.5 % EX CREA
TOPICAL_CREAM | Freq: Four times a day (QID) | CUTANEOUS | 0 refills | Status: DC
Start: 2019-11-23 — End: 2019-12-29

## 2019-11-23 MED ORDER — INSULIN ASPART 100 UNIT/ML ~~LOC~~ SOLN
3.0000 [IU] | Freq: Three times a day (TID) | SUBCUTANEOUS | 11 refills | Status: DC
Start: 2019-11-23 — End: 2020-01-02

## 2019-11-23 MED ORDER — INSULIN ASPART 100 UNIT/ML ~~LOC~~ SOLN: SUBCUTANEOUS | 11 refills | Status: AC

## 2019-11-23 MED ORDER — GLUCERNA SHAKE PO LIQD: 237.0000 mL | Freq: Three times a day (TID) | ORAL | 0 refills | Status: AC

## 2019-11-23 MED ORDER — INSULIN ASPART 100 UNIT/ML ~~LOC~~ SOLN
5.0000 [IU] | Freq: Three times a day (TID) | SUBCUTANEOUS | Status: DC
  Administered 2019-11-23 – 2019-11-25 (×6): 5 [IU] via SUBCUTANEOUS

## 2019-11-23 MED ORDER — CEPHALEXIN 500 MG PO CAPS: 500.0000 mg | ORAL_CAPSULE | Freq: Two times a day (BID) | ORAL | 0 refills | Status: DC

## 2019-11-23 NOTE — Progress Notes (Signed)
Results for KATTI, PELLE (MRN [id number]) as of 11/23/2019 12:58  Ref. Range 11/22/2019 12:00 11/22/2019 16:31 11/22/2019 21:18 11/23/2019 07:21 11/23/2019 12:38  Glucose-Capillary Latest Ref Range: 70 - 99 mg/dL 209 (H) 230 (H) 184 (H) 206 (H) 255 (H)  Noted that blood sugars have been greater than 180 mg/dl.  Recommend increasing Novolog to 5 units TID with meals if eating at least 50% of meal.  Harvel Ricks RN BSN CDE Diabetes Coordinator Pager: [phone]  8am-5pm

## 2019-11-23 NOTE — Discharge Summary (Addendum)
Physician Discharge Summary  Pamela Knight OZD:[id number] DOB: [birth year] DOA: 11/15/2019  PCP: Patient, No Pcp Per  Admit date: 11/15/2019 Discharge date: 11/23/2019  Admitted From: Home.  Disposition: Home.   Recommendations for Outpatient Follow-up:  1. Follow up with PCP in 1-2 weeks 2. Please obtain BMP/CBC in one week Please follow up with orthopedics as needed/recommended.   Discharge Condition: Stable.  CODE STATUS:DNR  Diet recommendation: Heart Healthy  Brief/Interim Summary: 59 year old lady with prior history of coronary artery disease s/p CABG, diabetes mellitus, hypertension, hyperlipidemia presents to ED for generalized weakness, fevers and chills patient reports that she lives at section 8 housing in [place], apparently lost her housing and moved to [place] she was currently living in a motel prior to admission.  On arrival to ED she was found to have a left great toe wound progressively getting worse with increased pain and foul-smelling drainage.  Imaging of the toe showed osteomyelitis.  Orthopedic surgery comes consulted underwent toe amputation on 11/17/2019.   Wound cultures from the wound show MSSA. She is on oral antibiotics and is stable to be discharged. TOC on board and she had a bed at [facility], but patient refused to go to SNF in [place] and wanted to stay in the hospital until she gets funds from her relatives so she can discharged on Monday or Tuesday.     Discharge Diagnoses:  Active Problems:   Osteomyelitis ([place])   Malnutrition of moderate degree  Osteomyelitis of the great toe S/p amputation by orthopedic surgery on 11/17/2019.  Intraoperative cultures showing MSSA. Blood cultures have been negative.  Complete the course of oral antibiotics.    Type 2 diabetes mellitus  CBG (last 3)  Recent Labs    11/22/19 2118 11/23/19 0721 11/23/19 1238  GLUCAP 184* 206* 255*   Uncontrolled with  hyperglycemia Increase her lantus to her home dose along with NOVOLOG TIDAC.  On discharge.    Pseudohyponatremia probably secondary to elevated CBGs.    Hyperlipidemia Continue with the Lipitor  Hypertension Optimal BP parameters.  Continue with metoprolol   Presumed chronic diastolic heart failure Patient appears to be euvolemic.     Stage IIIa CKD Creatinine appears to be at baseline.    Moderate malnutrition Nutrition consulted.  Hypomagnesemia Replaced    Discharge Instructions  Discharge Instructions    Diet - low sodium heart healthy   Complete by: As directed    Discharge wound care:   Complete by: As directed    Dressing changes as per orthopedics.     Allergies as of 11/23/2019      Reactions   Avandia [rosiglitazone] Other (See Comments)   unk   Niacin And Related Other (See Comments)   unk      Medication List    STOP taking these medications   hydrOXYzine 25 MG capsule Commonly known as: VISTARIL   lisinopril 10 MG tablet Commonly known as: ZESTRIL     TAKE these medications   aspirin EC 81 MG tablet Take 1 tablet (81 mg total) by mouth daily.   atorvastatin 40 MG tablet Commonly known as: LIPITOR Take 1 tablet (40 mg total) by mouth daily.   buPROPion 100 MG 12 hr tablet Commonly known as: WELLBUTRIN SR Take 100 mg by mouth daily.   CENTRUM SILVER PO Take by mouth.   cephALEXin 500 MG capsule Commonly known as: KEFLEX Take 1 capsule (500 mg total) by mouth 2 (two) times daily for 7 days.  citalopram 20 MG tablet Commonly known as: CELEXA Take 1 tablet (20 mg total) by mouth daily.   clopidogrel 75 MG tablet Commonly known as: PLAVIX Take 1 tablet (75 mg total) by mouth daily.   feeding supplement (GLUCERNA SHAKE) Liqd Take 237 mLs by mouth 3 (three) times daily between meals.   fenofibrate 145 MG tablet Commonly known as: TRICOR Take 1 tablet (145 mg total) by mouth daily.   gabapentin 800  MG tablet Commonly known as: NEURONTIN Take 1 tablet (800 mg total) by mouth 3 (three) times daily.   hydrocortisone 2.5 % rectal cream Commonly known as: ANUSOL-HC Place rectally 4 (four) times daily.   insulin aspart 100 UNIT/ML injection Commonly known as: novoLOG Inject 3 Units into the skin 3 (three) times daily with meals. What changed: how much to take   insulin aspart 100 UNIT/ML injection Commonly known as: novoLOG CBG 70 - 120: 0 units CBG 121 - 150: 2 units CBG 151 - 200: 3 units CBG 201 - 250: 5 units CBG 251 - 300: 8 units CBG 301 - 350: 11 units CBG 351 - 400: 15 units What changed: You were already taking a medication with the same name, and this prescription was added. Make sure you understand how and when to take each.   insulin glargine 100 UNIT/ML injection Commonly known as: LANTUS Inject 0.6 mLs (60 Units total) into the skin at bedtime.   lamoTRIgine 100 MG tablet Commonly known as: LAMICTAL Take 100 mg by mouth 2 (two) times daily.   metFORMIN 500 MG tablet Commonly known as: GLUCOPHAGE Take 1 tablet (500 mg total) by mouth 2 (two) times daily with a meal.   metoprolol tartrate 50 MG tablet Commonly known as: LOPRESSOR Take 50 mg by mouth daily.   sertraline 100 MG tablet Commonly known as: ZOLOFT Take 100 mg by mouth in the morning and at bedtime.   vitamin B-12 1000 MCG tablet Commonly known as: CYANOCOBALAMIN Take 1,000 mcg by mouth daily.            Discharge Care Instructions  (From admission, onward)         Start     Ordered   11/23/19 0000  Discharge wound care:       Comments: Dressing changes as per orthopedics.   11/23/19 1030          Follow-up Information    Erle Crocker, MD. Schedule an appointment as soon as possible for a visit in 1 week.   Specialty: Orthopedic Surgery Contact information: [place] [address] [phone]              Allergies  Allergen Reactions  .  Avandia [Rosiglitazone] Other (See Comments)    unk  . Niacin And Related Other (See Comments)    unk    Consultations:  Orthopedics.    Procedures/Studies: MR FOOT LEFT WO CONTRAST  Result Date: 11/16/2019 CLINICAL DATA:  Diabetic patient with pain and swelling of the left great toe. Wound on the dorsal aspect of the great toe. EXAM: MRI OF THE LEFT FOOT WITHOUT CONTRAST TECHNIQUE: Multiplanar, multisequence MR imaging of the left foot was performed. No intravenous contrast was administered. COMPARISON:  Plain films left foot 11/15/2019. FINDINGS: Bones/Joint/Cartilage There is osteolysis of the majority of the distal phalanx of the great toe. Remnant of the distal phalanx demonstrates edema and decreased T1 signal consistent with osteomyelitis. Patchy, mild marrow edema is also seen in the head and neck  of the proximal phalanx of the great toe. Bone marrow signal is otherwise unremarkable. Ligaments Appear intact. Muscles and Tendons No intramuscular fluid collection. Intermediate increased T2 signal in all intrinsic musculature is most consistent with diabetic myopathy. There is mild intrasubstance increased T2 signal in the distal aspect of the flexor tendon of the great toe. Fluid is seen in the distal 1.5 cm of the sheath of the flexor tendon. Soft tissues Marked soft tissue swelling is present about the great toe. There is a fluid collection in the great toe. Discrete measurement of the collection is not possible. It originates at a skin wound on the dorsal aspect of the toe and extends along the lateral margin of the head and neck of the proximal phalanx. The collection measures up to 2.1 cm transverse by 1.7 cm long in the plantar soft tissues. Small first MTP joint effusion noted. IMPRESSION: Marrow edema throughout the remnant of the distal phalanx of the great toe consistent with osteomyelitis. Milder degree of edema in the head and neck of the proximal phalanx of the great toe is also  likely due to osteomyelitis. Skin wound on the great toe with an underlying fluid collection consistent with abscess as described above. Fluid in approximately the distal 1.5 cm of the sheath of the flexor tendon of the great toe is worrisome for septic tenosynovitis. Electronically Signed   By: Inge Rise M.D.   On: 11/16/2019 12:31   DG Chest Port 1 View  Result Date: 11/15/2019 CLINICAL DATA:  Fever, weakness. EXAM: PORTABLE CHEST 1 VIEW COMPARISON:  None. FINDINGS: The heart size and mediastinal contours are within normal limits. Both lungs are clear. The visualized skeletal structures are unremarkable. IMPRESSION: No active disease. Electronically Signed   By: Marijo Conception M.D.   On: 11/15/2019 16:45   DG Foot Complete Left  Result Date: 11/15/2019 CLINICAL DATA:  59 year old female with diabetes and toe pain. Fever and chills. History of wound to the great toe. EXAM: LEFT FOOT - COMPLETE 3+ VIEW COMPARISON:  None. FINDINGS: There is resorption of the majority of the distal phalanx of the great toe, likely sequela of longstanding osteomyelitis. There is no acute fracture or dislocation. There is diffuse soft tissue swelling of the great toe. No radiopaque foreign object or soft tissue gas. IMPRESSION: 1. No acute fracture or dislocation. 2. Resorption of the majority of the distal phalanx of the great toe. 3. Diffuse soft tissue swelling of the great toe. Electronically Signed   By: Anner Crete M.D.   On: 11/15/2019 16:47       Subjective: No new complaints.   Discharge Exam: Vitals:   11/22/19 2017 11/23/19 0555  BP: (!) 109/53 138/70  Pulse: 70 72  Resp: 14 14  Temp: 97.8 F (36.6 C) 98.3 F (36.8 C)  SpO2: 99% 98%   Vitals:   11/22/19 0456 11/22/19 1000 11/22/19 2017 11/23/19 0555  BP: 100/63 118/69 (!) 109/53 138/70  Pulse: 71 66 70 72  Resp: 16 16 14 14   Temp: 98 F (36.7 C)  97.8 F (36.6 C) 98.3 F (36.8 C)  TempSrc: Oral Oral Oral Oral  SpO2: 100%  99% 99% 98%  Weight:      Height:        General: Pt is alert, awake, not in acute distress Cardiovascular: RRR, S1/S2 +, no rubs, no gallops Respiratory: CTA bilaterally, no wheezing, no rhonchi Abdominal: Soft, NT, ND, bowel sounds + Extremities: no edema, no cyanosis  The results of significant diagnostics from this hospitalization (including imaging, microbiology, ancillary and laboratory) are listed below for reference.     Microbiology: Recent Results (from the past 240 hour(s))  Respiratory Panel by RT PCR (Flu A&B, Covid) - Nasopharyngeal Swab     Status: None   Collection Time: 11/15/19  4:33 PM   Specimen: Nasopharyngeal Swab  Result Value Ref Range Status   SARS Coronavirus 2 by RT PCR NEGATIVE NEGATIVE Final    Comment: (NOTE) SARS-CoV-2 target nucleic acids are NOT DETECTED.  The SARS-CoV-2 RNA is generally detectable in upper respiratoy specimens during the acute phase of infection. The lowest concentration of SARS-CoV-2 viral copies this assay can detect is 131 copies/mL. A negative result does not preclude SARS-Cov-2 infection and should not be used as the sole basis for treatment or other patient management decisions. A negative result may occur with  improper specimen collection/handling, submission of specimen other than nasopharyngeal swab, presence of viral mutation(s) within the areas targeted by this assay, and inadequate number of viral copies (<131 copies/mL). A negative result must be combined with clinical observations, patient history, and epidemiological information. The expected result is Negative.  Fact Sheet for Patients:  PinkCheek.be  Fact Sheet for Healthcare Providers:  GravelBags.it  This test is no t yet approved or cleared by the [place] FDA and  has been authorized for detection and/or diagnosis of SARS-CoV-2 by FDA under an Emergency Use Authorization (EUA). This  EUA will remain  in effect (meaning this test can be used) for the duration of the COVID-19 declaration under Section 564(b)(1) of the Act, 21 U.S.C. section 360bbb-3(b)(1), unless the authorization is terminated or revoked sooner.     Influenza A by PCR NEGATIVE NEGATIVE Final   Influenza B by PCR NEGATIVE NEGATIVE Final    Comment: (NOTE) The Xpert Xpress SARS-CoV-2/FLU/RSV assay is intended as an aid in  the diagnosis of influenza from Nasopharyngeal swab specimens and  should not be used as a sole basis for treatment. Nasal washings and  aspirates are unacceptable for Xpert Xpress SARS-CoV-2/FLU/RSV  testing.  Fact Sheet for Patients: PinkCheek.be  Fact Sheet for Healthcare Providers: GravelBags.it  This test is not yet approved or cleared by the [place] FDA and  has been authorized for detection and/or diagnosis of SARS-CoV-2 by  FDA under an Emergency Use Authorization (EUA). This EUA will remain  in effect (meaning this test can be used) for the duration of the  Covid-19 declaration under Section 564(b)(1) of the Act, 21  U.S.C. section 360bbb-3(b)(1), unless the authorization is  terminated or revoked. Performed at [facility], [place] [address], [place], [place] [address]   Culture, blood (routine x 2)     Status: None   Collection Time: 11/15/19  4:45 PM   Specimen: BLOOD  Result Value Ref Range Status   Specimen Description   Final    BLOOD SITE NOT SPECIFIED Performed at [facility], [address] [place], [place] [address]    Special Requests   Final    BOTTLES DRAWN AEROBIC AND ANAEROBIC Blood Culture adequate volume Performed at [place] [address] [place], [place] [address]    Culture   Final    NO GROWTH 5 DAYS Performed at [facility], [place] [address] [place],  [address]    Report Status 11/20/2019 FINAL  Final  Wound or  Superficial Culture     Status: None  Collection Time: 11/15/19  4:56 PM   Specimen: Toe; Wound  Result Value Ref Range Status   Specimen Description   Final    TOE LEFT Performed at [place] [address] [place], [place] [address]    Special Requests   Final    NONE Performed at [facility], [place] [address] [place], [place] [address]    Gram Stain   Final    RARE WBC PRESENT, PREDOMINANTLY PMN FEW GRAM POSITIVE COCCI Performed at [facility], [place] [address], [place], [place] [address]    Culture ABUNDANT STAPHYLOCOCCUS AUREUS  Final   Report Status 11/18/2019 FINAL  Final   Organism ID, Bacteria STAPHYLOCOCCUS AUREUS  Final      Susceptibility   Staphylococcus aureus - MIC*    CIPROFLOXACIN <=0.5 SENSITIVE Sensitive     ERYTHROMYCIN <=0.25 SENSITIVE Sensitive     GENTAMICIN <=0.5 SENSITIVE Sensitive     OXACILLIN <=0.25 SENSITIVE Sensitive     TETRACYCLINE <=1 SENSITIVE Sensitive     VANCOMYCIN <=0.5 SENSITIVE Sensitive     TRIMETH/SULFA <=10 SENSITIVE Sensitive     CLINDAMYCIN >=8 RESISTANT Resistant     RIFAMPIN <=0.5 SENSITIVE Sensitive     Inducible Clindamycin NEGATIVE Sensitive     * ABUNDANT STAPHYLOCOCCUS AUREUS  Aerobic/Anaerobic Culture (surgical/deep wound)     Status: None   Collection Time: 11/17/19  3:39 PM   Specimen: Soft Tissue, Other  Result Value Ref Range Status   Specimen Description   Final    TOE LEFT GREAT TOE Performed at [place] [address], [place], [place] [address]    Special Requests   Final    NONE Performed at [facility], [place] [address] [place], [place] [address]    Gram Stain   Final    FEW WBC PRESENT, PREDOMINANTLY PMN RARE GRAM POSITIVE COCCI    Culture   Final    FEW STAPHYLOCOCCUS AUREUS NO ANAEROBES ISOLATED Performed at [facility], [place] [address], [place],  [address]    Report Status 11/22/2019 FINAL   Final   Organism ID, Bacteria STAPHYLOCOCCUS AUREUS  Final      Susceptibility   Staphylococcus aureus - MIC*    CIPROFLOXACIN <=0.5 SENSITIVE Sensitive     ERYTHROMYCIN <=0.25 SENSITIVE Sensitive     GENTAMICIN <=0.5 SENSITIVE Sensitive     OXACILLIN <=0.25 SENSITIVE Sensitive     TETRACYCLINE <=1 SENSITIVE Sensitive     VANCOMYCIN <=0.5 SENSITIVE Sensitive     TRIMETH/SULFA <=10 SENSITIVE Sensitive     CLINDAMYCIN >=8 RESISTANT Resistant     RIFAMPIN <=0.5 SENSITIVE Sensitive     Inducible Clindamycin NEGATIVE Sensitive     * FEW STAPHYLOCOCCUS AUREUS  Respiratory Panel by RT PCR (Flu A&B, Covid) - Nasopharyngeal Swab     Status: None   Collection Time: 11/22/19  6:05 PM   Specimen: Nasopharyngeal Swab  Result Value Ref Range Status   SARS Coronavirus 2 by RT PCR NEGATIVE NEGATIVE Final    Comment: (NOTE) SARS-CoV-2 target nucleic acids are NOT DETECTED.  The SARS-CoV-2 RNA is generally detectable in upper respiratoy specimens during the acute phase of infection. The lowest concentration of SARS-CoV-2 viral copies this assay can detect is 131 copies/mL. A negative result does not preclude SARS-Cov-2 infection and should not be used as the sole basis for treatment or other patient management decisions. A negative result may occur with  improper specimen collection/handling,  submission of specimen other than nasopharyngeal swab, presence of viral mutation(s) within the areas targeted by this assay, and inadequate number of viral copies (<131 copies/mL). A negative result must be combined with clinical observations, patient history, and epidemiological information. The expected result is Negative.  Fact Sheet for Patients:  PinkCheek.be  Fact Sheet for Healthcare Providers:  GravelBags.it  This test is no t yet approved or cleared by the [place] FDA and  has been authorized for detection and/or diagnosis of  SARS-CoV-2 by FDA under an Emergency Use Authorization (EUA). This EUA will remain  in effect (meaning this test can be used) for the duration of the COVID-19 declaration under Section 564(b)(1) of the Act, 21 U.S.C. section 360bbb-3(b)(1), unless the authorization is terminated or revoked sooner.     Influenza A by PCR NEGATIVE NEGATIVE Final   Influenza B by PCR NEGATIVE NEGATIVE Final    Comment: (NOTE) The Xpert Xpress SARS-CoV-2/FLU/RSV assay is intended as an aid in  the diagnosis of influenza from Nasopharyngeal swab specimens and  should not be used as a sole basis for treatment. Nasal washings and  aspirates are unacceptable for Xpert Xpress SARS-CoV-2/FLU/RSV  testing.  Fact Sheet for Patients: PinkCheek.be  Fact Sheet for Healthcare Providers: GravelBags.it  This test is not yet approved or cleared by the [place] FDA and  has been authorized for detection and/or diagnosis of SARS-CoV-2 by  FDA under an Emergency Use Authorization (EUA). This EUA will remain  in effect (meaning this test can be used) for the duration of the  Covid-19 declaration under Section 564(b)(1) of the Act, 21  U.S.C. section 360bbb-3(b)(1), unless the authorization is  terminated or revoked. Performed at [facility], [place] [address] [place], [place] [address]      Labs: BNP (last 3 results) No results for input(s): BNP in the last 8760 hours. Basic Metabolic Panel: Recent Labs  Lab 11/16/19 1311 11/17/19 0337 11/18/19 0401 11/19/19 0333 11/21/19 0331 11/22/19 0343  NA  --  130*  --  133* 135 132*  K  --  4.5  --  5.1 4.6 4.7  CL  --  96*  --  96* 98 97*  CO2  --  26  --  29 29 26   GLUCOSE 512* 339*  --  223* 172* 212*  BUN  --  25*  --  25* 22 21  CREATININE  --  1.08* 1.02* 1.10* 1.02* 1.06*  CALCIUM  --  8.3*  --  8.6* 9.2 9.1  MG  --   --   --   --  1.6* 1.8   Liver Function Tests: Recent  Labs  Lab 11/19/19 0333 11/21/19 0331 11/22/19 0343  AST 14* 16 20  ALT 11 9 7   ALKPHOS 78 59 61  BILITOT 0.3 0.4 0.4  PROT 5.9* 6.3* 6.5  ALBUMIN 2.3* 2.4* 2.9*   No results for input(s): LIPASE, AMYLASE in the last 168 hours. No results for input(s): AMMONIA in the last 168 hours. CBC: Recent Labs  Lab 11/17/19 0337 11/19/19 0333 11/21/19 0331 11/22/19 0343  WBC 13.7* 11.0* 10.7* 11.6*  HGB 10.4* 9.5* 9.3* 9.8*  HCT 31.1* 29.0* 29.2* 31.0*  MCV 94.2 95.7 96.7 100.0  PLT 332 357 362 405*   Cardiac Enzymes: No results for input(s): CKTOTAL, CKMB, CKMBINDEX, TROPONINI in the last 168 hours. BNP: Invalid input(s): POCBNP CBG: Recent Labs  Lab 11/22/19 1200 11/22/19 1631 11/22/19 2118 11/23/19 0721 11/23/19 [place]  209* 230* 184* 206* 255*   D-Dimer No results for input(s): DDIMER in the last 72 hours. Hgb A1c No results for input(s): HGBA1C in the last 72 hours. Lipid Profile No results for input(s): CHOL, HDL, LDLCALC, TRIG, CHOLHDL, LDLDIRECT in the last 72 hours. Thyroid function studies No results for input(s): TSH, T4TOTAL, T3FREE, THYROIDAB in the last 72 hours.  Invalid input(s): FREET3 Anemia work up No results for input(s): VITAMINB12, FOLATE, FERRITIN, TIBC, IRON, RETICCTPCT in the last 72 hours. Urinalysis    Component Value Date/Time   COLORURINE YELLOW 11/16/2019 1345   APPEARANCEUR CLEAR 11/16/2019 1345   LABSPEC 1.032 (H) 11/16/2019 1345   PHURINE 5.0 11/16/2019 1345   GLUCOSEU >=500 (A) 11/16/2019 1345   HGBUR NEGATIVE 11/16/2019 1345   BILIRUBINUR NEGATIVE 11/16/2019 1345   KETONESUR NEGATIVE 11/16/2019 1345   PROTEINUR NEGATIVE 11/16/2019 1345   NITRITE NEGATIVE 11/16/2019 1345   LEUKOCYTESUR NEGATIVE 11/16/2019 1345   Sepsis Labs Invalid input(s): PROCALCITONIN,  WBC,  LACTICIDVEN Microbiology Recent Results (from the past 240 hour(s))  Respiratory Panel by RT PCR (Flu A&B, Covid) - Nasopharyngeal Swab     Status: None    Collection Time: 11/15/19  4:33 PM   Specimen: Nasopharyngeal Swab  Result Value Ref Range Status   SARS Coronavirus 2 by RT PCR NEGATIVE NEGATIVE Final    Comment: (NOTE) SARS-CoV-2 target nucleic acids are NOT DETECTED.  The SARS-CoV-2 RNA is generally detectable in upper respiratoy specimens during the acute phase of infection. The lowest concentration of SARS-CoV-2 viral copies this assay can detect is 131 copies/mL. A negative result does not preclude SARS-Cov-2 infection and should not be used as the sole basis for treatment or other patient management decisions. A negative result may occur with  improper specimen collection/handling, submission of specimen other than nasopharyngeal swab, presence of viral mutation(s) within the areas targeted by this assay, and inadequate number of viral copies (<131 copies/mL). A negative result must be combined with clinical observations, patient history, and epidemiological information. The expected result is Negative.  Fact Sheet for Patients:  PinkCheek.be  Fact Sheet for Healthcare Providers:  GravelBags.it  This test is no t yet approved or cleared by the [place] FDA and  has been authorized for detection and/or diagnosis of SARS-CoV-2 by FDA under an Emergency Use Authorization (EUA). This EUA will remain  in effect (meaning this test can be used) for the duration of the COVID-19 declaration under Section 564(b)(1) of the Act, 21 U.S.C. section 360bbb-3(b)(1), unless the authorization is terminated or revoked sooner.     Influenza A by PCR NEGATIVE NEGATIVE Final   Influenza B by PCR NEGATIVE NEGATIVE Final    Comment: (NOTE) The Xpert Xpress SARS-CoV-2/FLU/RSV assay is intended as an aid in  the diagnosis of influenza from Nasopharyngeal swab specimens and  should not be used as a sole basis for treatment. Nasal washings and  aspirates are unacceptable for Xpert  Xpress SARS-CoV-2/FLU/RSV  testing.  Fact Sheet for Patients: PinkCheek.be  Fact Sheet for Healthcare Providers: GravelBags.it  This test is not yet approved or cleared by the [place] FDA and  has been authorized for detection and/or diagnosis of SARS-CoV-2 by  FDA under an Emergency Use Authorization (EUA). This EUA will remain  in effect (meaning this test can be used) for the duration of the  Covid-19 declaration under Section 564(b)(1) of the Act, 21  U.S.C. section 360bbb-3(b)(1), unless the authorization is  terminated or revoked. Performed at [facility], [place] [address] [place], [place] [address]   Culture, blood (routine x 2)     Status: None   Collection Time: 11/15/19  4:45 PM   Specimen: BLOOD  Result Value Ref Range Status   Specimen Description   Final    BLOOD SITE NOT SPECIFIED Performed at [facility], [address] [place], [place] [address]    Special Requests   Final    BOTTLES DRAWN AEROBIC AND ANAEROBIC Blood Culture adequate volume Performed at [place] [address], [place], [place] [address]    Culture   Final    NO GROWTH 5 DAYS Performed at [facility], [place] [address] [place], [place] [address]    Report Status 11/20/2019 FINAL  Final  Wound or Superficial Culture     Status: None   Collection Time: 11/15/19  4:56 PM   Specimen: Toe; Wound  Result Value Ref Range Status   Specimen Description   Final    TOE LEFT Performed at [facility] [address] [place] [place], [place] [address]    Special Requests   Final    NONE Performed at [facility], [place] [address] [place], [place] [address]    Gram Stain   Final    RARE WBC PRESENT, PREDOMINANTLY PMN FEW GRAM POSITIVE COCCI Performed at [facility], [place] [address], [place], [place] [address]    Culture ABUNDANT STAPHYLOCOCCUS  AUREUS  Final   Report Status 11/18/2019 FINAL  Final   Organism ID, Bacteria STAPHYLOCOCCUS AUREUS  Final      Susceptibility   Staphylococcus aureus - MIC*    CIPROFLOXACIN <=0.5 SENSITIVE Sensitive     ERYTHROMYCIN <=0.25 SENSITIVE Sensitive     GENTAMICIN <=0.5 SENSITIVE Sensitive     OXACILLIN <=0.25 SENSITIVE Sensitive     TETRACYCLINE <=1 SENSITIVE Sensitive     VANCOMYCIN <=0.5 SENSITIVE Sensitive     TRIMETH/SULFA <=10 SENSITIVE Sensitive     CLINDAMYCIN >=8 RESISTANT Resistant     RIFAMPIN <=0.5 SENSITIVE Sensitive     Inducible Clindamycin NEGATIVE Sensitive     * ABUNDANT STAPHYLOCOCCUS AUREUS  Aerobic/Anaerobic Culture (surgical/deep wound)     Status: None   Collection Time: 11/17/19  3:39 PM   Specimen: Soft Tissue, Other  Result Value Ref Range Status   Specimen Description   Final    TOE LEFT GREAT TOE Performed at [facility] [address] [place], [place] [address]    Special Requests   Final    NONE Performed at [facility], [place] [address] [place],  [address]    Gram Stain   Final    FEW WBC PRESENT, PREDOMINANTLY PMN RARE GRAM POSITIVE COCCI    Culture   Final    FEW STAPHYLOCOCCUS AUREUS NO ANAEROBES ISOLATED Performed at [facility], [place] [address], [place],  [address]    Report Status 11/22/2019 FINAL  Final   Organism ID, Bacteria STAPHYLOCOCCUS AUREUS  Final      Susceptibility   Staphylococcus aureus - MIC*    CIPROFLOXACIN <=0.5 SENSITIVE Sensitive     ERYTHROMYCIN <=0.25 SENSITIVE Sensitive     GENTAMICIN <=0.5 SENSITIVE Sensitive     OXACILLIN <=0.25 SENSITIVE Sensitive     TETRACYCLINE <=1 SENSITIVE Sensitive     VANCOMYCIN <=0.5 SENSITIVE Sensitive     TRIMETH/SULFA <=10 SENSITIVE Sensitive     CLINDAMYCIN >=8 RESISTANT Resistant  RIFAMPIN <=0.5 SENSITIVE Sensitive     Inducible Clindamycin NEGATIVE Sensitive     * FEW STAPHYLOCOCCUS AUREUS  Respiratory Panel by RT  PCR (Flu A&B, Covid) - Nasopharyngeal Swab     Status: None   Collection Time: 11/22/19  6:05 PM   Specimen: Nasopharyngeal Swab  Result Value Ref Range Status   SARS Coronavirus 2 by RT PCR NEGATIVE NEGATIVE Final    Comment: (NOTE) SARS-CoV-2 target nucleic acids are NOT DETECTED.  The SARS-CoV-2 RNA is generally detectable in upper respiratoy specimens during the acute phase of infection. The lowest concentration of SARS-CoV-2 viral copies this assay can detect is 131 copies/mL. A negative result does not preclude SARS-Cov-2 infection and should not be used as the sole basis for treatment or other patient management decisions. A negative result may occur with  improper specimen collection/handling, submission of specimen other than nasopharyngeal swab, presence of viral mutation(s) within the areas targeted by this assay, and inadequate number of viral copies (<131 copies/mL). A negative result must be combined with clinical observations, patient history, and epidemiological information. The expected result is Negative.  Fact Sheet for Patients:  PinkCheek.be  Fact Sheet for Healthcare Providers:  GravelBags.it  This test is no t yet approved or cleared by the [place] FDA and  has been authorized for detection and/or diagnosis of SARS-CoV-2 by FDA under an Emergency Use Authorization (EUA). This EUA will remain  in effect (meaning this test can be used) for the duration of the COVID-19 declaration under Section 564(b)(1) of the Act, 21 U.S.C. section 360bbb-3(b)(1), unless the authorization is terminated or revoked sooner.     Influenza A by PCR NEGATIVE NEGATIVE Final   Influenza B by PCR NEGATIVE NEGATIVE Final    Comment: (NOTE) The Xpert Xpress SARS-CoV-2/FLU/RSV assay is intended as an aid in  the diagnosis of influenza from Nasopharyngeal swab specimens and  should not be used as a sole basis for  treatment. Nasal washings and  aspirates are unacceptable for Xpert Xpress SARS-CoV-2/FLU/RSV  testing.  Fact Sheet for Patients: PinkCheek.be  Fact Sheet for Healthcare Providers: GravelBags.it  This test is not yet approved or cleared by the [place] FDA and  has been authorized for detection and/or diagnosis of SARS-CoV-2 by  FDA under an Emergency Use Authorization (EUA). This EUA will remain  in effect (meaning this test can be used) for the duration of the  Covid-19 declaration under Section 564(b)(1) of the Act, 21  U.S.C. section 360bbb-3(b)(1), unless the authorization is  terminated or revoked. Performed at [facility], [place] [address], [place], [place] [address]      Time coordinating discharge: 32 minutes. SIGNED:   Hosie Poisson, MD  [facility] 11/23/2019, 1:05 PM

## 2019-11-23 NOTE — TOC Transition Note (Signed)
Transition of Care [facility]) - CM/SW Discharge Note   Patient Details  Name: [name] MRN: [id number] Date of Birth: [birth year]  Transition of Care [facility]) CM/SW Contact:  Lia Hopping, [place] Phone Number: 11/23/2019, 3:59 PM   Clinical Narrative:    [facility] in Rehab extended a bed offer. CSW notified the patient. Patient declined the bed offer. Patient has no other bed offers. CSW notified the physician.    TOC staff signing off.     Final next level of care: Skilled Nursing Facility Barriers to Discharge: Barriers Resolved   Patient Goals and CMS Choice   CMS Medicare.gov Compare Post Acute Care list provided to:: Patient Choice offered to / list presented to : Patient  Discharge Placement                       Discharge Plan and Services In-house Referral: Clinical Social Work Discharge Planning Services: CM Consult Post Acute Care Choice: Skilled Nursing Facility                               Social Determinants of Health (SDOH) Interventions     Readmission Risk Interventions No flowsheet data found.

## 2019-11-24 DIAGNOSIS — E440 Moderate protein-calorie malnutrition: Secondary | ICD-10-CM | POA: Diagnosis not present

## 2019-11-24 LAB — GLUCOSE, CAPILLARY
Glucose-Capillary: 161 mg/dL — ABNORMAL HIGH (ref 70–99)
Glucose-Capillary: 253 mg/dL — ABNORMAL HIGH (ref 70–99)
Glucose-Capillary: 147 mg/dL — ABNORMAL HIGH (ref 70–99)
Glucose-Capillary: 122 mg/dL — ABNORMAL HIGH (ref 70–99)

## 2019-11-24 MED ORDER — INSULIN GLARGINE 100 UNIT/ML ~~LOC~~ SOLN
50.0000 [IU] | Freq: Every day | SUBCUTANEOUS | Status: DC
  Administered 2019-11-24: 50 [IU] via SUBCUTANEOUS
  Filled 2019-11-24 (×2): qty 0.5

## 2019-11-24 NOTE — Progress Notes (Signed)
Physical Therapy Treatment Patient Details Name: Pamela Knight MRN: [id number] DOB: 02-19-1960 Today's Date: 11/24/2019    History of Present Illness Pt admitted with hyperglycemia, weakness, and L great toe osteomyelitis.  Pt with hx of MI, CHF, DM, CAD, CABG, and neuropathy    PT Comments    Pt received awake in bed. Supervision with getting EOB. Min A to sit<>stand for safety; pt physically able to sit<>stand mod I, however, attmpts sit>stand in an unsafe manner, by placing RW at full arms length from her body before standing. Corrected immediately and instructed on safe and proper hand placement for standing. Pt reports that she doesn't have any trouble standing up, and that she won't fall. Min G with gait in hallway with RW for 237ft. She requires cues to remain focused on the task; has tendencies to turn and look all around, causing her steppage and RW driving to be slightly unsteady and uneven, no LOB. Very friendly to other patients/staff in hallway, she likes to wave and speak to everyone. Looking around for her nurse in the hallway. Min G to stand pivot before sitting on bed. Supervision with dynamic standing balance to take of top gown. Supervision to get supine in bed. Requests therapist to get a pillow for her L foot and blankets.  Follow Up Recommendations  SNF;Home health PT     Equipment Recommendations  Rolling walker with 5" wheels    Recommendations for Other Services       Precautions / Restrictions Precautions Precautions: Fall Precaution Comments: Pt reports multiple previous falls Required Braces or Orthoses: Other Brace Other Brace: post Op shoe Restrictions Weight Bearing Restrictions: No LLE Weight Bearing: Weight bearing as tolerated    Mobility  Bed Mobility Overal bed mobility: Modified Independent;Needs Assistance Bed Mobility: Supine to Sit     Supine to sit: Supervision        Transfers Overall transfer level: Needs assistance Equipment  used: Rolling walker (2 wheeled) Transfers: Sit to/from [facility] Sit to Stand: Min assist Stand pivot transfers: Min guard       General transfer comment: min G/min A for safety. Pt required tactile and verbal cues for safe hand placement.  Ambulation/Gait Ambulation/Gait assistance: Min guard Gait Distance (Feet): 230 Feet Assistive device: Rolling walker (2 wheeled) Gait Pattern/deviations: Step-through pattern;Trunk flexed;Decreased stride length     General Gait Details: cues to avoid getting distracted by surroundings and focus on task; very interested in others and speaking to everyone. instructed on importance of focus and safety   Stairs             Wheelchair Mobility    Modified Rankin (Stroke Patients Only)       Balance                                            Cognition Arousal/Alertness: Awake/alert Behavior During Therapy: WFL for tasks assessed/performed Overall Cognitive Status: Within Functional Limits for tasks assessed                                        Exercises      General Comments        Pertinent Vitals/Pain Pain Assessment: Faces Faces Pain Scale: Hurts a little bit Pain Location: L heel begins to  hurt after ambulating 282ft, per pt statement Pain Descriptors / Indicators: Sore Pain Intervention(s): Monitored during session    Home Living                      Prior Function            PT Goals (current goals can now be found in the care plan section) Acute Rehab PT Goals Patient Stated Goal: Regain IND PT Goal Formulation: With patient Time For Goal Achievement: 11/30/19 Potential to Achieve Goals: Good Progress towards PT goals: Progressing toward goals    Frequency    Min 3X/week      PT Plan Current plan remains appropriate    Co-evaluation              AM-PAC PT "6 Clicks" Mobility   Outcome Measure  Help needed turning from  your back to your side while in a flat bed without using bedrails?: None Help needed moving from lying on your back to sitting on the side of a flat bed without using bedrails?: None Help needed moving to and from a bed to a chair (including a wheelchair)?: None Help needed standing up from a chair using your arms (e.g., wheelchair or bedside chair)?: None Help needed to walk in hospital room?: A Little Help needed climbing 3-5 steps with a railing? : A Little 6 Click Score: 22    End of Session Equipment Utilized During Treatment: Gait belt Activity Tolerance: Patient tolerated treatment well Patient left: with call bell/phone within reach;in bed;with bed alarm set Nurse Communication: Mobility status PT Visit Diagnosis: Muscle weakness (generalized) (M62.81);Difficulty in walking, not elsewhere classified (R26.2);Unsteadiness on feet (R26.81)     Time: 0109-3235 PT Time Calculation (min) (ACUTE ONLY): 13 min  Charges:  $Gait Training: 8-22 mins                     C. Parks Neptune, [facility] [phone]

## 2019-11-24 NOTE — Discharge Summary (Addendum)
Physician Discharge Summary  Pamela Knight JJH:[id number] DOB: Apr 03, 1960 DOA: 11/15/2019  PCP: Patient, No Pcp Per  Admit date: 11/15/2019 Discharge date: 11/24/2019  Admitted From: Home.  Disposition: Home.   Recommendations for Outpatient Follow-up:  1. Follow up with PCP in 1-2 weeks 2. Please obtain BMP/CBC in one week Please follow up with orthopedics as needed/recommended.   Discharge Condition: Stable.  CODE STATUS:DNR  Diet recommendation: Heart Healthy  Brief/Interim Summary: 59 year old lady with prior history of coronary artery disease s/p CABG, diabetes mellitus, hypertension, hyperlipidemia presents to ED for generalized weakness, fevers and chills patient reports that she lives at section 8 housing in [place], apparently lost her housing and moved to [place] she was currently living in a motel prior to admission.  On arrival to ED she was found to have a left great toe wound progressively getting worse with increased pain and foul-smelling drainage.  Imaging of the toe showed osteomyelitis.  Orthopedic surgery comes consulted underwent toe amputation on 11/17/2019.   Wound cultures from the wound show MSSA. She is on oral antibiotics and is stable to be discharged. TOC on board and she had a bed at [facility], but patient refused to go to SNF in [place] and wanted to stay in the hospital until she gets funds from her relatives so she can discharged on Monday or Tuesday.     Discharge Diagnoses:  Active Problems:   Uncontrolled type 2 diabetes mellitus with stage 2 chronic kidney disease, with long-term current use of insulin (HCC)   Essential hypertension, benign   Hypercholesteremia   DM type 2 causing vascular disease ([place])   Personal history of noncompliance with medical treatment, presenting hazards to health   Osteomyelitis ([place])   Malnutrition of moderate degree  Osteomyelitis of the great toe S/p amputation by orthopedic surgery  on 11/17/2019.  Intraoperative cultures showing MSSA. Blood cultures have been negative.  Complete the course of oral antibiotics.    Type 2 diabetes mellitus  CBG (last 3)  Recent Labs    11/24/19 0713 11/24/19 1157 11/24/19 1733  GLUCAP 161* 253* 147*   Uncontrolled with hyperglycemia Increase her lantus to her home dose along with NOVOLOG TIDAC.  On discharge.    Pseudohyponatremia probably secondary to elevated CBGs.    Hyperlipidemia Continue with the Lipitor  Hypertension Optimal BP parameters.  Continue with metoprolol   Presumed chronic diastolic heart failure Patient appears to be euvolemic.     Stage IIIa CKD Creatinine appears to be at baseline.    Moderate malnutrition Nutrition consulted.  Hypomagnesemia Replaced    Discharge Instructions  Discharge Instructions    Diet - low sodium heart healthy   Complete by: As directed    Discharge wound care:   Complete by: As directed    Dressing changes as per orthopedics.     Allergies as of 11/24/2019      Reactions   Avandia [rosiglitazone] Other (See Comments)   unk   Niacin And Related Other (See Comments)   unk      Medication List    STOP taking these medications   hydrOXYzine 25 MG capsule Commonly known as: VISTARIL   lisinopril 10 MG tablet Commonly known as: ZESTRIL     TAKE these medications   aspirin EC 81 MG tablet Take 1 tablet (81 mg total) by mouth daily.   atorvastatin 40 MG tablet Commonly known as: LIPITOR Take 1 tablet (40 mg total) by mouth daily.  buPROPion 100 MG 12 hr tablet Commonly known as: WELLBUTRIN SR Take 100 mg by mouth daily.   CENTRUM SILVER PO Take by mouth.   cephALEXin 500 MG capsule Commonly known as: KEFLEX Take 1 capsule (500 mg total) by mouth 2 (two) times daily for 7 days.   citalopram 20 MG tablet Commonly known as: CELEXA Take 1 tablet (20 mg total) by mouth daily.   clopidogrel 75 MG  tablet Commonly known as: PLAVIX Take 1 tablet (75 mg total) by mouth daily.   feeding supplement (GLUCERNA SHAKE) Liqd Take 237 mLs by mouth 3 (three) times daily between meals.   fenofibrate 145 MG tablet Commonly known as: TRICOR Take 1 tablet (145 mg total) by mouth daily.   gabapentin 800 MG tablet Commonly known as: NEURONTIN Take 1 tablet (800 mg total) by mouth 3 (three) times daily.   hydrocortisone 2.5 % rectal cream Commonly known as: ANUSOL-HC Place rectally 4 (four) times daily.   insulin aspart 100 UNIT/ML injection Commonly known as: novoLOG Inject 3 Units into the skin 3 (three) times daily with meals. What changed: how much to take   insulin aspart 100 UNIT/ML injection Commonly known as: novoLOG CBG 70 - 120: 0 units CBG 121 - 150: 2 units CBG 151 - 200: 3 units CBG 201 - 250: 5 units CBG 251 - 300: 8 units CBG 301 - 350: 11 units CBG 351 - 400: 15 units What changed: You were already taking a medication with the same name, and this prescription was added. Make sure you understand how and when to take each.   insulin glargine 100 UNIT/ML injection Commonly known as: LANTUS Inject 0.6 mLs (60 Units total) into the skin at bedtime.   lamoTRIgine 100 MG tablet Commonly known as: LAMICTAL Take 100 mg by mouth 2 (two) times daily.   metFORMIN 500 MG tablet Commonly known as: GLUCOPHAGE Take 1 tablet (500 mg total) by mouth 2 (two) times daily with a meal.   metoprolol tartrate 50 MG tablet Commonly known as: LOPRESSOR Take 50 mg by mouth daily.   sertraline 100 MG tablet Commonly known as: ZOLOFT Take 100 mg by mouth in the morning and at bedtime.   vitamin B-12 1000 MCG tablet Commonly known as: CYANOCOBALAMIN Take 1,000 mcg by mouth daily.            Discharge Care Instructions  (From admission, onward)         Start     Ordered   11/23/19 0000  Discharge wound care:       Comments: Dressing changes as per orthopedics.   11/23/19  1030          Follow-up Information    Erle Crocker, MD. Schedule an appointment as soon as possible for a visit in 1 week.   Specialty: Orthopedic Surgery Contact information: [place] [address] [phone]              Allergies  Allergen Reactions  . Avandia [Rosiglitazone] Other (See Comments)    unk  . Niacin And Related Other (See Comments)    unk    Consultations:  Orthopedics.    Procedures/Studies: MR FOOT LEFT WO CONTRAST  Result Date: 11/16/2019 CLINICAL DATA:  Diabetic patient with pain and swelling of the left great toe. Wound on the dorsal aspect of the great toe. EXAM: MRI OF THE LEFT FOOT WITHOUT CONTRAST TECHNIQUE: Multiplanar, multisequence MR imaging of the left foot was performed. No intravenous  contrast was administered. COMPARISON:  Plain films left foot 11/15/2019. FINDINGS: Bones/Joint/Cartilage There is osteolysis of the majority of the distal phalanx of the great toe. Remnant of the distal phalanx demonstrates edema and decreased T1 signal consistent with osteomyelitis. Patchy, mild marrow edema is also seen in the head and neck of the proximal phalanx of the great toe. Bone marrow signal is otherwise unremarkable. Ligaments Appear intact. Muscles and Tendons No intramuscular fluid collection. Intermediate increased T2 signal in all intrinsic musculature is most consistent with diabetic myopathy. There is mild intrasubstance increased T2 signal in the distal aspect of the flexor tendon of the great toe. Fluid is seen in the distal 1.5 cm of the sheath of the flexor tendon. Soft tissues Marked soft tissue swelling is present about the great toe. There is a fluid collection in the great toe. Discrete measurement of the collection is not possible. It originates at a skin wound on the dorsal aspect of the toe and extends along the lateral margin of the head and neck of the proximal phalanx. The collection measures up to 2.1 cm  transverse by 1.7 cm long in the plantar soft tissues. Small first MTP joint effusion noted. IMPRESSION: Marrow edema throughout the remnant of the distal phalanx of the great toe consistent with osteomyelitis. Milder degree of edema in the head and neck of the proximal phalanx of the great toe is also likely due to osteomyelitis. Skin wound on the great toe with an underlying fluid collection consistent with abscess as described above. Fluid in approximately the distal 1.5 cm of the sheath of the flexor tendon of the great toe is worrisome for septic tenosynovitis. Electronically Signed   By: Inge Rise M.D.   On: 11/16/2019 12:31   DG Chest Port 1 View  Result Date: 11/15/2019 CLINICAL DATA:  Fever, weakness. EXAM: PORTABLE CHEST 1 VIEW COMPARISON:  None. FINDINGS: The heart size and mediastinal contours are within normal limits. Both lungs are clear. The visualized skeletal structures are unremarkable. IMPRESSION: No active disease. Electronically Signed   By: Marijo Conception M.D.   On: 11/15/2019 16:45   DG Foot Complete Left  Result Date: 11/15/2019 CLINICAL DATA:  59 year old female with diabetes and toe pain. Fever and chills. History of wound to the great toe. EXAM: LEFT FOOT - COMPLETE 3+ VIEW COMPARISON:  None. FINDINGS: There is resorption of the majority of the distal phalanx of the great toe, likely sequela of longstanding osteomyelitis. There is no acute fracture or dislocation. There is diffuse soft tissue swelling of the great toe. No radiopaque foreign object or soft tissue gas. IMPRESSION: 1. No acute fracture or dislocation. 2. Resorption of the majority of the distal phalanx of the great toe. 3. Diffuse soft tissue swelling of the great toe. Electronically Signed   By: Anner Crete M.D.   On: 11/15/2019 16:47      Subjective: No new complaints.   Discharge Exam: Vitals:   11/24/19 0900 11/24/19 1358  BP: 93/68 133/75  Pulse: 66 78  Resp:  20  Temp:  98.1 F (36.7  C)  SpO2:  100%   Vitals:   11/23/19 2126 11/24/19 0503 11/24/19 0900 11/24/19 1358  BP: 120/75 100/60 93/68 133/75  Pulse: 66 69 66 78  Resp: 18 16  20   Temp: 98.6 F (37 C) 98.1 F (36.7 C)  98.1 F (36.7 C)  TempSrc:    Oral  SpO2: 100% 97%  100%  Weight:  Height:        General: Pt is alert, awake, not in acute distress Cardiovascular: RRR, S1/S2 +, no rubs, no gallops Respiratory: CTA bilaterally, no wheezing, no rhonchi Abdominal: Soft, NT, ND, bowel sounds + Extremities: no edema, no cyanosis    The results of significant diagnostics from this hospitalization (including imaging, microbiology, ancillary and laboratory) are listed below for reference.     Microbiology: Recent Results (from the past 240 hour(s))  Respiratory Panel by RT PCR (Flu A&B, Covid) - Nasopharyngeal Swab     Status: None   Collection Time: 11/15/19  4:33 PM   Specimen: Nasopharyngeal Swab  Result Value Ref Range Status   SARS Coronavirus 2 by RT PCR NEGATIVE NEGATIVE Final    Comment: (NOTE) SARS-CoV-2 target nucleic acids are NOT DETECTED.  The SARS-CoV-2 RNA is generally detectable in upper respiratoy specimens during the acute phase of infection. The lowest concentration of SARS-CoV-2 viral copies this assay can detect is 131 copies/mL. A negative result does not preclude SARS-Cov-2 infection and should not be used as the sole basis for treatment or other patient management decisions. A negative result may occur with  improper specimen collection/handling, submission of specimen other than nasopharyngeal swab, presence of viral mutation(s) within the areas targeted by this assay, and inadequate number of viral copies (<131 copies/mL). A negative result must be combined with clinical observations, patient history, and epidemiological information. The expected result is Negative.  Fact Sheet for Patients:  PinkCheek.be  Fact Sheet for Healthcare  Providers:  GravelBags.it  This test is no t yet approved or cleared by the [place] FDA and  has been authorized for detection and/or diagnosis of SARS-CoV-2 by FDA under an Emergency Use Authorization (EUA). This EUA will remain  in effect (meaning this test can be used) for the duration of the COVID-19 declaration under Section 564(b)(1) of the Act, 21 U.S.C. section 360bbb-3(b)(1), unless the authorization is terminated or revoked sooner.     Influenza A by PCR NEGATIVE NEGATIVE Final   Influenza B by PCR NEGATIVE NEGATIVE Final    Comment: (NOTE) The Xpert Xpress SARS-CoV-2/FLU/RSV assay is intended as an aid in  the diagnosis of influenza from Nasopharyngeal swab specimens and  should not be used as a sole basis for treatment. Nasal washings and  aspirates are unacceptable for Xpert Xpress SARS-CoV-2/FLU/RSV  testing.  Fact Sheet for Patients: PinkCheek.be  Fact Sheet for Healthcare Providers: GravelBags.it  This test is not yet approved or cleared by the [place] FDA and  has been authorized for detection and/or diagnosis of SARS-CoV-2 by  FDA under an Emergency Use Authorization (EUA). This EUA will remain  in effect (meaning this test can be used) for the duration of the  Covid-19 declaration under Section 564(b)(1) of the Act, 21  U.S.C. section 360bbb-3(b)(1), unless the authorization is  terminated or revoked. Performed at [facility], [place] [address], [place], [place] [address]   Culture, blood (routine x 2)     Status: None   Collection Time: 11/15/19  4:45 PM   Specimen: BLOOD  Result Value Ref Range Status   Specimen Description   Final    BLOOD SITE NOT SPECIFIED Performed at [facility], [address] [place], [place] [address]    Special Requests   Final    BOTTLES DRAWN AEROBIC AND ANAEROBIC Blood Culture adequate  volume Performed at [place] [address], [place], [place] [address]  Culture   Final    NO GROWTH 5 DAYS Performed at [facility], [place] [address] [place], [place] [address]    Report Status 11/20/2019 FINAL  Final  Wound or Superficial Culture     Status: None   Collection Time: 11/15/19  4:56 PM   Specimen: Toe; Wound  Result Value Ref Range Status   Specimen Description   Final    TOE LEFT Performed at [place] [address], [place], [place] [address]    Special Requests   Final    NONE Performed at [facility], [place] [address] [place], [place] [address]    Gram Stain   Final    RARE WBC PRESENT, PREDOMINANTLY PMN FEW GRAM POSITIVE COCCI Performed at [facility], [place] [address] [place], [place] [address]    Culture ABUNDANT STAPHYLOCOCCUS AUREUS  Final   Report Status 11/18/2019 FINAL  Final   Organism ID, Bacteria STAPHYLOCOCCUS AUREUS  Final      Susceptibility   Staphylococcus aureus - MIC*    CIPROFLOXACIN <=0.5 SENSITIVE Sensitive     ERYTHROMYCIN <=0.25 SENSITIVE Sensitive     GENTAMICIN <=0.5 SENSITIVE Sensitive     OXACILLIN <=0.25 SENSITIVE Sensitive     TETRACYCLINE <=1 SENSITIVE Sensitive     VANCOMYCIN <=0.5 SENSITIVE Sensitive     TRIMETH/SULFA <=10 SENSITIVE Sensitive     CLINDAMYCIN >=8 RESISTANT Resistant     RIFAMPIN <=0.5 SENSITIVE Sensitive     Inducible Clindamycin NEGATIVE Sensitive     * ABUNDANT STAPHYLOCOCCUS AUREUS  Aerobic/Anaerobic Culture (surgical/deep wound)     Status: None   Collection Time: 11/17/19  3:39 PM   Specimen: Soft Tissue, Other  Result Value Ref Range Status   Specimen Description   Final    TOE LEFT GREAT TOE Performed at [place] [address] [place], [place] [address]    Special Requests   Final    NONE Performed at [facility], [place] [address], [place], [place] [address]    Gram  Stain   Final    FEW WBC PRESENT, PREDOMINANTLY PMN RARE GRAM POSITIVE COCCI    Culture   Final    FEW STAPHYLOCOCCUS AUREUS NO ANAEROBES ISOLATED Performed at [facility], [place] [address] [place], [place] [address]    Report Status 11/22/2019 FINAL  Final   Organism ID, Bacteria STAPHYLOCOCCUS AUREUS  Final      Susceptibility   Staphylococcus aureus - MIC*    CIPROFLOXACIN <=0.5 SENSITIVE Sensitive     ERYTHROMYCIN <=0.25 SENSITIVE Sensitive     GENTAMICIN <=0.5 SENSITIVE Sensitive     OXACILLIN <=0.25 SENSITIVE Sensitive     TETRACYCLINE <=1 SENSITIVE Sensitive     VANCOMYCIN <=0.5 SENSITIVE Sensitive     TRIMETH/SULFA <=10 SENSITIVE Sensitive     CLINDAMYCIN >=8 RESISTANT Resistant     RIFAMPIN <=0.5 SENSITIVE Sensitive     Inducible Clindamycin NEGATIVE Sensitive     * FEW STAPHYLOCOCCUS AUREUS  Respiratory Panel by RT PCR (Flu A&B, Covid) - Nasopharyngeal Swab     Status: None   Collection Time: 11/22/19  6:05 PM   Specimen: Nasopharyngeal Swab  Result Value Ref Range Status   SARS Coronavirus 2 by RT PCR NEGATIVE NEGATIVE Final    Comment: (NOTE) SARS-CoV-2 target nucleic acids are NOT DETECTED.  The SARS-CoV-2 RNA is generally detectable in upper respiratoy specimens during the acute phase of infection. The lowest  concentration of SARS-CoV-2 viral copies this assay can detect is 131 copies/mL. A negative result does not preclude SARS-Cov-2 infection and should not be used as the sole basis for treatment or other patient management decisions. A negative result may occur with  improper specimen collection/handling, submission of specimen other than nasopharyngeal swab, presence of viral mutation(s) within the areas targeted by this assay, and inadequate number of viral copies (<131 copies/mL). A negative result must be combined with clinical observations, patient history, and epidemiological information. The expected result is Negative.  Fact Sheet for  Patients:  PinkCheek.be  Fact Sheet for Healthcare Providers:  GravelBags.it  This test is no t yet approved or cleared by the [place] FDA and  has been authorized for detection and/or diagnosis of SARS-CoV-2 by FDA under an Emergency Use Authorization (EUA). This EUA will remain  in effect (meaning this test can be used) for the duration of the COVID-19 declaration under Section 564(b)(1) of the Act, 21 U.S.C. section 360bbb-3(b)(1), unless the authorization is terminated or revoked sooner.     Influenza A by PCR NEGATIVE NEGATIVE Final   Influenza B by PCR NEGATIVE NEGATIVE Final    Comment: (NOTE) The Xpert Xpress SARS-CoV-2/FLU/RSV assay is intended as an aid in  the diagnosis of influenza from Nasopharyngeal swab specimens and  should not be used as a sole basis for treatment. Nasal washings and  aspirates are unacceptable for Xpert Xpress SARS-CoV-2/FLU/RSV  testing.  Fact Sheet for Patients: PinkCheek.be  Fact Sheet for Healthcare Providers: GravelBags.it  This test is not yet approved or cleared by the [place] FDA and  has been authorized for detection and/or diagnosis of SARS-CoV-2 by  FDA under an Emergency Use Authorization (EUA). This EUA will remain  in effect (meaning this test can be used) for the duration of the  Covid-19 declaration under Section 564(b)(1) of the Act, 21  U.S.C. section 360bbb-3(b)(1), unless the authorization is  terminated or revoked. Performed at [facility], [place] [address], [place], [place] [address]      Labs: BNP (last 3 results) No results for input(s): BNP in the last 8760 hours. Basic Metabolic Panel: Recent Labs  Lab 11/18/19 0401 11/19/19 0333 11/21/19 0331 11/22/19 0343  NA  --  133* 135 132*  K  --  5.1 4.6 4.7  CL  --  96* 98 97*  CO2  --  29 29 26   GLUCOSE  --  223*  172* 212*  BUN  --  25* 22 21  CREATININE 1.02* 1.10* 1.02* 1.06*  CALCIUM  --  8.6* 9.2 9.1  MG  --   --  1.6* 1.8   Liver Function Tests: Recent Labs  Lab 11/19/19 0333 11/21/19 0331 11/22/19 0343  AST 14* 16 20  ALT 11 9 7   ALKPHOS 78 59 61  BILITOT 0.3 0.4 0.4  PROT 5.9* 6.3* 6.5  ALBUMIN 2.3* 2.4* 2.9*   No results for input(s): LIPASE, AMYLASE in the last 168 hours. No results for input(s): AMMONIA in the last 168 hours. CBC: Recent Labs  Lab 11/19/19 0333 11/21/19 0331 11/22/19 0343  WBC 11.0* 10.7* 11.6*  HGB 9.5* 9.3* 9.8*  HCT 29.0* 29.2* 31.0*  MCV 95.7 96.7 100.0  PLT 357 362 405*   Cardiac Enzymes: No results for input(s): CKTOTAL, CKMB, CKMBINDEX, TROPONINI in the last 168 hours. BNP: Invalid input(s): POCBNP CBG: Recent Labs  Lab 11/23/19 1738 11/23/19 2127 11/24/19 0713 11/24/19 1157 11/24/19 1733  GLUCAP 219*  196* 161* 253* 147*   D-Dimer No results for input(s): DDIMER in the last 72 hours. Hgb A1c No results for input(s): HGBA1C in the last 72 hours. Lipid Profile No results for input(s): CHOL, HDL, LDLCALC, TRIG, CHOLHDL, LDLDIRECT in the last 72 hours. Thyroid function studies No results for input(s): TSH, T4TOTAL, T3FREE, THYROIDAB in the last 72 hours.  Invalid input(s): FREET3 Anemia work up No results for input(s): VITAMINB12, FOLATE, FERRITIN, TIBC, IRON, RETICCTPCT in the last 72 hours. Urinalysis    Component Value Date/Time   COLORURINE YELLOW 11/16/2019 1345   APPEARANCEUR CLEAR 11/16/2019 1345   LABSPEC 1.032 (H) 11/16/2019 1345   PHURINE 5.0 11/16/2019 1345   GLUCOSEU >=500 (A) 11/16/2019 1345   HGBUR NEGATIVE 11/16/2019 1345   BILIRUBINUR NEGATIVE 11/16/2019 1345   KETONESUR NEGATIVE 11/16/2019 1345   PROTEINUR NEGATIVE 11/16/2019 1345   NITRITE NEGATIVE 11/16/2019 1345   LEUKOCYTESUR NEGATIVE 11/16/2019 1345   Sepsis Labs Invalid input(s): PROCALCITONIN,  WBC,  LACTICIDVEN Microbiology Recent Results (from  the past 240 hour(s))  Respiratory Panel by RT PCR (Flu A&B, Covid) - Nasopharyngeal Swab     Status: None   Collection Time: 11/15/19  4:33 PM   Specimen: Nasopharyngeal Swab  Result Value Ref Range Status   SARS Coronavirus 2 by RT PCR NEGATIVE NEGATIVE Final    Comment: (NOTE) SARS-CoV-2 target nucleic acids are NOT DETECTED.  The SARS-CoV-2 RNA is generally detectable in upper respiratoy specimens during the acute phase of infection. The lowest concentration of SARS-CoV-2 viral copies this assay can detect is 131 copies/mL. A negative result does not preclude SARS-Cov-2 infection and should not be used as the sole basis for treatment or other patient management decisions. A negative result may occur with  improper specimen collection/handling, submission of specimen other than nasopharyngeal swab, presence of viral mutation(s) within the areas targeted by this assay, and inadequate number of viral copies (<131 copies/mL). A negative result must be combined with clinical observations, patient history, and epidemiological information. The expected result is Negative.  Fact Sheet for Patients:  PinkCheek.be  Fact Sheet for Healthcare Providers:  GravelBags.it  This test is no t yet approved or cleared by the [place] FDA and  has been authorized for detection and/or diagnosis of SARS-CoV-2 by FDA under an Emergency Use Authorization (EUA). This EUA will remain  in effect (meaning this test can be used) for the duration of the COVID-19 declaration under Section 564(b)(1) of the Act, 21 U.S.C. section 360bbb-3(b)(1), unless the authorization is terminated or revoked sooner.     Influenza A by PCR NEGATIVE NEGATIVE Final   Influenza B by PCR NEGATIVE NEGATIVE Final    Comment: (NOTE) The Xpert Xpress SARS-CoV-2/FLU/RSV assay is intended as an aid in  the diagnosis of influenza from Nasopharyngeal swab specimens and   should not be used as a sole basis for treatment. Nasal washings and  aspirates are unacceptable for Xpert Xpress SARS-CoV-2/FLU/RSV  testing.  Fact Sheet for Patients: PinkCheek.be  Fact Sheet for Healthcare Providers: GravelBags.it  This test is not yet approved or cleared by the [place] FDA and  has been authorized for detection and/or diagnosis of SARS-CoV-2 by  FDA under an Emergency Use Authorization (EUA). This EUA will remain  in effect (meaning this test can be used) for the duration of the  Covid-19 declaration under Section 564(b)(1) of the Act, 21  U.S.C. section 360bbb-3(b)(1), unless the authorization is  terminated or revoked. Performed at [facility],  [place] [address], [place], [place] [address]   Culture, blood (routine x 2)     Status: None   Collection Time: 11/15/19  4:45 PM   Specimen: BLOOD  Result Value Ref Range Status   Specimen Description   Final    BLOOD SITE NOT SPECIFIED Performed at [facility], [address], [place], [place] [address]    Special Requests   Final    BOTTLES DRAWN AEROBIC AND ANAEROBIC Blood Culture adequate volume Performed at [place] [address] [place], [place] [address]    Culture   Final    NO GROWTH 5 DAYS Performed at [facility], [place] [address] [place], [place] [address]    Report Status 11/20/2019 FINAL  Final  Wound or Superficial Culture     Status: None   Collection Time: 11/15/19  4:56 PM   Specimen: Toe; Wound  Result Value Ref Range Status   Specimen Description   Final    TOE LEFT Performed at [facility] [address], [place], [place] [address]    Special Requests   Final    NONE Performed at [facility], [place] [address], [place], [place] [address]    Gram Stain   Final    RARE WBC PRESENT, PREDOMINANTLY PMN FEW GRAM POSITIVE  COCCI Performed at [facility], [place] [address], [place], [place] [address]    Culture ABUNDANT STAPHYLOCOCCUS AUREUS  Final   Report Status 11/18/2019 FINAL  Final   Organism ID, Bacteria STAPHYLOCOCCUS AUREUS  Final      Susceptibility   Staphylococcus aureus - MIC*    CIPROFLOXACIN <=0.5 SENSITIVE Sensitive     ERYTHROMYCIN <=0.25 SENSITIVE Sensitive     GENTAMICIN <=0.5 SENSITIVE Sensitive     OXACILLIN <=0.25 SENSITIVE Sensitive     TETRACYCLINE <=1 SENSITIVE Sensitive     VANCOMYCIN <=0.5 SENSITIVE Sensitive     TRIMETH/SULFA <=10 SENSITIVE Sensitive     CLINDAMYCIN >=8 RESISTANT Resistant     RIFAMPIN <=0.5 SENSITIVE Sensitive     Inducible Clindamycin NEGATIVE Sensitive     * ABUNDANT STAPHYLOCOCCUS AUREUS  Aerobic/Anaerobic Culture (surgical/deep wound)     Status: None   Collection Time: 11/17/19  3:39 PM   Specimen: Soft Tissue, Other  Result Value Ref Range Status   Specimen Description   Final    TOE LEFT GREAT TOE Performed at [facility] [address], [place], [place] [address]    Special Requests   Final    NONE Performed at [facility], [place] [address], [place], [place] [address]    Gram Stain   Final    FEW WBC PRESENT, PREDOMINANTLY PMN RARE GRAM POSITIVE COCCI    Culture   Final    FEW STAPHYLOCOCCUS AUREUS NO ANAEROBES ISOLATED Performed at [facility], [place] [address] [place], [place] [address]    Report Status 11/22/2019 FINAL  Final   Organism ID, Bacteria STAPHYLOCOCCUS AUREUS  Final      Susceptibility   Staphylococcus aureus - MIC*    CIPROFLOXACIN <=0.5 SENSITIVE Sensitive     ERYTHROMYCIN <=0.25 SENSITIVE Sensitive     GENTAMICIN <=0.5 SENSITIVE Sensitive     OXACILLIN <=0.25 SENSITIVE Sensitive     TETRACYCLINE <=1 SENSITIVE Sensitive     VANCOMYCIN <=0.5 SENSITIVE Sensitive     TRIMETH/SULFA <=10 SENSITIVE Sensitive     CLINDAMYCIN >=8 RESISTANT Resistant  RIFAMPIN <=0.5  SENSITIVE Sensitive     Inducible Clindamycin NEGATIVE Sensitive     * FEW STAPHYLOCOCCUS AUREUS  Respiratory Panel by RT PCR (Flu A&B, Covid) - Nasopharyngeal Swab     Status: None   Collection Time: 11/22/19  6:05 PM   Specimen: Nasopharyngeal Swab  Result Value Ref Range Status   SARS Coronavirus 2 by RT PCR NEGATIVE NEGATIVE Final    Comment: (NOTE) SARS-CoV-2 target nucleic acids are NOT DETECTED.  The SARS-CoV-2 RNA is generally detectable in upper respiratoy specimens during the acute phase of infection. The lowest concentration of SARS-CoV-2 viral copies this assay can detect is 131 copies/mL. A negative result does not preclude SARS-Cov-2 infection and should not be used as the sole basis for treatment or other patient management decisions. A negative result may occur with  improper specimen collection/handling, submission of specimen other than nasopharyngeal swab, presence of viral mutation(s) within the areas targeted by this assay, and inadequate number of viral copies (<131 copies/mL). A negative result must be combined with clinical observations, patient history, and epidemiological information. The expected result is Negative.  Fact Sheet for Patients:  PinkCheek.be  Fact Sheet for Healthcare Providers:  GravelBags.it  This test is no t yet approved or cleared by the [place] FDA and  has been authorized for detection and/or diagnosis of SARS-CoV-2 by FDA under an Emergency Use Authorization (EUA). This EUA will remain  in effect (meaning this test can be used) for the duration of the COVID-19 declaration under Section 564(b)(1) of the Act, 21 U.S.C. section 360bbb-3(b)(1), unless the authorization is terminated or revoked sooner.     Influenza A by PCR NEGATIVE NEGATIVE Final   Influenza B by PCR NEGATIVE NEGATIVE Final    Comment: (NOTE) The Xpert Xpress SARS-CoV-2/FLU/RSV assay is intended as an  aid in  the diagnosis of influenza from Nasopharyngeal swab specimens and  should not be used as a sole basis for treatment. Nasal washings and  aspirates are unacceptable for Xpert Xpress SARS-CoV-2/FLU/RSV  testing.  Fact Sheet for Patients: PinkCheek.be  Fact Sheet for Healthcare Providers: GravelBags.it  This test is not yet approved or cleared by the [place] FDA and  has been authorized for detection and/or diagnosis of SARS-CoV-2 by  FDA under an Emergency Use Authorization (EUA). This EUA will remain  in effect (meaning this test can be used) for the duration of the  Covid-19 declaration under Section 564(b)(1) of the Act, 21  U.S.C. section 360bbb-3(b)(1), unless the authorization is  terminated or revoked. Performed at [facility], [place] [address], [place], [place] [address]      Time coordinating discharge: 32 minutes. SIGNED:   Hosie Poisson, MD  [facility] 11/24/2019, 7:18 PM

## 2019-11-24 NOTE — Progress Notes (Signed)
Inpatient Diabetes Program Recommendations  AACE/ADA: New Consensus Statement on Inpatient Glycemic Control   Target Ranges:  Prepandial:   less than 140 mg/dL      Peak postprandial:   less than 180 mg/dL (1-2 hours)      Critically ill patients:  140 - 180 mg/dL   Results for CERI, MAYER (MRN [id number]) as of 11/24/2019 14:43  Ref. Range 11/23/2019 07:21 11/23/2019 12:38 11/23/2019 17:38 11/23/2019 21:27 11/24/2019 07:13 11/24/2019 11:57  Glucose-Capillary Latest Ref Range: 70 - 99 mg/dL 206 (H) 255 (H) 219 (H) 196 (H) 161 (H) 253 (H)   Review of Glycemic Control  Current orders for Inpatient glycemic control: Lantus 40 units QHS, Novolog 5 units TID with meals, Novolog 0-15 units TID with meals, Novolog 0-5 units QHS, Metformin 500 mg BID  Inpatient Diabetes Program Recommendations:    Insulin: Please consider increasing meal coverage to Novolog 9 units TID with meals.  Thanks, Barnie Alderman, RN, MSN, CDE Diabetes Coordinator Inpatient Diabetes Program [phone] (Team Pager from 8am to 5pm)

## 2019-11-24 NOTE — Progress Notes (Addendum)
Spoke with patient about her diabetes. States that she is trying to find some other place to live at the beginning of the month. Has been living in a hotel before admission. States that she had been taking Lantus and Novolog, but wants the insulin pens since she has some needles packed away. Not sure where the needles are.  Very confusing to follow her story. She is trying to get money sent to her through friends and [place]. Her hgbA1C is 14.7%. She has not been taking her insulin at home.  Suggested the [employer] insulin pens: Novolin 70/30 Flexpen insulin pens are $42.88 for a box of 5 pens Novolin 70/30 insulin vial is $25 per bottle   Patient is hoping to be able to get insulin when needed until she is able to apply and get Medicaid in [place].  Harvel Ricks RN BSN CDE Diabetes Coordinator Pager: [phone]  8am-5pm

## 2019-11-25 DIAGNOSIS — E440 Moderate protein-calorie malnutrition: Secondary | ICD-10-CM | POA: Diagnosis not present

## 2019-11-25 DIAGNOSIS — N179 Acute kidney failure, unspecified: Secondary | ICD-10-CM | POA: Diagnosis not present

## 2019-11-25 LAB — GLUCOSE, CAPILLARY
Glucose-Capillary: 199 mg/dL — ABNORMAL HIGH (ref 70–99)
Glucose-Capillary: 186 mg/dL — ABNORMAL HIGH (ref 70–99)

## 2019-11-25 MED ORDER — ATORVASTATIN CALCIUM 40 MG PO TABS
40.0000 mg | ORAL_TABLET | Freq: Every day | ORAL | 0 refills | Status: DC
Start: 2019-11-25 — End: 2020-01-02

## 2019-11-25 MED ORDER — CLOPIDOGREL BISULFATE 75 MG PO TABS
75.0000 mg | ORAL_TABLET | Freq: Every day | ORAL | 1 refills | Status: DC
Start: 2019-11-25 — End: 2020-01-02

## 2019-11-25 MED ORDER — FENOFIBRATE 145 MG PO TABS
145.0000 mg | ORAL_TABLET | Freq: Every day | ORAL | 1 refills | Status: DC
Start: 2019-11-25 — End: 2020-01-02

## 2019-11-25 MED ORDER — METFORMIN HCL 500 MG PO TABS
500.0000 mg | ORAL_TABLET | Freq: Two times a day (BID) | ORAL | 1 refills | Status: DC
Start: 2019-11-25 — End: 2020-01-02

## 2019-11-25 MED ORDER — CEPHALEXIN 500 MG PO CAPS: 500.0000 mg | ORAL_CAPSULE | Freq: Two times a day (BID) | ORAL | 0 refills | Status: AC

## 2019-11-25 MED ORDER — ASPIRIN EC 81 MG PO TBEC: 81.0000 mg | DELAYED_RELEASE_TABLET | Freq: Every day | ORAL | 0 refills | Status: AC

## 2019-11-25 MED ORDER — METOPROLOL TARTRATE 50 MG PO TABS
50.0000 mg | ORAL_TABLET | Freq: Every day | ORAL | 1 refills | Status: DC
Start: 2019-11-25 — End: 2019-12-29

## 2019-11-25 MED ORDER — LAMOTRIGINE 100 MG PO TABS
100.0000 mg | ORAL_TABLET | Freq: Two times a day (BID) | ORAL | 1 refills | Status: DC
Start: 2019-11-25 — End: 2019-12-29

## 2019-11-25 MED ORDER — SERTRALINE HCL 100 MG PO TABS
100.0000 mg | ORAL_TABLET | Freq: Two times a day (BID) | ORAL | 1 refills | Status: DC
Start: 2019-11-25 — End: 2020-01-02

## 2019-11-25 MED ORDER — GABAPENTIN 800 MG PO TABS
800.0000 mg | ORAL_TABLET | Freq: Three times a day (TID) | ORAL | 0 refills | Status: DC
Start: 2019-11-25 — End: 2020-01-02

## 2019-11-25 NOTE — Progress Notes (Signed)
Discharge instructions were given to the Pt. Educated the importance of managing blood glucose level. Instructed the Pt to complete the antibiotic course. Also instructed to follow up in 1 week. Pt verbalized she understands the discharge instructions.

## 2019-11-27 NOTE — Discharge Summary (Signed)
Physician Discharge Summary  Pamela Knight DHR:[id number] DOB: 05-12-1960 DOA: 11/15/2019  PCP: Patient, No Pcp Per  Admit date: 11/15/2019 Discharge date: 11/27/2019  Admitted From: Home.  Disposition: Home.   Recommendations for Outpatient Follow-up:  1. Follow up with PCP in 1-2 weeks 2. Please obtain BMP/CBC in one week Please follow up with orthopedics as needed/recommended.   Discharge Condition: Stable.  CODE STATUS:DNR  Diet recommendation: Heart Healthy  Brief/Interim Summary: 59 year old lady with prior history of coronary artery disease s/p CABG, diabetes mellitus, hypertension, hyperlipidemia presents to ED for generalized weakness, fevers and chills patient reports that she lives at section 8 housing in [place], apparently lost her housing and moved to [place] she was currently living in a motel prior to admission.  On arrival to ED she was found to have a left great toe wound progressively getting worse with increased pain and foul-smelling drainage.  Imaging of the toe showed osteomyelitis.  Orthopedic surgery comes consulted underwent toe amputation on 11/17/2019.   Wound cultures from the wound show MSSA. She is on oral antibiotics and is stable to be discharged. TOC on board and she had a bed at [facility], but patient refused to go to SNF in [place] . She wanted to go to a hotel and reported that she has insulin in the refrigerator. We gave her a prescription for physical therapy and the rest o the medications.      Discharge Diagnoses:  Active Problems:   Uncontrolled type 2 diabetes mellitus with stage 2 chronic kidney disease, with long-term current use of insulin (HCC)   Essential hypertension, benign   Hypercholesteremia   DM type 2 causing vascular disease ([place])   Personal history of noncompliance with medical treatment, presenting hazards to health   Osteomyelitis ([place])   Malnutrition of moderate degree  Osteomyelitis of the  great toe S/p amputation by orthopedic surgery on 11/17/2019.  Intraoperative cultures showing MSSA. Blood cultures have been negative.  Complete the course of oral antibiotics.    Type 2 diabetes mellitus  CBG (last 3)  Recent Labs    11/24/19 2143 11/25/19 0716 11/25/19 1210  GLUCAP 122* 199* 186*   Uncontrolled with hyperglycemia Increase her lantus to her home dose along with NOVOLOG TIDAC.  On discharge.    Pseudohyponatremia probably secondary to elevated CBGs.    Hyperlipidemia Continue with the Lipitor  Hypertension Optimal BP parameters.  Continue with metoprolol   Presumed chronic diastolic heart failure Patient appears to be euvolemic.     Stage IIIa CKD Creatinine appears to be at baseline.    Moderate malnutrition Nutrition consulted.  Hypomagnesemia Replaced    Discharge Instructions  Discharge Instructions    Diet - low sodium heart healthy   Complete by: As directed    Diet - low sodium heart healthy   Complete by: As directed    Diet Carb Modified   Complete by: As directed    Discharge instructions   Complete by: As directed    Please follow up with PCP in one week, please follow up with orthopedics as recommended.   Discharge wound care:   Complete by: As directed    Dressing changes as per orthopedics.   Discharge wound care:   Complete by: As directed    Wound care as per orthopedics.   Increase activity slowly   Complete by: As directed      Allergies as of 11/25/2019      Reactions   Avandia [  rosiglitazone] Other (See Comments)   unk   Niacin And Related Other (See Comments)   unk      Medication List    STOP taking these medications   buPROPion 100 MG 12 hr tablet Commonly known as: WELLBUTRIN SR   citalopram 20 MG tablet Commonly known as: CELEXA   hydrOXYzine 25 MG capsule Commonly known as: VISTARIL   lisinopril 10 MG tablet Commonly known as: ZESTRIL     TAKE these  medications   aspirin EC 81 MG tablet Take 1 tablet (81 mg total) by mouth daily.   atorvastatin 40 MG tablet Commonly known as: LIPITOR Take 1 tablet (40 mg total) by mouth daily.   CENTRUM SILVER PO Take by mouth.   cephALEXin 500 MG capsule Commonly known as: KEFLEX Take 1 capsule (500 mg total) by mouth 2 (two) times daily for 7 days.   clopidogrel 75 MG tablet Commonly known as: PLAVIX Take 1 tablet (75 mg total) by mouth daily.   feeding supplement (GLUCERNA SHAKE) Liqd Take 237 mLs by mouth 3 (three) times daily between meals.   fenofibrate 145 MG tablet Commonly known as: TRICOR Take 1 tablet (145 mg total) by mouth daily.   gabapentin 800 MG tablet Commonly known as: NEURONTIN Take 1 tablet (800 mg total) by mouth 3 (three) times daily.   hydrocortisone 2.5 % rectal cream Commonly known as: ANUSOL-HC Place rectally 4 (four) times daily.   insulin aspart 100 UNIT/ML injection Commonly known as: novoLOG Inject 3 Units into the skin 3 (three) times daily with meals. What changed: how much to take   insulin aspart 100 UNIT/ML injection Commonly known as: novoLOG CBG 70 - 120: 0 units CBG 121 - 150: 2 units CBG 151 - 200: 3 units CBG 201 - 250: 5 units CBG 251 - 300: 8 units CBG 301 - 350: 11 units CBG 351 - 400: 15 units What changed: You were already taking a medication with the same name, and this prescription was added. Make sure you understand how and when to take each.   insulin glargine 100 UNIT/ML injection Commonly known as: LANTUS Inject 0.6 mLs (60 Units total) into the skin at bedtime.   lamoTRIgine 100 MG tablet Commonly known as: LAMICTAL Take 1 tablet (100 mg total) by mouth 2 (two) times daily.   metFORMIN 500 MG tablet Commonly known as: GLUCOPHAGE Take 1 tablet (500 mg total) by mouth 2 (two) times daily with a meal.   metoprolol tartrate 50 MG tablet Commonly known as: LOPRESSOR Take 1 tablet (50 mg total) by mouth daily.    sertraline 100 MG tablet Commonly known as: ZOLOFT Take 1 tablet (100 mg total) by mouth in the morning and at bedtime.   vitamin B-12 1000 MCG tablet Commonly known as: CYANOCOBALAMIN Take 1,000 mcg by mouth daily.            Discharge Care Instructions  (From admission, onward)         Start     Ordered   11/25/19 0000  Discharge wound care:       Comments: Wound care as per orthopedics.   11/25/19 1649   11/23/19 0000  Discharge wound care:       Comments: Dressing changes as per orthopedics.   11/23/19 1030          Follow-up Information    Erle Crocker, MD. Schedule an appointment as soon as possible for a visit in 1 week.   Specialty:  Orthopedic Surgery Contact information: [place] [address] [phone]              Allergies  Allergen Reactions  . Avandia [Rosiglitazone] Other (See Comments)    unk  . Niacin And Related Other (See Comments)    unk    Consultations:  Orthopedics.    Procedures/Studies: MR FOOT LEFT WO CONTRAST  Result Date: 11/16/2019 CLINICAL DATA:  Diabetic patient with pain and swelling of the left great toe. Wound on the dorsal aspect of the great toe. EXAM: MRI OF THE LEFT FOOT WITHOUT CONTRAST TECHNIQUE: Multiplanar, multisequence MR imaging of the left foot was performed. No intravenous contrast was administered. COMPARISON:  Plain films left foot 11/15/2019. FINDINGS: Bones/Joint/Cartilage There is osteolysis of the majority of the distal phalanx of the great toe. Remnant of the distal phalanx demonstrates edema and decreased T1 signal consistent with osteomyelitis. Patchy, mild marrow edema is also seen in the head and neck of the proximal phalanx of the great toe. Bone marrow signal is otherwise unremarkable. Ligaments Appear intact. Muscles and Tendons No intramuscular fluid collection. Intermediate increased T2 signal in all intrinsic musculature is most consistent with diabetic myopathy. There  is mild intrasubstance increased T2 signal in the distal aspect of the flexor tendon of the great toe. Fluid is seen in the distal 1.5 cm of the sheath of the flexor tendon. Soft tissues Marked soft tissue swelling is present about the great toe. There is a fluid collection in the great toe. Discrete measurement of the collection is not possible. It originates at a skin wound on the dorsal aspect of the toe and extends along the lateral margin of the head and neck of the proximal phalanx. The collection measures up to 2.1 cm transverse by 1.7 cm long in the plantar soft tissues. Small first MTP joint effusion noted. IMPRESSION: Marrow edema throughout the remnant of the distal phalanx of the great toe consistent with osteomyelitis. Milder degree of edema in the head and neck of the proximal phalanx of the great toe is also likely due to osteomyelitis. Skin wound on the great toe with an underlying fluid collection consistent with abscess as described above. Fluid in approximately the distal 1.5 cm of the sheath of the flexor tendon of the great toe is worrisome for septic tenosynovitis. Electronically Signed   By: Inge Rise M.D.   On: 11/16/2019 12:31   DG Chest Port 1 View  Result Date: 11/15/2019 CLINICAL DATA:  Fever, weakness. EXAM: PORTABLE CHEST 1 VIEW COMPARISON:  None. FINDINGS: The heart size and mediastinal contours are within normal limits. Both lungs are clear. The visualized skeletal structures are unremarkable. IMPRESSION: No active disease. Electronically Signed   By: Marijo Conception M.D.   On: 11/15/2019 16:45   DG Foot Complete Left  Result Date: 11/15/2019 CLINICAL DATA:  59 year old female with diabetes and toe pain. Fever and chills. History of wound to the great toe. EXAM: LEFT FOOT - COMPLETE 3+ VIEW COMPARISON:  None. FINDINGS: There is resorption of the majority of the distal phalanx of the great toe, likely sequela of longstanding osteomyelitis. There is no acute fracture or  dislocation. There is diffuse soft tissue swelling of the great toe. No radiopaque foreign object or soft tissue gas. IMPRESSION: 1. No acute fracture or dislocation. 2. Resorption of the majority of the distal phalanx of the great toe. 3. Diffuse soft tissue swelling of the great toe. Electronically Signed   By: Anner Crete  M.D.   On: 11/15/2019 16:47      Subjective: No new complaints.   Discharge Exam: Vitals:   11/25/19 0527 11/25/19 1212  BP: 122/82 107/66  Pulse: 76 64  Resp: 16 16  Temp: 98.4 F (36.9 C) 97.9 F (36.6 C)  SpO2: 100% 96%   Vitals:   11/24/19 1358 11/24/19 2146 11/25/19 0527 11/25/19 1212  BP: 133/75 (!) 158/93 122/82 107/66  Pulse: 78 83 76 64  Resp: 20 16 16 16   Temp: 98.1 F (36.7 C)  98.4 F (36.9 C) 97.9 F (36.6 C)  TempSrc: Oral  Oral Oral  SpO2: 100% 100% 100% 96%  Weight:      Height:        General: Pt is alert, awake, not in acute distress Cardiovascular: RRR, S1/S2 +, no rubs, no gallops Respiratory: CTA bilaterally, no wheezing, no rhonchi Abdominal: Soft, NT, ND, bowel sounds + Extremities: no edema, no cyanosis    The results of significant diagnostics from this hospitalization (including imaging, microbiology, ancillary and laboratory) are listed below for reference.     Microbiology: Recent Results (from the past 240 hour(s))  Aerobic/Anaerobic Culture (surgical/deep wound)     Status: None   Collection Time: 11/17/19  3:39 PM   Specimen: Soft Tissue, Other  Result Value Ref Range Status   Specimen Description   Final    TOE LEFT GREAT TOE Performed at [facility] [address], [place], [place] [address]    Special Requests   Final    NONE Performed at [facility], [place] [address] [place], [place] [address]    Gram Stain   Final    FEW WBC PRESENT, PREDOMINANTLY PMN RARE GRAM POSITIVE COCCI    Culture   Final    FEW STAPHYLOCOCCUS AUREUS NO ANAEROBES  ISOLATED Performed at [facility], [place] [address], [place], [place] [address]    Report Status 11/22/2019 FINAL  Final   Organism ID, Bacteria STAPHYLOCOCCUS AUREUS  Final      Susceptibility   Staphylococcus aureus - MIC*    CIPROFLOXACIN <=0.5 SENSITIVE Sensitive     ERYTHROMYCIN <=0.25 SENSITIVE Sensitive     GENTAMICIN <=0.5 SENSITIVE Sensitive     OXACILLIN <=0.25 SENSITIVE Sensitive     TETRACYCLINE <=1 SENSITIVE Sensitive     VANCOMYCIN <=0.5 SENSITIVE Sensitive     TRIMETH/SULFA <=10 SENSITIVE Sensitive     CLINDAMYCIN >=8 RESISTANT Resistant     RIFAMPIN <=0.5 SENSITIVE Sensitive     Inducible Clindamycin NEGATIVE Sensitive     * FEW STAPHYLOCOCCUS AUREUS  Respiratory Panel by RT PCR (Flu A&B, Covid) - Nasopharyngeal Swab     Status: None   Collection Time: 11/22/19  6:05 PM   Specimen: Nasopharyngeal Swab  Result Value Ref Range Status   SARS Coronavirus 2 by RT PCR NEGATIVE NEGATIVE Final    Comment: (NOTE) SARS-CoV-2 target nucleic acids are NOT DETECTED.  The SARS-CoV-2 RNA is generally detectable in upper respiratoy specimens during the acute phase of infection. The lowest concentration of SARS-CoV-2 viral copies this assay can detect is 131 copies/mL. A negative result does not preclude SARS-Cov-2 infection and should not be used as the sole basis for treatment or other patient management decisions. A negative result may occur with  improper specimen collection/handling, submission of specimen other than nasopharyngeal swab, presence of viral mutation(s) within the areas targeted by this assay, and inadequate number of viral copies (<131 copies/mL). A negative result  must be combined with clinical observations, patient history, and epidemiological information. The expected result is Negative.  Fact Sheet for Patients:  PinkCheek.be  Fact Sheet for Healthcare Providers:  GravelBags.it  This test  is no t yet approved or cleared by the [place] FDA and  has been authorized for detection and/or diagnosis of SARS-CoV-2 by FDA under an Emergency Use Authorization (EUA). This EUA will remain  in effect (meaning this test can be used) for the duration of the COVID-19 declaration under Section 564(b)(1) of the Act, 21 U.S.C. section 360bbb-3(b)(1), unless the authorization is terminated or revoked sooner.     Influenza A by PCR NEGATIVE NEGATIVE Final   Influenza B by PCR NEGATIVE NEGATIVE Final    Comment: (NOTE) The Xpert Xpress SARS-CoV-2/FLU/RSV assay is intended as an aid in  the diagnosis of influenza from Nasopharyngeal swab specimens and  should not be used as a sole basis for treatment. Nasal washings and  aspirates are unacceptable for Xpert Xpress SARS-CoV-2/FLU/RSV  testing.  Fact Sheet for Patients: PinkCheek.be  Fact Sheet for Healthcare Providers: GravelBags.it  This test is not yet approved or cleared by the [place] FDA and  has been authorized for detection and/or diagnosis of SARS-CoV-2 by  FDA under an Emergency Use Authorization (EUA). This EUA will remain  in effect (meaning this test can be used) for the duration of the  Covid-19 declaration under Section 564(b)(1) of the Act, 21  U.S.C. section 360bbb-3(b)(1), unless the authorization is  terminated or revoked. Performed at [facility], [place] [address], [place], [place] [address]      Labs: BNP (last 3 results) No results for input(s): BNP in the last 8760 hours. Basic Metabolic Panel: Recent Labs  Lab 11/21/19 0331 11/22/19 0343  NA 135 132*  K 4.6 4.7  CL 98 97*  CO2 29 26  GLUCOSE 172* 212*  BUN 22 21  CREATININE 1.02* 1.06*  CALCIUM 9.2 9.1  MG 1.6* 1.8   Liver Function Tests: Recent Labs  Lab 11/21/19 0331 11/22/19 0343  AST 16 20  ALT 9 7  ALKPHOS 59 61  BILITOT 0.4 0.4  PROT 6.3* 6.5   ALBUMIN 2.4* 2.9*   No results for input(s): LIPASE, AMYLASE in the last 168 hours. No results for input(s): AMMONIA in the last 168 hours. CBC: Recent Labs  Lab 11/21/19 0331 11/22/19 0343  WBC 10.7* 11.6*  HGB 9.3* 9.8*  HCT 29.2* 31.0*  MCV 96.7 100.0  PLT 362 405*   Cardiac Enzymes: No results for input(s): CKTOTAL, CKMB, CKMBINDEX, TROPONINI in the last 168 hours. BNP: Invalid input(s): POCBNP CBG: Recent Labs  Lab 11/24/19 1157 11/24/19 1733 11/24/19 2143 11/25/19 0716 11/25/19 1210  GLUCAP 253* 147* 122* 199* 186*   D-Dimer No results for input(s): DDIMER in the last 72 hours. Hgb A1c No results for input(s): HGBA1C in the last 72 hours. Lipid Profile No results for input(s): CHOL, HDL, LDLCALC, TRIG, CHOLHDL, LDLDIRECT in the last 72 hours. Thyroid function studies No results for input(s): TSH, T4TOTAL, T3FREE, THYROIDAB in the last 72 hours.  Invalid input(s): FREET3 Anemia work up No results for input(s): VITAMINB12, FOLATE, FERRITIN, TIBC, IRON, RETICCTPCT in the last 72 hours. Urinalysis    Component Value Date/Time   COLORURINE YELLOW 11/16/2019 1345   APPEARANCEUR CLEAR 11/16/2019 1345   LABSPEC 1.032 (H) 11/16/2019 1345   PHURINE 5.0 11/16/2019 1345   GLUCOSEU >=500 (A) 11/16/2019 1345   HGBUR NEGATIVE 11/16/2019 1345  BILIRUBINUR NEGATIVE 11/16/2019 1345   KETONESUR NEGATIVE 11/16/2019 1345   PROTEINUR NEGATIVE 11/16/2019 1345   NITRITE NEGATIVE 11/16/2019 1345   LEUKOCYTESUR NEGATIVE 11/16/2019 1345   Sepsis Labs Invalid input(s): PROCALCITONIN,  WBC,  LACTICIDVEN Microbiology Recent Results (from the past 240 hour(s))  Aerobic/Anaerobic Culture (surgical/deep wound)     Status: None   Collection Time: 11/17/19  3:39 PM   Specimen: Soft Tissue, Other  Result Value Ref Range Status   Specimen Description   Final    TOE LEFT GREAT TOE Performed at [facility] [address], [place], [place] [address]     Special Requests   Final    NONE Performed at [facility], [place] [address], [place], [place] [address]    Gram Stain   Final    FEW WBC PRESENT, PREDOMINANTLY PMN RARE GRAM POSITIVE COCCI    Culture   Final    FEW STAPHYLOCOCCUS AUREUS NO ANAEROBES ISOLATED Performed at [facility], [place] [address] [place],  [address]    Report Status 11/22/2019 FINAL  Final   Organism ID, Bacteria STAPHYLOCOCCUS AUREUS  Final      Susceptibility   Staphylococcus aureus - MIC*    CIPROFLOXACIN <=0.5 SENSITIVE Sensitive     ERYTHROMYCIN <=0.25 SENSITIVE Sensitive     GENTAMICIN <=0.5 SENSITIVE Sensitive     OXACILLIN <=0.25 SENSITIVE Sensitive     TETRACYCLINE <=1 SENSITIVE Sensitive     VANCOMYCIN <=0.5 SENSITIVE Sensitive     TRIMETH/SULFA <=10 SENSITIVE Sensitive     CLINDAMYCIN >=8 RESISTANT Resistant     RIFAMPIN <=0.5 SENSITIVE Sensitive     Inducible Clindamycin NEGATIVE Sensitive     * FEW STAPHYLOCOCCUS AUREUS  Respiratory Panel by RT PCR (Flu A&B, Covid) - Nasopharyngeal Swab     Status: None   Collection Time: 11/22/19  6:05 PM   Specimen: Nasopharyngeal Swab  Result Value Ref Range Status   SARS Coronavirus 2 by RT PCR NEGATIVE NEGATIVE Final    Comment: (NOTE) SARS-CoV-2 target nucleic acids are NOT DETECTED.  The SARS-CoV-2 RNA is generally detectable in upper respiratoy specimens during the acute phase of infection. The lowest concentration of SARS-CoV-2 viral copies this assay can detect is 131 copies/mL. A negative result does not preclude SARS-Cov-2 infection and should not be used as the sole basis for treatment or other patient management decisions. A negative result may occur with  improper specimen collection/handling, submission of specimen other than nasopharyngeal swab, presence of viral mutation(s) within the areas targeted by this assay, and inadequate number of viral copies (<131 copies/mL). A negative result must be combined  with clinical observations, patient history, and epidemiological information. The expected result is Negative.  Fact Sheet for Patients:  PinkCheek.be  Fact Sheet for Healthcare Providers:  GravelBags.it  This test is no t yet approved or cleared by the [place] FDA and  has been authorized for detection and/or diagnosis of SARS-CoV-2 by FDA under an Emergency Use Authorization (EUA). This EUA will remain  in effect (meaning this test can be used) for the duration of the COVID-19 declaration under Section 564(b)(1) of the Act, 21 U.S.C. section 360bbb-3(b)(1), unless the authorization is terminated or revoked sooner.     Influenza A by PCR NEGATIVE NEGATIVE Final   Influenza B by PCR NEGATIVE NEGATIVE Final    Comment: (NOTE) The Xpert Xpress SARS-CoV-2/FLU/RSV assay is intended as an aid in  the diagnosis of influenza from Nasopharyngeal swab specimens  and  should not be used as a sole basis for treatment. Nasal washings and  aspirates are unacceptable for Xpert Xpress SARS-CoV-2/FLU/RSV  testing.  Fact Sheet for Patients: PinkCheek.be  Fact Sheet for Healthcare Providers: GravelBags.it  This test is not yet approved or cleared by the [place] FDA and  has been authorized for detection and/or diagnosis of SARS-CoV-2 by  FDA under an Emergency Use Authorization (EUA). This EUA will remain  in effect (meaning this test can be used) for the duration of the  Covid-19 declaration under Section 564(b)(1) of the Act, 21  U.S.C. section 360bbb-3(b)(1), unless the authorization is  terminated or revoked. Performed at [facility], [place] [address], [place], [place] [address]      Time coordinating discharge: 32 minutes. SIGNED:   Hosie Poisson, MD  [facility]

## 2019-12-25 ENCOUNTER — Other Ambulatory Visit: Payer: Self-pay

## 2019-12-25 ENCOUNTER — Observation Stay (HOSPITAL_COMMUNITY)
Admission: EM | Admit: 2019-12-25 | Discharge: 2019-12-29 | Disposition: A | Discharge status: Home / Self Care | Payer: Medicaid - Out of State | Attending: Family Medicine | Admitting: Family Medicine

## 2019-12-25 ENCOUNTER — Encounter (HOSPITAL_COMMUNITY): Payer: Self-pay | Admitting: Emergency Medicine

## 2019-12-25 DIAGNOSIS — K5900 Constipation, unspecified: Secondary | ICD-10-CM

## 2019-12-25 DIAGNOSIS — Z9119 Patient's noncompliance with other medical treatment and regimen: Secondary | ICD-10-CM

## 2019-12-25 DIAGNOSIS — Z20822 Contact with and (suspected) exposure to covid-19: Secondary | ICD-10-CM | POA: Diagnosis not present

## 2019-12-25 DIAGNOSIS — R1031 Right lower quadrant pain: Secondary | ICD-10-CM

## 2019-12-25 DIAGNOSIS — Z794 Long term (current) use of insulin: Secondary | ICD-10-CM | POA: Diagnosis not present

## 2019-12-25 DIAGNOSIS — E1100 Type 2 diabetes mellitus with hyperosmolarity without nonketotic hyperglycemic-hyperosmolar coma (NKHHC): Secondary | ICD-10-CM | POA: Diagnosis present

## 2019-12-25 DIAGNOSIS — R531 Weakness: Secondary | ICD-10-CM

## 2019-12-25 DIAGNOSIS — E1101 Type 2 diabetes mellitus with hyperosmolarity with coma: Secondary | ICD-10-CM | POA: Diagnosis present

## 2019-12-25 DIAGNOSIS — I10 Essential (primary) hypertension: Secondary | ICD-10-CM | POA: Diagnosis not present

## 2019-12-25 DIAGNOSIS — R195 Other fecal abnormalities: Secondary | ICD-10-CM

## 2019-12-25 DIAGNOSIS — K6289 Other specified diseases of anus and rectum: Secondary | ICD-10-CM

## 2019-12-25 DIAGNOSIS — E871 Hypo-osmolality and hyponatremia: Secondary | ICD-10-CM

## 2019-12-25 DIAGNOSIS — E860 Dehydration: Secondary | ICD-10-CM

## 2019-12-25 DIAGNOSIS — R7989 Other specified abnormal findings of blood chemistry: Secondary | ICD-10-CM

## 2019-12-25 DIAGNOSIS — E1165 Type 2 diabetes mellitus with hyperglycemia: Secondary | ICD-10-CM | POA: Diagnosis not present

## 2019-12-25 DIAGNOSIS — E872 Acidosis, unspecified: Secondary | ICD-10-CM

## 2019-12-25 DIAGNOSIS — R739 Hyperglycemia, unspecified: Secondary | ICD-10-CM

## 2019-12-25 DIAGNOSIS — C210 Malignant neoplasm of anus, unspecified: Secondary | ICD-10-CM

## 2019-12-25 DIAGNOSIS — Z91199 Patient's noncompliance with other medical treatment and regimen due to unspecified reason: Secondary | ICD-10-CM

## 2019-12-25 DIAGNOSIS — Z87891 Personal history of nicotine dependence: Secondary | ICD-10-CM | POA: Insufficient documentation

## 2019-12-25 DIAGNOSIS — Z79899 Other long term (current) drug therapy: Secondary | ICD-10-CM | POA: Diagnosis not present

## 2019-12-25 DIAGNOSIS — Z7982 Long term (current) use of aspirin: Secondary | ICD-10-CM | POA: Diagnosis not present

## 2019-12-25 DIAGNOSIS — E7800 Pure hypercholesterolemia, unspecified: Secondary | ICD-10-CM | POA: Diagnosis present

## 2019-12-25 LAB — CBG MONITORING, ED: Glucose-Capillary: >600 mg/dL (ref 70–99)

## 2019-12-25 NOTE — ED Triage Notes (Signed)
Pt arrives EMS for hyperglycemia. Pt states she hasn't been taking her medications for months now. Pt also says that she drank a lot of orange juice today. Per EMS pt is currently homeless and was staying with a friend.

## 2019-12-26 ENCOUNTER — Emergency Department (HOSPITAL_COMMUNITY): Payer: Medicaid - Out of State

## 2019-12-26 ENCOUNTER — Encounter (HOSPITAL_COMMUNITY): Payer: Self-pay | Admitting: Internal Medicine

## 2019-12-26 ENCOUNTER — Observation Stay (HOSPITAL_COMMUNITY): Payer: Medicaid - Out of State

## 2019-12-26 DIAGNOSIS — I10 Essential (primary) hypertension: Secondary | ICD-10-CM

## 2019-12-26 DIAGNOSIS — R7989 Other specified abnormal findings of blood chemistry: Secondary | ICD-10-CM

## 2019-12-26 DIAGNOSIS — E860 Dehydration: Secondary | ICD-10-CM | POA: Diagnosis not present

## 2019-12-26 DIAGNOSIS — R195 Other fecal abnormalities: Secondary | ICD-10-CM | POA: Diagnosis not present

## 2019-12-26 DIAGNOSIS — E1165 Type 2 diabetes mellitus with hyperglycemia: Secondary | ICD-10-CM | POA: Diagnosis not present

## 2019-12-26 DIAGNOSIS — E7800 Pure hypercholesterolemia, unspecified: Secondary | ICD-10-CM

## 2019-12-26 DIAGNOSIS — E871 Hypo-osmolality and hyponatremia: Secondary | ICD-10-CM

## 2019-12-26 DIAGNOSIS — R1031 Right lower quadrant pain: Secondary | ICD-10-CM

## 2019-12-26 DIAGNOSIS — K5900 Constipation, unspecified: Secondary | ICD-10-CM | POA: Diagnosis not present

## 2019-12-26 DIAGNOSIS — Z794 Long term (current) use of insulin: Secondary | ICD-10-CM

## 2019-12-26 DIAGNOSIS — E872 Acidosis, unspecified: Secondary | ICD-10-CM

## 2019-12-26 DIAGNOSIS — E1100 Type 2 diabetes mellitus with hyperosmolarity without nonketotic hyperglycemic-hyperosmolar coma (NKHHC): Secondary | ICD-10-CM

## 2019-12-26 DIAGNOSIS — Z9119 Patient's noncompliance with other medical treatment and regimen: Secondary | ICD-10-CM

## 2019-12-26 DIAGNOSIS — R739 Hyperglycemia, unspecified: Secondary | ICD-10-CM

## 2019-12-26 LAB — URINALYSIS, ROUTINE W REFLEX MICROSCOPIC
Bacteria, UA: NONE SEEN
Bilirubin Urine: NEGATIVE
Glucose, UA: >=500 mg/dL — AB
Ketones, ur: NEGATIVE mg/dL
Nitrite: NEGATIVE
Protein, ur: NEGATIVE mg/dL
Specific Gravity, Urine: 1.024 (ref 1.005–1.030)
pH: 7 (ref 5.0–8.0)

## 2019-12-26 LAB — BASIC METABOLIC PANEL
Anion gap: 9 (ref 5–15)
Anion gap: 10 (ref 5–15)
Anion gap: 10 (ref 5–15)
Anion gap: 8 (ref 5–15)
BUN: 20 mg/dL (ref 8–23)
BUN: 18 mg/dL (ref 8–23)
BUN: 23 mg/dL (ref 8–23)
BUN: 17 mg/dL (ref 8–23)
CO2: 26 mmol/L (ref 22–32)
CO2: 23 mmol/L (ref 22–32)
CO2: 27 mmol/L (ref 22–32)
CO2: 26 mmol/L (ref 22–32)
Calcium: 8.7 mg/dL — ABNORMAL LOW (ref 8.9–10.3)
Calcium: 9.1 mg/dL (ref 8.9–10.3)
Calcium: 9.2 mg/dL (ref 8.9–10.3)
Calcium: 8.7 mg/dL — ABNORMAL LOW (ref 8.9–10.3)
Chloride: 94 mmol/L — ABNORMAL LOW (ref 98–111)
Chloride: 101 mmol/L (ref 98–111)
Chloride: 85 mmol/L — ABNORMAL LOW (ref 98–111)
Chloride: 101 mmol/L (ref 98–111)
Creatinine, Ser: 0.93 mg/dL (ref 0.44–1.00)
Creatinine, Ser: 0.72 mg/dL (ref 0.44–1.00)
Creatinine, Ser: 1.02 mg/dL — ABNORMAL HIGH (ref 0.44–1.00)
Creatinine, Ser: 0.69 mg/dL (ref 0.44–1.00)
GFR, Estimated: >60 mL/min (ref >60)
GFR, Estimated: >60 mL/min (ref >60)
GFR, Estimated: >60 mL/min (ref >60)
GFR, Estimated: >60 mL/min (ref >60)
Glucose, Bld: 517 mg/dL (ref 70–99)
Glucose, Bld: 254 mg/dL — ABNORMAL HIGH (ref 70–99)
Glucose, Bld: 837 mg/dL (ref 70–99)
Glucose, Bld: 170 mg/dL — ABNORMAL HIGH (ref 70–99)
Potassium: 3.5 mmol/L (ref 3.5–5.1)
Potassium: 3.8 mmol/L (ref 3.5–5.1)
Potassium: 4.6 mmol/L (ref 3.5–5.1)
Potassium: 3.6 mmol/L (ref 3.5–5.1)
Sodium: 129 mmol/L — ABNORMAL LOW (ref 135–145)
Sodium: 122 mmol/L — ABNORMAL LOW (ref 135–145)
Sodium: 134 mmol/L — ABNORMAL LOW (ref 135–145)
Sodium: 135 mmol/L (ref 135–145)

## 2019-12-26 LAB — COMPREHENSIVE METABOLIC PANEL
ALT: 14 U/L (ref 0–44)
AST: 13 U/L — ABNORMAL LOW (ref 15–41)
Albumin: 3.6 g/dL (ref 3.5–5.0)
Alkaline Phosphatase: 96 U/L (ref 38–126)
Anion gap: 10 (ref 5–15)
BUN: 22 mg/dL (ref 8–23)
CO2: 27 mmol/L (ref 22–32)
Calcium: 9.3 mg/dL (ref 8.9–10.3)
Chloride: 86 mmol/L — ABNORMAL LOW (ref 98–111)
Creatinine, Ser: 1.05 mg/dL — ABNORMAL HIGH (ref 0.44–1.00)
GFR, Estimated: >60 mL/min (ref >60)
Glucose, Bld: 833 mg/dL (ref 70–99)
Potassium: 4.5 mmol/L (ref 3.5–5.1)
Sodium: 123 mmol/L — ABNORMAL LOW (ref 135–145)
Total Bilirubin: 0.4 mg/dL (ref 0.3–1.2)
Total Protein: 6.9 g/dL (ref 6.5–8.1)

## 2019-12-26 LAB — BLOOD GAS, VENOUS
Acid-Base Excess: 2.8 mmol/L — ABNORMAL HIGH (ref 0.0–2.0)
Bicarbonate: 25.3 mmol/L (ref 20.0–28.0)
Drawn by: 1528
FIO2: 100
O2 Saturation: 51.7 %
Patient temperature: 37
pCO2, Ven: 51.4 mmHg (ref 44.0–60.0)
pH, Ven: 7.354 (ref 7.250–7.430)
pO2, Ven: 34.2 mmHg (ref 32.0–45.0)

## 2019-12-26 LAB — CBC
HCT: 37 % (ref 36.0–46.0)
HCT: 41.6 % (ref 36.0–46.0)
Hemoglobin: 12.6 g/dL (ref 12.0–15.0)
Hemoglobin: 13.9 g/dL (ref 12.0–15.0)
MCH: 30.9 pg (ref 26.0–34.0)
MCH: 31.1 pg (ref 26.0–34.0)
MCHC: 34.1 g/dL (ref 30.0–36.0)
MCHC: 33.4 g/dL (ref 30.0–36.0)
MCV: 90.7 fL (ref 80.0–100.0)
MCV: 93.1 fL (ref 80.0–100.0)
Platelets: 275 10*3/uL (ref 150–400)
Platelets: 271 10*3/uL (ref 150–400)
RBC: 4.08 MIL/uL (ref 3.87–5.11)
RBC: 4.47 MIL/uL (ref 3.87–5.11)
RDW: 13.5 % (ref 11.5–15.5)
RDW: 13.7 % (ref 11.5–15.5)
WBC: 9 10*3/uL (ref 4.0–10.5)
WBC: 9 10*3/uL (ref 4.0–10.5)
nRBC: 0 % (ref 0.0–0.2)
nRBC: 0 % (ref 0.0–0.2)

## 2019-12-26 LAB — CBG MONITORING, ED
Glucose-Capillary: 582 mg/dL (ref 70–99)
Glucose-Capillary: >600 mg/dL (ref 70–99)
Glucose-Capillary: 491 mg/dL — ABNORMAL HIGH (ref 70–99)
Glucose-Capillary: 494 mg/dL — ABNORMAL HIGH (ref 70–99)
Glucose-Capillary: 438 mg/dL — ABNORMAL HIGH (ref 70–99)
Glucose-Capillary: 275 mg/dL — ABNORMAL HIGH (ref 70–99)
Glucose-Capillary: 324 mg/dL — ABNORMAL HIGH (ref 70–99)
Glucose-Capillary: 270 mg/dL — ABNORMAL HIGH (ref 70–99)
Glucose-Capillary: 177 mg/dL — ABNORMAL HIGH (ref 70–99)
Glucose-Capillary: 157 mg/dL — ABNORMAL HIGH (ref 70–99)

## 2019-12-26 LAB — RESP PANEL BY RT-PCR (FLU A&B, COVID) ARPGX2
Influenza A by PCR: NEGATIVE
Influenza B by PCR: NEGATIVE
SARS Coronavirus 2 by RT PCR: NEGATIVE

## 2019-12-26 LAB — HEMOGLOBIN AND HEMATOCRIT, BLOOD
HCT: 41.7 % (ref 36.0–46.0)
Hemoglobin: 13.8 g/dL (ref 12.0–15.0)

## 2019-12-26 LAB — LACTIC ACID, PLASMA
Lactic Acid, Venous: 2.7 mmol/L (ref 0.5–1.9)
Lactic Acid, Venous: 2.6 mmol/L (ref 0.5–1.9)
Lactic Acid, Venous: 2.6 mmol/L (ref 0.5–1.9)

## 2019-12-26 LAB — GLUCOSE, CAPILLARY
Glucose-Capillary: 209 mg/dL — ABNORMAL HIGH (ref 70–99)
Glucose-Capillary: 275 mg/dL — ABNORMAL HIGH (ref 70–99)
Glucose-Capillary: 198 mg/dL — ABNORMAL HIGH (ref 70–99)

## 2019-12-26 LAB — SEDIMENTATION RATE: Sed Rate: 42 mm/hr — ABNORMAL HIGH (ref 0–22)

## 2019-12-26 LAB — OSMOLALITY: Osmolality: 328 mOsm/kg (ref 275–295)

## 2019-12-26 LAB — TYPE AND SCREEN
ABO/RH(D): A POS
Antibody Screen: NEGATIVE

## 2019-12-26 LAB — C-REACTIVE PROTEIN: CRP: 4.3 mg/dL — ABNORMAL HIGH (ref <1.0)

## 2019-12-26 LAB — BETA-HYDROXYBUTYRIC ACID: Beta-Hydroxybutyric Acid: 0.15 mmol/L (ref 0.05–0.27)

## 2019-12-26 LAB — POC OCCULT BLOOD, ED: Fecal Occult Bld: POSITIVE — AB

## 2019-12-26 MED ORDER — SODIUM CHLORIDE 0.9 % IV BOLUS
1000.0000 mL | Freq: Once | INTRAVENOUS | Status: AC
  Administered 2019-12-26: 1000 mL via INTRAVENOUS

## 2019-12-26 MED ORDER — POLYETHYLENE GLYCOL 3350 17 G PO PACK
17.0000 g | PACK | Freq: Every day | ORAL | Status: DC
  Administered 2019-12-26 – 2019-12-29 (×3): 17 g via ORAL
  Filled 2019-12-26 (×4): qty 1

## 2019-12-26 MED ORDER — ONDANSETRON HCL 4 MG/2ML IJ SOLN: 4.0000 mg | Freq: Four times a day (QID) | INTRAMUSCULAR | Status: DC | PRN

## 2019-12-26 MED ORDER — GABAPENTIN 400 MG PO CAPS
400.0000 mg | ORAL_CAPSULE | Freq: Three times a day (TID) | ORAL | Status: DC
  Administered 2019-12-26 – 2019-12-29 (×9): 400 mg via ORAL
  Filled 2019-12-26 (×9): qty 1

## 2019-12-26 MED ORDER — LACTATED RINGERS IV SOLN: INTRAVENOUS | Status: DC

## 2019-12-26 MED ORDER — PANTOPRAZOLE SODIUM 40 MG PO TBEC
40.0000 mg | DELAYED_RELEASE_TABLET | Freq: Two times a day (BID) | ORAL | Status: DC
  Administered 2019-12-26 – 2019-12-29 (×8): 40 mg via ORAL
  Filled 2019-12-26 (×8): qty 1

## 2019-12-26 MED ORDER — INSULIN REGULAR(HUMAN) IN NACL 100-0.9 UT/100ML-% IV SOLN
INTRAVENOUS | Status: DC
  Administered 2019-12-26: 14 [IU]/h via INTRAVENOUS
  Filled 2019-12-26: qty 100

## 2019-12-26 MED ORDER — LACTATED RINGERS IV BOLUS
20.0000 mL/kg | Freq: Once | INTRAVENOUS | Status: AC
  Administered 2019-12-26: 1634 mL via INTRAVENOUS

## 2019-12-26 MED ORDER — ENOXAPARIN SODIUM 40 MG/0.4ML ~~LOC~~ SOLN: 40.0000 mg | SUBCUTANEOUS | Status: DC

## 2019-12-26 MED ORDER — POTASSIUM CHLORIDE IN NACL 20-0.9 MEQ/L-% IV SOLN
INTRAVENOUS | Status: DC
  Filled 2019-12-26: qty 1000

## 2019-12-26 MED ORDER — ACETAMINOPHEN 325 MG PO TABS
650.0000 mg | ORAL_TABLET | Freq: Four times a day (QID) | ORAL | Status: DC | PRN
  Administered 2019-12-27: 650 mg via ORAL
  Filled 2019-12-26: qty 2

## 2019-12-26 MED ORDER — CLOPIDOGREL BISULFATE 75 MG PO TABS: 75.0000 mg | ORAL_TABLET | Freq: Every day | ORAL | Status: DC

## 2019-12-26 MED ORDER — INSULIN ASPART 100 UNIT/ML ~~LOC~~ SOLN
0.0000 [IU] | Freq: Three times a day (TID) | SUBCUTANEOUS | Status: DC
  Administered 2019-12-26: 5 [IU] via SUBCUTANEOUS
  Administered 2019-12-26: 8 [IU] via SUBCUTANEOUS
  Administered 2019-12-27 (×2): 3 [IU] via SUBCUTANEOUS
  Administered 2019-12-27: 5 [IU] via SUBCUTANEOUS
  Administered 2019-12-28 (×2): 8 [IU] via SUBCUTANEOUS
  Administered 2019-12-28 – 2019-12-29 (×2): 3 [IU] via SUBCUTANEOUS
  Administered 2019-12-29: 8 [IU] via SUBCUTANEOUS
  Administered 2019-12-29: 11 [IU] via SUBCUTANEOUS

## 2019-12-26 MED ORDER — ATORVASTATIN CALCIUM 40 MG PO TABS
40.0000 mg | ORAL_TABLET | Freq: Every day | ORAL | Status: DC
  Administered 2019-12-26 – 2019-12-29 (×4): 40 mg via ORAL
  Filled 2019-12-26 (×4): qty 1

## 2019-12-26 MED ORDER — HYDROCORTISONE ACETATE 25 MG RE SUPP
25.0000 mg | Freq: Two times a day (BID) | RECTAL | Status: DC
  Administered 2019-12-26 – 2019-12-29 (×7): 25 mg via RECTAL
  Filled 2019-12-26 (×7): qty 1

## 2019-12-26 MED ORDER — METOPROLOL TARTRATE 25 MG PO TABS
25.0000 mg | ORAL_TABLET | Freq: Two times a day (BID) | ORAL | Status: DC
  Administered 2019-12-26 – 2019-12-29 (×7): 25 mg via ORAL
  Filled 2019-12-26 (×7): qty 1

## 2019-12-26 MED ORDER — DEXTROSE IN LACTATED RINGERS 5 % IV SOLN: INTRAVENOUS | Status: DC

## 2019-12-26 MED ORDER — PANTOPRAZOLE SODIUM 40 MG IV SOLR: 40.0000 mg | Freq: Two times a day (BID) | INTRAVENOUS | Status: DC

## 2019-12-26 MED ORDER — SODIUM CHLORIDE 0.9 % IV SOLN
8.0000 mg/h | INTRAVENOUS | Status: DC
  Filled 2019-12-26: qty 80

## 2019-12-26 MED ORDER — SODIUM CHLORIDE 0.9 % IV SOLN
80.0000 mg | Freq: Once | INTRAVENOUS | Status: DC
  Administered 2019-12-26: 80 mg via INTRAVENOUS
  Filled 2019-12-26: qty 80

## 2019-12-26 MED ORDER — VITAMIN B-12 100 MCG PO TABS
500.0000 ug | ORAL_TABLET | Freq: Every day | ORAL | Status: DC
  Administered 2019-12-26 – 2019-12-29 (×4): 500 ug via ORAL
  Filled 2019-12-26 (×4): qty 5

## 2019-12-26 MED ORDER — FENOFIBRATE 160 MG PO TABS
160.0000 mg | ORAL_TABLET | Freq: Every day | ORAL | Status: DC
  Administered 2019-12-26 – 2019-12-29 (×4): 160 mg via ORAL
  Filled 2019-12-26 (×6): qty 1

## 2019-12-26 MED ORDER — PEG 3350-KCL-NA BICARB-NACL 420 G PO SOLR
4000.0000 mL | Freq: Once | ORAL | Status: AC
  Administered 2019-12-26: 4000 mL via ORAL

## 2019-12-26 MED ORDER — INSULIN GLARGINE 100 UNIT/ML ~~LOC~~ SOLN
20.0000 [IU] | Freq: Every day | SUBCUTANEOUS | Status: DC
  Administered 2019-12-26 – 2019-12-28 (×3): 20 [IU] via SUBCUTANEOUS
  Filled 2019-12-26 (×5): qty 0.2

## 2019-12-26 MED ORDER — POTASSIUM CHLORIDE 10 MEQ/100ML IV SOLN
10.0000 meq | INTRAVENOUS | Status: AC
Start: 2019-12-26 — End: 2019-12-26
  Administered 2019-12-26 (×2): 10 meq via INTRAVENOUS
  Filled 2019-12-26 (×2): qty 100

## 2019-12-26 MED ORDER — CHLORHEXIDINE GLUCONATE CLOTH 2 % EX PADS
6.0000 | MEDICATED_PAD | Freq: Every day | CUTANEOUS | Status: DC
  Administered 2019-12-26 – 2019-12-29 (×4): 6 via TOPICAL

## 2019-12-26 MED ORDER — DEXTROSE 50 % IV SOLN: 0.0000 mL | INTRAVENOUS | Status: DC | PRN

## 2019-12-26 MED ORDER — INSULIN REGULAR(HUMAN) IN NACL 100-0.9 UT/100ML-% IV SOLN: INTRAVENOUS | Status: DC

## 2019-12-26 MED ORDER — IOHEXOL 300 MG/ML  SOLN
100.0000 mL | Freq: Once | INTRAMUSCULAR | Status: AC | PRN
  Administered 2019-12-26: 100 mL via INTRAVENOUS

## 2019-12-26 MED ORDER — SERTRALINE HCL 50 MG PO TABS
100.0000 mg | ORAL_TABLET | Freq: Every day | ORAL | Status: DC
  Administered 2019-12-26 – 2019-12-29 (×4): 100 mg via ORAL
  Filled 2019-12-26 (×4): qty 2

## 2019-12-26 MED ORDER — INSULIN ASPART 100 UNIT/ML ~~LOC~~ SOLN
0.0000 [IU] | Freq: Every day | SUBCUTANEOUS | Status: DC
  Administered 2019-12-27 – 2019-12-28 (×2): 2 [IU] via SUBCUTANEOUS

## 2019-12-26 NOTE — Progress Notes (Addendum)
Inpatient Diabetes Program Recommendations  AACE/ADA: New Consensus Statement on Inpatient Glycemic Control   Target Ranges:  Prepandial:   less than 140 mg/dL      Peak postprandial:   less than 180 mg/dL (1-2 hours)      Critically ill patients:  140 - 180 mg/dL  Results for Pamela Knight, Pamela Knight (MRN [id number]) as of 12/26/2019 06:46  Ref. Range 12/25/2019 23:16 12/26/2019 01:46 12/26/2019 02:23 12/26/2019 03:00 12/26/2019 03:29 12/26/2019 03:59 12/26/2019 04:36 12/26/2019 05:28 12/26/2019 06:24  Glucose-Capillary Latest Ref Range: 70 - 99 mg/dL >600 (HH) >600 (HH) 582 (HH) 494 (H) 491 (H) 438 (H) 324 (H) 275 (H) 270 (H)   Results for Pamela Knight, Pamela Knight (MRN [id number]) as of 12/26/2019 06:46  Ref. Range 12/26/2019 00:58  Glucose Latest Ref Range: 70 - 99 mg/dL [facility])  Results for Pamela Knight, Pamela Knight (MRN [id number]) as of 12/26/2019 06:46  Ref. Range 11/15/2019 20:00  Hemoglobin A1C Latest Ref Range: 4.8 - 5.6 % 14.7 (H)   Review of Glycemic Control  Diabetes history: DM2 Outpatient Diabetes medications: Lantus 60 units QHS, Novolo 3 units TID with meals, Novolog 0-15 units, Metformin 500 mg BID Current orders for Inpatient glycemic control: IV insulin for HHS  Inpatient Diabetes Program Recommendations:    Insulin: Once MD is ready to transition from IV to SQ insulin, please consider ordering Lantus 40 units Q24H, CBGs Q4H, Novolog 0-15 units Q4H, and Novolog 5 units TID with meals for meal coverage if patient eats at least 50% of meals.  NOTE: Noted consult for Diabetes Coordinator. Chart reviewed. Noted patient was inpatient 11/15/19 to 11/26/19 at [facility] with osteomyelitis of left great toe. Diabetes coordinator spoke with pat on 11/24/19 regarding A1C of 14/7%, DM control, and issue regarding insulin. Per note on 11/24/19 by K. Hassell Done, RN, Diabetes Coordinator, patient was living at a hotel prior to that admission and was trying to find some other place to live. Patient  reported she was going to receive money from family members to help pay for her insulins. Patient has [place] and reported she was going to apply for [facility]. Per discharge summary on 11/25/19, patient was offered bed at [facility] but refused to go to SNF in [place]. She wanted to go to a hotel and reported that she had insulin in the refrigerator at the hotel. Will consult TOC to find out if patient can get insulin refilled in [place] for low cost of $4 or if she will need to take prescriptions to [place] to get them filled.  Addendum 12/26/19@12 :40-Attempted to call patient's room to speak with her over the phone but no answer. Noted TOC note today which indicates that patient has reported that her friend could pick up insulins at [facility] in [place], [place].  Thanks,  Barnie Alderman, RN, MSN, CDE Diabetes Coordinator Inpatient Diabetes Program [phone] (Team Pager from 8am to 5pm)

## 2019-12-26 NOTE — ED Notes (Signed)
Date and time results received: 12/26/19 0137 (use smartphrase ".now" to insert current time)  Test: glucose Critical Value: 833  Name of Provider Notified: Dr Tomi Bamberger  Orders Received? Or Actions Taken?: Actions Taken: no orders received

## 2019-12-26 NOTE — ED Provider Notes (Signed)
[facility] EMERGENCY DEPARTMENT Provider Note   CSN: [id number] Arrival date & time: 12/25/19  2314   Time seen 11:20 PM  History Chief Complaint  Patient presents with  . Hyperglycemia    Pamela Knight is a 59 y.o. female.  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  She states she has not taken her insulin since she was discharged from the hospital.  She states she is taking her Metformin.  She does not check her blood sugars because she has no way to do it.  She states the past few days she is felt dizzy and has been having thirst and urinary frequency.  She states she drank a whole container of orange juice today.  She denies nausea, vomiting, diarrhea but does states she is constipated.  Patient is currently homeless and living with a friend.  PCP Patient, No Pcp Per   Past Medical History:  Diagnosis Date  . Diabetes mellitus, type II ([place])   . Heart attack ([place])   . Heart failure ([place])   . Hyperlipidemia   . Hypertension   . Kidney disease     Patient Active Problem List   Diagnosis Date Noted  . Hyperosmolar hyperglycemic state (HHS) ([place]) 12/26/2019  . Malnutrition of moderate degree 11/19/2019  . Osteomyelitis ([place]) 11/15/2019  . Personal history of noncompliance with medical treatment, presenting hazards to health 12/26/2015  . Uncontrolled type 2 diabetes mellitus with stage 2 chronic kidney disease, with long-term current use of insulin ([place]) 12/18/2015  . Essential hypertension, benign 12/18/2015  . Hypercholesteremia 12/18/2015  . DM type 2 causing vascular disease ([place]) 12/18/2015    Past Surgical History:  Procedure Laterality Date  . AMPUTATION Left 11/17/2019   Procedure: AMPUTATION LEFT GREAT TOE;  Surgeon: Erle Crocker, MD;  Location: [facility];  Service: Orthopedics;  Laterality: Left;  . TONSILLECTOMY       OB History   No obstetric history  on file.     Family History  Problem Relation Age of Onset  . Diabetes Mother   . Hypertension Mother   . Heart failure Mother   . Kidney disease Mother   . Heart attack Mother   . Diabetes Father   . Hypertension Father   . Heart failure Father   . Kidney disease Father   . Heart attack Father   . Diabetes Sister   . Hypertension Sister   . Heart failure Sister   . Kidney disease Sister   . Heart attack Sister   . Diabetes Brother   . Hypertension Brother   . Heart failure Brother   . Kidney disease Brother   . Heart attack Brother     Social History   Tobacco Use  . Smoking status: Former Research scientist (life sciences)  . Smokeless tobacco: Never Used  Substance Use Topics  . Alcohol use: No  . Drug use: No    Home Medications Prior to Admission medications   Medication Sig Start Date End Date Taking? Authorizing Provider  aspirin EC 81 MG tablet Take 1 tablet (81 mg total) by mouth daily. 11/25/19   Hosie Poisson, MD  atorvastatin (LIPITOR) 40 MG tablet Take 1 tablet (40 mg total) by mouth daily. 11/25/19   Hosie Poisson, MD  clopidogrel (PLAVIX) 75 MG tablet Take 1 tablet (75 mg total) by mouth daily. 11/25/19   Hosie Poisson, MD  fenofibrate ([place])  145 MG tablet Take 1 tablet (145 mg total) by mouth daily. 11/25/19   Hosie Poisson, MD  gabapentin (NEURONTIN) 800 MG tablet Take 1 tablet (800 mg total) by mouth 3 (three) times daily. 11/25/19   Hosie Poisson, MD  hydrocortisone (ANUSOL-HC) 2.5 % rectal cream Place rectally 4 (four) times daily. 11/23/19   Hosie Poisson, MD  insulin aspart (NOVOLOG) 100 UNIT/ML injection Inject 3 Units into the skin 3 (three) times daily with meals. 11/23/19   Hosie Poisson, MD  insulin aspart (NOVOLOG) 100 UNIT/ML injection CBG 70 - 120: 0 units CBG 121 - 150: 2 units CBG 151 - 200: 3 units CBG 201 - 250: 5 units CBG 251 - 300: 8 units CBG 301 - 350: 11 units CBG 351 - 400: 15 units 11/23/19   Hosie Poisson, MD  insulin glargine (LANTUS) 100  UNIT/ML injection Inject 0.6 mLs (60 Units total) into the skin at bedtime. 10/25/19   Veryl Speak, MD  lamoTRIgine (LAMICTAL) 100 MG tablet Take 1 tablet (100 mg total) by mouth 2 (two) times daily. 11/25/19   Hosie Poisson, MD  metFORMIN (GLUCOPHAGE) 500 MG tablet Take 1 tablet (500 mg total) by mouth 2 (two) times daily with a meal. 11/25/19   Hosie Poisson, MD  metoprolol tartrate (LOPRESSOR) 50 MG tablet Take 1 tablet (50 mg total) by mouth daily. 11/25/19   Hosie Poisson, MD  Multiple Vitamins-Minerals (CENTRUM SILVER PO) Take by mouth.    [provider]  sertraline (ZOLOFT) 100 MG tablet Take 1 tablet (100 mg total) by mouth in the morning and at bedtime. 11/25/19   Hosie Poisson, MD  vitamin B-12 (CYANOCOBALAMIN) 1000 MCG tablet Take 1,000 mcg by mouth daily.    [provider]    Allergies    Avandia [rosiglitazone] and Niacin and related  Review of Systems   Review of Systems  All other systems reviewed and are negative.   Physical Exam Updated Vital Signs BP (!) 152/60   Pulse 65   Temp 98.5 F (36.9 C) (Oral)   Resp 16   Ht 5\' 2"  (1.575 m)   Wt 81.7 kg   SpO2 99%   BMI 32.94 kg/m   Physical Exam Vitals and nursing note reviewed.  Constitutional:      General: She is not in acute distress.    Appearance: Normal appearance. She is obese. She is not ill-appearing or toxic-appearing.  HENT:     Head: Normocephalic and atraumatic.     Right Ear: External ear normal.     Left Ear: External ear normal.     Mouth/Throat:     Mouth: Mucous membranes are dry.  Eyes:     Extraocular Movements: Extraocular movements intact.     Conjunctiva/sclera: Conjunctivae normal.     Pupils: Pupils are equal, round, and reactive to light.  Cardiovascular:     Rate and Rhythm: Normal rate and regular rhythm.     Pulses: Normal pulses.     Heart sounds: Normal heart sounds.  Pulmonary:     Effort: Pulmonary effort is normal. No respiratory distress.      Breath sounds: Normal breath sounds.  Abdominal:     General: Abdomen is flat.     Palpations: Abdomen is soft.     Tenderness: There is no abdominal tenderness.  Musculoskeletal:        General: Normal range of motion.     Cervical back: Normal range of motion.     Comments: The left  foot shows her left great toe is gone, the area where the toe was is enlarged and slightly red but it is not hot to touch.  It does not appear to be overtly infected.  Skin:    General: Skin is warm and dry.  Neurological:     General: No focal deficit present.     Mental Status: She is alert and oriented to person, place, and time.     Cranial Nerves: No cranial nerve deficit.  Psychiatric:        Mood and Affect: Mood normal.        Behavior: Behavior normal.        Thought Content: Thought content normal.       ED Results / Procedures / Treatments   Labs (all labs ordered are listed, but only abnormal results are displayed) Results for orders placed or performed during the hospital encounter of 12/25/19  Resp Panel by RT-PCR (Flu A&B, Covid) Nasopharyngeal Swab   Specimen: Nasopharyngeal Swab; Nasopharyngeal(NP) swabs in vial transport medium  Result Value Ref Range   SARS Coronavirus 2 by RT PCR NEGATIVE NEGATIVE   Influenza A by PCR NEGATIVE NEGATIVE   Influenza B by PCR NEGATIVE NEGATIVE  CBC  Result Value Ref Range   WBC 9.0 4.0 - 10.5 K/uL   RBC 4.08 3.87 - 5.11 MIL/uL   Hemoglobin 12.6 12.0 - 15.0 g/dL   HCT 37.0 36.0 - 46.0 %   MCV 90.7 80.0 - 100.0 fL   MCH 30.9 26.0 - 34.0 pg   MCHC 34.1 30.0 - 36.0 g/dL   RDW 13.5 11.5 - 15.5 %   Platelets 275 150 - 400 K/uL   nRBC 0.0 0.0 - 0.2 %  Lactic acid, plasma  Result Value Ref Range   Lactic Acid, Venous 2.7 (HH) 0.5 - 1.9 mmol/L  Comprehensive metabolic panel  Result Value Ref Range   Sodium 123 (L) 135 - 145 mmol/L   Potassium 4.5 3.5 - 5.1 mmol/L   Chloride 86 (L) 98 - 111 mmol/L   CO2 27 22 - 32 mmol/L   Glucose, Bld 833  (HH) 70 - 99 mg/dL   BUN 22 8 - 23 mg/dL   Creatinine, Ser 1.05 (H) 0.44 - 1.00 mg/dL   Calcium 9.3 8.9 - 10.3 mg/dL   Total Protein 6.9 6.5 - 8.1 g/dL   Albumin 3.6 3.5 - 5.0 g/dL   AST 13 (L) 15 - 41 U/L   ALT 14 0 - 44 U/L   Alkaline Phosphatase 96 38 - 126 U/L   Total Bilirubin 0.4 0.3 - 1.2 mg/dL   GFR, Estimated >60 >60 mL/min   Anion gap 10 5 - 15  Sedimentation rate  Result Value Ref Range   Sed Rate 42 (H) 0 - 22 mm/hr  C-reactive protein  Result Value Ref Range   CRP 4.3 (H) <1.0 mg/dL  Beta-hydroxybutyric acid  Result Value Ref Range   Beta-Hydroxybutyric Acid 0.15 0.05 - 0.27 mmol/L  CBG monitoring, ED  Result Value Ref Range   Glucose-Capillary >600 (HH) 70 - 99 mg/dL  CBG monitoring, ED  Result Value Ref Range   Glucose-Capillary >600 (HH) 70 - 99 mg/dL  CBG monitoring, ED  Result Value Ref Range   Glucose-Capillary 582 (HH) 70 - 99 mg/dL   Comment 1 Notify RN   CBG monitoring, ED  Result Value Ref Range   Glucose-Capillary 494 (H) 70 - 99 mg/dL   Laboratory interpretation all normal except hyperglycemia  without metabolic acidosis, mild lactic acidosis, mild elevation of CRP, corrected sodium for glucose is 135-141, mildly elevated sed rate    EKG EKG Interpretation  Date/Time:  Monday December 25 2019 23:19:28 EST Ventricular Rate:  73 PR Interval:    QRS Duration: 83 QT Interval:  377 QTC Calculation: 416 R Axis:   49 Text Interpretation: Sinus rhythm Probable left atrial enlargement Minimal ST elevation, anterior leads No significant change since last tracing 15 Nov 2019 Confirmed by Rolland Porter [phone]) on 12/26/2019 12:35:29 AM   Radiology DG Foot Complete Left  Result Date: 12/26/2019 CLINICAL DATA:  Status post left great toe amputation and subsequent swelling in the area. EXAM: LEFT FOOT - COMPLETE 3+ VIEW COMPARISON:  November 15, 2019 FINDINGS: There is prior surgical amputation of the left great toe. There is no evidence of acute fracture  or dislocation. There is no evidence of significant arthropathy. A small plantar calcaneal spur is noted. Mild to moderate severity soft tissue swelling is seen along the site of prior left great toe amputation no soft tissue air is seen. IMPRESSION: 1. Prior surgical amputation of the left great toe with soft tissue swelling seen at the surgical site. Electronically Signed   By: Virgina Norfolk M.D.   On: 12/26/2019 01:20    Procedures .Critical Care Performed by: Rolland Porter, MD Authorized by: Rolland Porter, MD   Critical care provider statement:    Critical care time (minutes):  40   Critical care was necessary to treat or prevent imminent or life-threatening deterioration of the following conditions:  Endocrine crisis   Critical care was time spent personally by me on the following activities:  Discussions with consultants, examination of patient, obtaining history from patient or surrogate, ordering and review of laboratory studies, ordering and review of radiographic studies, re-evaluation of patient's condition and review of old charts   (including critical care time)  Medications Ordered in ED Medications  insulin regular, human (MYXREDLIN) 100 units/ 100 mL infusion (11 Units/hr Intravenous Rate/Dose Change 12/26/19 0301)  lactated ringers infusion ( Intravenous New Bag/Given 12/26/19 0258)  dextrose 5 % in lactated ringers infusion (has no administration in time range)  dextrose 50 % solution 0-50 mL (has no administration in time range)  sodium chloride 0.9 % bolus 1,000 mL (0 mLs Intravenous Stopped 12/26/19 0201)  sodium chloride 0.9 % bolus 1,000 mL (0 mLs Intravenous Stopped 12/26/19 0257)    ED Course  I have reviewed the triage vital signs and the nursing notes.  Pertinent labs & imaging results that were available during my care of the patient were reviewed by me and considered in my medical decision making (see chart for details).    MDM Rules/Calculators/A&P                          X-ray was obtained of her left foot due to the amount of swelling and redness around her amputation.  Lab work was done to evaluate her hyperglycemia, specifically to make sure she is not in DKA which is doubtful because she is not having chest pain, shortness of breath, nausea or vomiting.  Sed rate and CRP was done as a screen for residual osteomyelitis.   1:35 AM patient's blood glucose is come back 833 with normal anion gap.  She was started on an insulin drip per protocol.  2:25 AM patient's repeat CBG is 582 on the insulin drip. I'm going to talk to the hospitalist  about her foot, I cannot tell whether there is something going on there or if this is just normal healing after her amputation. Her glucose is coming down well with IV fluids and the insulin drip.  2:55 AM Dr. Josephine Cables, hospitalist we discussed the patient I was concerned about her foot and asked him to look at her foot for an opinion however he feels like she needs to be admitted for her diabetes.  3:00 AM patient CBG is now 494.   Final Clinical Impression(s) / ED Diagnoses Final diagnoses:  Hyperglycemia    Rx / DC Orders  Plan admission  Rolland Porter, MD, Barbette Or, MD 12/26/19 [phone]

## 2019-12-26 NOTE — ED Notes (Signed)
Date and time results received: 12/26/19 0138 (use smartphrase ".now" to insert current time)  Test: lactic acid Critical Value: 2.7  Name of Provider Notified: dr knapp  Orders Received? Or Actions Taken?: Actions Taken: no orders

## 2019-12-26 NOTE — TOC Initial Note (Signed)
Transition of Care [facility]) - Initial/Assessment Note   Patient Details  Name: Pamela Knight MRN: [id number] Date of Birth: July 23, 1960  Transition of Care [facility]) CM/SW Contact:    Sherie Don, LCSW Phone Number: 12/26/2019, 11:24 AM  Clinical Narrative: Patient is a 59 year old female who is under observation for hyperosmolar hyperglycemic state. Patient has a history of type 2 DM, hypertension, hypercholesteremia, and osteomyelitis. TOC consulted for assistance with patient's medication as she has [place] Medicaid, but is currently homeless and living with her friend in [place]. Per [facility] supervisor, patient will need to pick up her medications in [place] as that is where she has her Medicaid. CSW met with patient and patient stated her friend, Pamela Knight, will be able to help her pick up her medications at the [facility] located at [address], [place] [address]. CSW made multiple attempts to call Ms. Tamala Julian to verify she can assist, but was unable to reach her and voicemail is not set up. TOC to continue attempting to contact Ms. Smith.  Expected Discharge Plan: Home/Self Care Barriers to Discharge: Continued Medical Work up  Patient Goals and CMS Choice Patient states their goals for this hospitalization and ongoing recovery are:: Pick up medications CMS Medicare.gov Compare Post Acute Care list provided to:: Patient Choice offered to / list presented to : Patient  Expected Discharge Plan and Services Expected Discharge Plan: Home/Self Care In-house Referral: Clinical Social Work Post Acute Care Choice: NA Living arrangements for the past 2 months: Homeless (Currently living with her friend, Pamela Knight)              DME Arranged: N/A DME Agency: NA HH Arranged: NA [place] Agency: NA  Prior Living Arrangements/Services Living arrangements for the past 2 months: Homeless (Currently living with her friend, Pamela Knight) Lives with:: Friends Patient language and need for  interpreter reviewed:: Yes Do you feel safe going back to the place where you live?: Yes      Need for Family Participation in Patient Care: Yes (Comment) Care giver support system in place?: Yes (comment) Criminal Activity/Legal Involvement Pertinent to Current Situation/Hospitalization: No - Comment as needed  Activities of Daily Living Home Assistive Devices/Equipment: None ADL Screening (condition at time of admission) Patient's cognitive ability adequate to safely complete daily activities?: Yes Is the patient deaf or have difficulty hearing?: No Does the patient have difficulty seeing, even when wearing glasses/contacts?: No Does the patient have difficulty concentrating, remembering, or making decisions?: No Patient able to express need for assistance with ADLs?: Yes Does the patient have difficulty dressing or bathing?: No Independently performs ADLs?: Yes (appropriate for developmental age) Does the patient have difficulty walking or climbing stairs?: No Weakness of Legs: None Weakness of Arms/Hands: None  Permission Sought/Granted Permission sought to share information with : Family Supports Permission granted to share information with : Yes, Verbal Permission Granted  Share Information with NAME: Pamela Knight Permission granted to share info w Relationship: Friend Permission granted to share info w Contact Information: [phone]  Emotional Assessment Attitude/Demeanor/Rapport: Lethargic Affect (typically observed): Accepting Orientation: : Oriented to Self,Oriented to Place,Oriented to  Time,Oriented to Situation Alcohol / Substance Use: Not Applicable Psych Involvement: No (comment)  Admission diagnosis:  Hyperglycemia [R73.9] Hyperosmolar hyperglycemic state (HHS) ([place]) [E11.00, E11.65] Patient Active Problem List   Diagnosis Date Noted  . Hyperosmolar hyperglycemic state (HHS) ([place]) 12/26/2019  . Hyperglycemia due to diabetes mellitus ([place]) 12/26/2019  .  Lactic acidosis 12/26/2019  .  Dehydration 12/26/2019  . Pseudohyponatremia 12/26/2019  . Uncontrolled type 2 diabetes mellitus with hyperglycemia, with long-term current use of insulin ([place]) 12/26/2019  . Heme positive stool 12/26/2019  . Hyponatremia 12/26/2019  . Hyperglycemia   . Constipation   . RLQ abdominal pain   . Malnutrition of moderate degree 11/19/2019  . Osteomyelitis ([place]) 11/15/2019  . Personal history of noncompliance with medical treatment, presenting hazards to health 12/26/2015  . Uncontrolled type 2 diabetes mellitus with stage 2 chronic kidney disease, with long-term current use of insulin ([place]) 12/18/2015  . Essential hypertension, benign 12/18/2015  . Hypercholesteremia 12/18/2015  . DM type 2 causing vascular disease ([place]) 12/18/2015   PCP:  Patient, No Pcp Per Pharmacy:   [id number] [id number]  [id number]: [phone]: [phone] Readmission Risk Interventions No flowsheet data found.

## 2019-12-26 NOTE — ED Notes (Signed)
CRITICAL VALUE ALERT  Critical Value:  Serum Osmolality 328  Date & Time Notied:  12/26/2019 0900  Provider Notified: Dr. Carles Collet  Orders Received/Actions taken: see chart

## 2019-12-26 NOTE — ED Notes (Signed)
Date and time results received: 12/26/19 0829  Test: Glucose Critical Value: 837   Name of Provider Notified: Tat  Orders Received? Or Actions Taken?: Treatment in process

## 2019-12-26 NOTE — Progress Notes (Signed)
6:13AM GI bleed RN called due to incidental finding of patient's occult blood being positive.  H/H on admission was 12.6/7.0, this will be repeated with plan to transfuse blood based on findings. Patient was stable and was in no acute distress Type and crossmatch will be done Continue IV Protonix drip Gastroenterologist will be consulted in the morning

## 2019-12-26 NOTE — Progress Notes (Signed)
Responded to nursing call:  Joana Reamer, RN called to report patient is "very lethargic" and hypothermic.  CBG 209.  Ms. Pamela Knight stated patient was alert and oriented in ED.   Subjective: I came to bedside to evaluate patient around 1118AM.  Patient aroused to my voice when I called her name.   I asked patient if she was hungry to which she responded "yes".  I asked her what her favorite food was to which patient responded "chicken" Patient is oriented to place and name.  She responds "December 2021" when I asked her the date.  She was not able to tell me the exact date. She denied cp, sob, abd pain, headache  Vitals:   12/26/19 0846 12/26/19 1015 12/26/19 1022 12/26/19 1113  BP: (!) 111/95  140/85 (!) 145/82  Pulse: 64  82 80  Resp: 17  16   Temp: 97.8 F (36.6 C)  97.7 F (36.5 C) 97.7 F (36.5 C)  TempSrc: Oral  Rectal Rectal  SpO2: 95%  96%   Weight:  81.5 kg    Height:  5\' 2"  (1.575 m)     CV--RRR Lung--CTA Abd--soft+BS/NT   Assessment/Plan: NKHS- -transitioned to [place] insulin -continue IVF  Mental Status -unchanged from my evaluation from earlier today in ED -continue current treatment plan -PT eval -patient states "I haven't slept in 2 days" -patient spoke with Tobi Bastos, LCSW and answered all questions appropriately and was able to provide the correct address to the [employer] in [place], [place], DO [facility]

## 2019-12-26 NOTE — H&P (Addendum)
History and Physical  Pamela Knight BTD:[id number] DOB: 01-23-1960 DOA: 12/25/2019  Referring physician: Rolland Porter, MD  PCP: Patient, No Pcp Per  Patient coming from: Home  Chief Complaint: Excessive thirst and urinary frequency   HPI: Pamela Knight is a 58 y.o. female with medical history significant for coronary artery disease with history of CABG per patient, CHF, DM 2, hypertension, hyperlipidemia, depression, medication noncompliance who presents to the ED due to excessive thirst and increased urinary frequency. 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 She states that she has not been taking her insulin since she was discharged from the hospital, though she has been taking Metformin.  She has not been checking blood glucose level at home because she states that she does not have any means of checking it, she complained of some dizziness, excessive thirst and increased urinary frequency within last few days, so she decided to go to the ED for further evaluation and management. Patient states that she is currently staying with a friend, she denies chest pain, shortness of breath, fever, chills, nausea, vomiting or abdominal pain.  ED Course: In the emergency department, she was hemodynamically stable.  Work-up in the ED showed hyperglycemia, hyponatremia, lactic acid 2.7, beta hydroxybutyrate  0.15, CRP 4.3, sed rate 42.  Left foot x-ray showed prior surgical limitation of the left great toe with soft tissue swelling seen at the surgical site.  She was started on IV insulin drip per [facility]HS Endo tool.  Hospitalist was asked to admit patient for further evaluation and management.  Review of Systems: Constitutional: Negative for chills and fever.   HENT: Negative for ear pain and sore throat.   Eyes: Negative for pain and visual disturbance.  Respiratory: Negative for cough, chest tightness and shortness of breath.   Cardiovascular: Negative for chest pain and palpitations.  Gastrointestinal: Negative for abdominal pain and vomiting.  Endocrine: Positive for increased thirst and urinary frequency.  Negative for polyphagia   Genitourinary: Negative for decreased urine volume, dysuria, enuresis, hematuria Musculoskeletal: Negative for arthralgias and back pain.  Skin: Negative for color change and rash.  Allergic/Immunologic: Negative for immunocompromised state.  Neurological: Positive for occasional dizziness.  Negative for tremors, syncope, speech difficulty, weakness Hematological: Does not bruise/bleed easily.  All other systems reviewed and are negative   Past Medical History:  Diagnosis Date  . Diabetes mellitus, type II ([place])   . Heart attack ([place])   . Heart failure ([place])   . Hyperlipidemia   . Hypertension   . Kidney disease    Past Surgical History:  Procedure Laterality Date  . AMPUTATION Left 11/17/2019   Procedure: AMPUTATION LEFT GREAT TOE;  Surgeon: Erle Crocker, MD;  Location: [facility];  Service: Orthopedics;  Laterality: Left;  . TONSILLECTOMY      Social History:  reports that she has quit smoking. She has never used smokeless tobacco. She reports that she does not drink alcohol and does not use drugs.   Allergies  Allergen Reactions  . Avandia [Rosiglitazone] Other (See Comments)    unk  . Niacin And Related Other (See Comments)    unk    Family History  Problem Relation Age of Onset  . Diabetes Mother   . Hypertension Mother   . Heart failure Mother   .  Kidney disease Mother   . Heart attack Mother   . Diabetes Father   . Hypertension Father   . Heart failure Father   . Kidney disease Father   . Heart attack Father   . Diabetes Sister   . Hypertension Sister   . Heart failure  Sister   . Kidney disease Sister   . Heart attack Sister   . Diabetes Brother   . Hypertension Brother   . Heart failure Brother   . Kidney disease Brother   . Heart attack Brother     Prior to Admission medications   Medication Sig Start Date End Date Taking? Authorizing Provider  aspirin EC 81 MG tablet Take 1 tablet (81 mg total) by mouth daily. 11/25/19   Hosie Poisson, MD  atorvastatin (LIPITOR) 40 MG tablet Take 1 tablet (40 mg total) by mouth daily. 11/25/19   Hosie Poisson, MD  clopidogrel (PLAVIX) 75 MG tablet Take 1 tablet (75 mg total) by mouth daily. 11/25/19   Hosie Poisson, MD  fenofibrate (TRICOR) 145 MG tablet Take 1 tablet (145 mg total) by mouth daily. 11/25/19   Hosie Poisson, MD  gabapentin (NEURONTIN) 800 MG tablet Take 1 tablet (800 mg total) by mouth 3 (three) times daily. 11/25/19   Hosie Poisson, MD  hydrocortisone (ANUSOL-HC) 2.5 % rectal cream Place rectally 4 (four) times daily. 11/23/19   Hosie Poisson, MD  insulin aspart (NOVOLOG) 100 UNIT/ML injection Inject 3 Units into the skin 3 (three) times daily with meals. 11/23/19   Hosie Poisson, MD  insulin aspart (NOVOLOG) 100 UNIT/ML injection CBG 70 - 120: 0 units CBG 121 - 150: 2 units CBG 151 - 200: 3 units CBG 201 - 250: 5 units CBG 251 - 300: 8 units CBG 301 - 350: 11 units CBG 351 - 400: 15 units 11/23/19   Hosie Poisson, MD  insulin glargine (LANTUS) 100 UNIT/ML injection Inject 0.6 mLs (60 Units total) into the skin at bedtime. 10/25/19   Veryl Speak, MD  lamoTRIgine (LAMICTAL) 100 MG tablet Take 1 tablet (100 mg total) by mouth 2 (two) times daily. 11/25/19   Hosie Poisson, MD  metFORMIN (GLUCOPHAGE) 500 MG tablet Take 1 tablet (500 mg total) by mouth 2 (two) times daily with a meal. 11/25/19   Hosie Poisson, MD  metoprolol tartrate (LOPRESSOR) 50 MG tablet Take 1 tablet (50 mg total) by mouth daily. 11/25/19   Hosie Poisson, MD  Multiple Vitamins-Minerals (CENTRUM SILVER PO) Take by mouth.     [provider]  sertraline (ZOLOFT) 100 MG tablet Take 1 tablet (100 mg total) by mouth in the morning and at bedtime. 11/25/19   Hosie Poisson, MD  vitamin B-12 (CYANOCOBALAMIN) 1000 MCG tablet Take 1,000 mcg by mouth daily.    [provider]    Physical Exam: BP (!) 152/60   Pulse 65   Temp 98.5 F (36.9 C) (Oral)   Resp 16   Ht 5\' 2"  (1.575 m)   Wt 81.7 kg   SpO2 99%   BMI 32.94 kg/m   . General: 59 y.o. year-old female obese in no acute distress.  Alert and oriented x3. Marland Kitchen HEENT: Dry mucous membrane.  NCAT, EOMI . Neck: Supple, trachea medial . Cardiovascular: Regular rate and rhythm with no rubs or gallops.  No thyromegaly or JVD noted.  No lower extremity edema. 2/4 pulses in all 4 extremities. Marland Kitchen Respiratory: Clear to auscultation with no wheezes or rales. Good inspiratory effort. . Abdomen: Soft nontender nondistended  with normal bowel sounds x4 quadrants. . Muskuloskeletal: Left great toe amputation with soft tissue swelling at surgical site but without apparent infection of the area.  No cyanosis, clubbing or edema noted bilaterally . Neuro: CN II-XII intact, strength, sensation, reflexes . Skin: No ulcerative lesions noted or rashes . Psychiatry: Judgement and insight appear normal. Mood is appropriate for condition and setting          Labs on Admission:  Basic Metabolic Panel: Recent Labs  Lab 12/26/19 0058  NA 123*  K 4.5  CL 86*  CO2 27  GLUCOSE 833*  BUN 22  CREATININE 1.05*  CALCIUM 9.3   Liver Function Tests: Recent Labs  Lab 12/26/19 0058  AST 13*  ALT 14  ALKPHOS 96  BILITOT 0.4  PROT 6.9  ALBUMIN 3.6   No results for input(s): LIPASE, AMYLASE in the last 168 hours. No results for input(s): AMMONIA in the last 168 hours. CBC: Recent Labs  Lab 12/26/19 0058  WBC 9.0  HGB 12.6  HCT 37.0  MCV 90.7  PLT 275   Cardiac Enzymes: No results for input(s): CKTOTAL, CKMB, CKMBINDEX, TROPONINI in the last 168 hours.  BNP  (last 3 results) No results for input(s): BNP in the last 8760 hours.  ProBNP (last 3 results) No results for input(s): PROBNP in the last 8760 hours.  CBG: Recent Labs  Lab 12/25/19 2316 12/26/19 0146 12/26/19 0223 12/26/19 0300 12/26/19 0329  GLUCAP >600* >600* 582* 494* 491*    Radiological Exams on Admission: DG Foot Complete Left  Result Date: 12/26/2019 CLINICAL DATA:  Status post left great toe amputation and subsequent swelling in the area. EXAM: LEFT FOOT - COMPLETE 3+ VIEW COMPARISON:  November 15, 2019 FINDINGS: There is prior surgical amputation of the left great toe. There is no evidence of acute fracture or dislocation. There is no evidence of significant arthropathy. A small plantar calcaneal spur is noted. Mild to moderate severity soft tissue swelling is seen along the site of prior left great toe amputation no soft tissue air is seen. IMPRESSION: 1. Prior surgical amputation of the left great toe with soft tissue swelling seen at the surgical site. Electronically Signed   By: Virgina Norfolk M.D.   On: 12/26/2019 01:20    EKG: I independently viewed the EKG done and my findings are as followed: Sinus rhythm at a rate of 73 bpm  Assessment/Plan Present on Admission: . Hyperosmolar hyperglycemic state (HHS) ([place]) . Essential hypertension, benign . Hypercholesteremia  Active Problems:   Essential hypertension, benign   Hypercholesteremia   Personal history of noncompliance with medical treatment, presenting hazards to health   Hyperosmolar hyperglycemic state (HHS) ([place])   Hyperglycemia due to diabetes mellitus ([place])   Lactic acidosis  Hyperosmolar hyperglycemic state (HHS) Hyperglycemia secondary to poorly controlled type 2 diabetes mellitus Continue insulin drip, IV LR with IV potassium per [facility]HS Endo tool Transition IV LR to D5 LR when serum glucose reaches 300mg /dL UA and VBG pending Continue serial BMP  Consider transitioning patient to subcu insulin  once patient is able to tolerate oral intake Continue NPO at this time  Lactic acidosis secondary to multifactorial including above and dehydration Lactic acid 2.7 > 2.6; continue to trend lactic acid  Pseudohyponatremia Na 123; corrected sodium level based on blood glucose level (833)= 135 Continue to monitor sodium level  Personal history of noncompliance with medical treatment Patient was counseled on importance of being compliant with medication regimen  Essential hypertension Continue metoprolol  when patient resumes oral intake  Hypercholesterolemia Continue Lipitor when patient resumes oral intake  CAD Continue Plavix and Lipitor when patient resumes oral intake  Presumed chronic diastolic heart failure Patient appears to be euvolemic.   DVT prophylaxis: SCD   Code Status: Full code  Family Communication: None at bedside  Disposition Plan:  Patient is from:                        home Anticipated DC to:                    home Anticipated DC date:               1 day Anticipated DC barriers:          Patient requires inpatient management of HHS  Consults called: None  Admission status: Observation   Bernadette Hoit MD [facility]  12/26/2019, 3:55 AM

## 2019-12-26 NOTE — Progress Notes (Signed)
PT Cancellation Note  Patient Details Name: Pamela Knight MRN: [id number] DOB: 22-Nov-1960   Cancelled Treatment:    Reason Eval/Treat Not Completed: Fatigue/lethargy limiting ability to participate. Attempted x2 and patient lethargic and unable to participate, RN aware.   2:23 PM,12/26/19 Domenic Moras, PT, DPT Physical Therapist at [facility]

## 2019-12-26 NOTE — Consult Note (Signed)
@LOGO @   Referring Provider: [facility]  Primary Care Physician:  Patient, No Pcp Per Primary Gastroenterologist:  Dr. Laural Golden (previously unassigned)  Date of Admission: 12/25/19 Date of Consultation: 12/26/19  Reason for Consultation:  Heme positive stool  HPI:  Pamela Knight is a 59 y.o. year old female  with medical history significant for coronary artery disease with history of CABG per patient, CHF, DM 2, hypertension, hyperlipidemia, depression, medication noncompliance who presents to the ED due to excessive thirst and increased urinary frequency.  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  She reported taking antibiotics at discharge but has not been taking insulin.   ED Course: In the emergency department, she was hemodynamically stable.  Work-up in the ED showed hemoglobin 12.6, WBC 9.0, hyperglycemia with glucose >600, hyponatremia, lactic acid 2.7, beta hydroxybutyrate  0.15, CRP 4.3, sed rate 42.  Left foot x-ray showed prior surgical limitation of the left great toe with soft tissue swelling seen at the surgical site.  She was started on IV insulin drip.  Hospitalist was asked to admit patient for further evaluation and management.  Incidentally, fecal occult blood was found to be positive.  Patient reported history of intermittent hematochezia in the setting of straining.  Plavix was placed on hold and GI was consulted.  Notably, hemoglobin 13.8 this morning.  Today:  Rectal bleeding has been off and on for about 4 months.  Occurring 1-2 times a week in the setting of constipation and straining.  Notes bright red blood on toilet tissue and in toilet water.  Cannot tell me the last time this occurred.  Constipation is chronic and she does not take anything for this.  Her stools are hard.  States she has rectal pain when having a bowel movement.  No black stools.  No routine abdominal pain.   Later states she does have intermittent right lower quadrant abdominal pain that she notices a bit more frequently over the last couple of days.  No GERD symptoms, dysphagia, nausea, or vomiting.  Reports weight loss.  States she has not been eating well as she has not been able to get the food she needs.  Reports she weighed around 200 pounds in March.  Taking Aleve. States she takes 3 a day a couple days a week for back pain.   No prior colonoscopy.   States she has been homeless. Currently staying with her friend. Trying to find a place to live in [place]. Has SSI/disability as income.    Past Medical History:  Diagnosis Date  . Diabetes mellitus, type II ([place])   . Heart attack ([place])   . Heart failure ([place])   . Hyperlipidemia   . Hypertension   . Kidney disease     Past Surgical History:  Procedure Laterality Date  . AMPUTATION Left 11/17/2019   Procedure: AMPUTATION LEFT GREAT TOE;  Surgeon: Erle Crocker, MD;  Location: [facility];  Service: Orthopedics;  Laterality: Left;  . TONSILLECTOMY      Prior to Admission medications   Medication Sig Start Date End Date Taking? Authorizing Provider  aspirin EC 81 MG tablet Take 1 tablet (81 mg total) by mouth daily. 11/25/19  Yes Hosie Poisson, MD  atorvastatin (LIPITOR) 40 MG tablet Take 1 tablet (40 mg total) by mouth daily. 11/25/19  Yes Hosie Poisson, MD  clopidogrel (PLAVIX) 75 MG tablet Take 1 tablet (75 mg total) by mouth daily. 11/25/19  Yes Hosie Poisson, MD  fenofibrate (TRICOR) 145 MG tablet Take 1 tablet (145 mg total) by mouth daily. 11/25/19  Yes Hosie Poisson, MD  gabapentin (NEURONTIN) 800 MG tablet Take 1 tablet (800 mg total) by mouth 3 (three) times daily. 11/25/19  Yes Hosie Poisson, MD  metFORMIN (GLUCOPHAGE) 500 MG tablet Take 1 tablet (500 mg total) by mouth 2 (two) times daily with a meal. 11/25/19  Yes Hosie Poisson, MD  metoprolol tartrate (LOPRESSOR) 50 MG tablet Take 1 tablet (50 mg total) by mouth  daily. 11/25/19  Yes Hosie Poisson, MD  sertraline (ZOLOFT) 100 MG tablet Take 1 tablet (100 mg total) by mouth in the morning and at bedtime. 11/25/19  Yes Hosie Poisson, MD  vitamin B-12 (CYANOCOBALAMIN) 1000 MCG tablet Take 1,000 mcg by mouth daily.   Yes [provider]  hydrocortisone (ANUSOL-HC) 2.5 % rectal cream Place rectally 4 (four) times daily. Patient not taking: Reported on 12/26/2019 11/23/19   Hosie Poisson, MD  insulin aspart (NOVOLOG) 100 UNIT/ML injection Inject 3 Units into the skin 3 (three) times daily with meals. 11/23/19   Hosie Poisson, MD  insulin aspart (NOVOLOG) 100 UNIT/ML injection CBG 70 - 120: 0 units CBG 121 - 150: 2 units CBG 151 - 200: 3 units CBG 201 - 250: 5 units CBG 251 - 300: 8 units CBG 301 - 350: 11 units CBG 351 - 400: 15 units 11/23/19   Hosie Poisson, MD  insulin glargine (LANTUS) 100 UNIT/ML injection Inject 0.6 mLs (60 Units total) into the skin at bedtime. 10/25/19   Veryl Speak, MD  lamoTRIgine (LAMICTAL) 100 MG tablet Take 1 tablet (100 mg total) by mouth 2 (two) times daily. 11/25/19   Hosie Poisson, MD  Multiple Vitamins-Minerals (CENTRUM SILVER PO) Take by mouth.    [provider]    Current Facility-Administered Medications  Medication Dose Route Frequency Provider Last Rate Last Admin  . 0.9 % NaCl with KCl 20 mEq/ L  infusion   Intravenous Continuous Tat, Shanon Brow, MD 125 mL/hr at 12/26/19 0805 New Bag at 12/26/19 0805  . acetaminophen (TYLENOL) tablet 650 mg  650 mg Oral Q6H PRN Tat, David, MD      . atorvastatin (LIPITOR) tablet 40 mg  40 mg Oral Daily Tat, David, MD      . Chlorhexidine Gluconate Cloth 2 % PADS 6 each  6 each Topical Daily Tat, David, MD      . dextrose 50 % solution 0-50 mL  0-50 mL Intravenous PRN Adefeso, Oladapo, DO      . fenofibrate tablet 160 mg  160 mg Oral Daily Tat, David, MD      . gabapentin (NEURONTIN) capsule 400 mg  400 mg Oral TID Tat, Shanon Brow, MD      . hydrocortisone (ANUSOL-HC)  suppository 25 mg  25 mg Rectal BID Jodi Mourning, Samaria Anes S, PA-C      . insulin aspart (novoLOG) injection 0-15 Units  0-15 Units Subcutaneous TID WC Tat, David, MD      . insulin aspart (novoLOG) injection 0-5 Units  0-5 Units Subcutaneous QHS Tat, David, MD      . insulin glargine (LANTUS) injection 20 Units  20 Units Subcutaneous Daily Tat, David, MD      . metoprolol tartrate (LOPRESSOR) tablet 25 mg  25 mg Oral BID Tat, David, MD      . ondansetron (ZOFRAN) injection 4 mg  4 mg Intravenous Q6H PRN Tat, Shanon Brow, MD      . pantoprazole (  PROTONIX) EC tablet 40 mg  40 mg Oral BID Mina Marble, MD   40 mg at 12/26/19 0805  . polyethylene glycol (MIRALAX / GLYCOLAX) packet 17 g  17 g Oral Daily Joshwa Hemric S, PA-C      . sertraline (ZOLOFT) tablet 100 mg  100 mg Oral Daily Tat, David, MD      . vitamin B-12 (CYANOCOBALAMIN) tablet 500 mcg  500 mcg Oral Daily Tat, David, MD       Current Outpatient Medications  Medication Sig Dispense Refill  . aspirin EC 81 MG tablet Take 1 tablet (81 mg total) by mouth daily. 30 tablet 0  . atorvastatin (LIPITOR) 40 MG tablet Take 1 tablet (40 mg total) by mouth daily. 30 tablet 0  . clopidogrel (PLAVIX) 75 MG tablet Take 1 tablet (75 mg total) by mouth daily. 30 tablet 1  . fenofibrate (TRICOR) 145 MG tablet Take 1 tablet (145 mg total) by mouth daily. 30 tablet 1  . gabapentin (NEURONTIN) 800 MG tablet Take 1 tablet (800 mg total) by mouth 3 (three) times daily. 90 tablet 0  . insulin aspart (NOVOLOG) 100 UNIT/ML injection Inject 3 Units into the skin 3 (three) times daily with meals. 10 mL 11  . insulin aspart (NOVOLOG) 100 UNIT/ML injection CBG 70 - 120: 0 units CBG 121 - 150: 2 units CBG 151 - 200: 3 units CBG 201 - 250: 5 units CBG 251 - 300: 8 units CBG 301 - 350: 11 units CBG 351 - 400: 15 units 10 mL 11  . insulin glargine (LANTUS) 100 UNIT/ML injection Inject 0.6 mLs (60 Units total) into the skin at bedtime. 10 mL 0  . lamoTRIgine (LAMICTAL) 100  MG tablet Take 1 tablet (100 mg total) by mouth 2 (two) times daily. 60 tablet 1  . metFORMIN (GLUCOPHAGE) 500 MG tablet Take 1 tablet (500 mg total) by mouth 2 (two) times daily with a meal. 60 tablet 1  . metoprolol tartrate (LOPRESSOR) 50 MG tablet Take 1 tablet (50 mg total) by mouth daily. 30 tablet 1  . sertraline (ZOLOFT) 100 MG tablet Take 1 tablet (100 mg total) by mouth in the morning and at bedtime. 60 tablet 1  . vitamin B-12 (CYANOCOBALAMIN) 1000 MCG tablet Take 1,000 mcg by mouth daily.    . hydrocortisone (ANUSOL-HC) 2.5 % rectal cream Place rectally 4 (four) times daily. (Patient not taking: No sig reported) 30 g 0  . Multiple Vitamins-Minerals (CENTRUM SILVER PO) Take by mouth.      Allergies as of 12/25/2019 - Review Complete 12/25/2019  Allergen Reaction Noted  . Avandia [rosiglitazone] Other (See Comments) 12/18/2015  . Niacin and related Other (See Comments) 12/18/2015    Family History  Problem Relation Age of Onset  . Diabetes Mother   . Hypertension Mother   . Heart failure Mother   . Kidney disease Mother   . Heart attack Mother   . Diabetes Father   . Hypertension Father   . Heart failure Father   . Kidney disease Father   . Heart attack Father   . Diabetes Sister   . Hypertension Sister   . Heart failure Sister   . Kidney disease Sister   . Heart attack Sister   . Diabetes Brother   . Hypertension Brother   . Heart failure Brother   . Kidney disease Brother   . Heart attack Brother   . Colon cancer Neg Hx     Social History  Socioeconomic History  . Marital status: Divorced    Spouse name: Not on file  . Number of children: Not on file  . Years of education: Not on file  . Highest education level: Not on file  Occupational History  . Not on file  Tobacco Use  . Smoking status: Former Research scientist (life sciences)  . Smokeless tobacco: Never Used  Substance and Sexual Activity  . Alcohol use: No  . Drug use: No  . Sexual activity: Not on file  Other Topics  Concern  . Not on file  Social History Narrative  . Not on file   Social Determinants of Health   Financial Resource Strain: Not on file  Food Insecurity: Not on file  Transportation Needs: Not on file  Physical Activity: Not on file  Stress: Not on file  Social Connections: Not on file  Intimate Partner Violence: Not on file    Review of Systems: Gen: Denies fever, chills, cold or flulike symptoms. Admits to dizziness with gabapentin.  CV: Denies chest pain or heart palpitations Resp: Admits to chronic shortness of breath with walking. No cough.  GI: see HPI GU : Denies urinary burning, urinary frequency, urinary incontinence.  MS: Chronic low back pain. Psych: Admits to depression. Heme: See HPI  Physical Exam: Vital signs in last 24 hours: Temp:  [97.8 F (36.6 C)-98.5 F (36.9 C)] 97.8 F (36.6 C) (12/21 0846) Pulse Rate:  [64-73] 64 (12/21 0846) Resp:  [11-23] 17 (12/21 0846) BP: (111-171)/(60-95) 111/95 (12/21 0846) SpO2:  [94 %-100 %] 95 % (12/21 0846) Weight:  [81.7 kg] 81.7 kg (12/20 2327)   General:   Alert,  Obese, pleasant and cooperative in NAD Head:  Normocephalic and atraumatic. Eyes:  Sclera clear, no icterus.   Conjunctiva pink. Ears:  Normal auditory acuity. Mouth:  Dry mucous membranes.  Lungs:  Clear throughout to auscultation.   No wheezes, crackles, or rhonchi. No acute distress. Heart:  Regular rate and rhythm; no murmurs, clicks, rubs,  or gallops. Abdomen:  Obese, Soft and nondistended. Moderate to severe tenderness to with palpation in the RLQ causing patient to be tearful.  No masses, hepatosplenomegaly or hernias noted. Normal bowel sounds, without guarding, and without rebound.   Rectal:  External exam with non-thromboses external hemorrhoid, no obvious anal fissure. Internal exam with nodular mucosa posteriorly and anteriorly. Patient with pain on rectal exam causing her to be tearful. No bright red blood or melena.    Msk: Normal posture.  S/p left grade toe amputation.  Extremities:  Without edema. Neurologic:  Alert and  oriented x4;  grossly normal neurologically. Skin:  Intact without significant lesions or rashes. Psych: Normal mood and affect. Tearful at times when talking about being homeless.   Intake/Output from previous day: No intake/output data recorded. Intake/Output this shift: Total I/O In: -  Out: 1000 [Urine:1000]  Lab Results: Recent Labs    12/26/19 0058 12/26/19 0710  WBC 9.0 9.0  HGB 12.6 13.9  13.8  HCT 37.0 41.6  41.7  PLT 275 271   BMET Recent Labs    12/26/19 0321 12/26/19 0710 12/26/19 0841  NA 129* 122*  134* 135  K 3.5 4.6  3.8 3.6  CL 94* 85*  101 101  CO2 26 27  23 26   GLUCOSE 517* 837*  254* 170*  BUN 20 23  18 17   CREATININE 0.93 1.02*  0.72 0.69  CALCIUM 8.7* 9.2  9.1 8.7*   LFT Recent Labs    12/26/19  0058  PROT 6.9  ALBUMIN 3.6  AST 13*  ALT 14  ALKPHOS 96  BILITOT 0.4   Studies/Results: DG Foot Complete Left  Result Date: 12/26/2019 CLINICAL DATA:  Status post left great toe amputation and subsequent swelling in the area. EXAM: LEFT FOOT - COMPLETE 3+ VIEW COMPARISON:  November 15, 2019 FINDINGS: There is prior surgical amputation of the left great toe. There is no evidence of acute fracture or dislocation. There is no evidence of significant arthropathy. A small plantar calcaneal spur is noted. Mild to moderate severity soft tissue swelling is seen along the site of prior left great toe amputation no soft tissue air is seen. IMPRESSION: 1. Prior surgical amputation of the left great toe with soft tissue swelling seen at the surgical site. Electronically Signed   By: Virgina Norfolk M.D.   On: 12/26/2019 01:20    Impression: 59 year old female with history of CAD s/p CABG on plavix and aspirin, CHF, DM 2, HTN, HLD, depression, medication noncompliance who presented to the emergency room 12/25/2019 due to excessive thirst and increased urinary  frequency found to have hyperosmolar hyperglycemia with glucose >600. She was treated with IV fluids and IV insulin which has been transitioned to [facility] insulin this morning with plans for diet advancement. She was found to be heme positive and GI was subsequently consulted.  Rectal Bleeding: Patient reports 77-month history of intermittent rectal bleeding with bright red blood on toilet tissue and in toilet water occurring a couple times a week in the setting of chronic constipation, straining with BMs, and rectal pain. She is not taking any medications for constipation and has never had a colonoscopy. No family history of colon cancer. Cannot tell me the last time she had rectal bleeding. Per nursing staff, there was a small amount of red-tinged mucus from patient's rectum yesterday. No overt GI bleeding today. Hemoglobin on admission 12.6>> 13.8 today. On rectal exam, there is a nonthrombosed external hemorrhoid, no obvious anal fissure. Internal rectal exam with nodular mucosa. No bright red blood or melena.   Suspect anorectal source for hematochezia. Possible hemorrhoids in the setting of constipation or other abnormality including malignancy. She will need a colonoscopy for further evaluation. Considering abnormality on rectal exam, would recommend inpatient colonoscopy. Additionally, considering homelessness, I suspect there will likely be issues with follow-up after hospital discharge. Will discuss possibility of colonoscopy with Dr. Laural Golden. Plavix is on hold.   Constipation: Chronic. Not adequately managed. Start MiraLAX 17 g daily.   RLQ abdominal pain: Patient reports intermittent right lower quadrant abdominal pain. States it has become more frequent over the last couple of days. On exam, she has moderate to severe tenderness to moderate palpation in the right lower quadrant causing patient to become tearful. Possibly influenced by constipation. Per patient, appendix and ovaries in situ. No diarrhea  to suggest colitis. Cannot rule out diverticulitis, although this typically presents with left lower quadrant abdominal pain. We will plan for CT A/P with contrast today for further evaluation.  Plan: CT A/P today. Anusol suppository twice daily. Start MiraLAX 17 g daily. Clear liquid diet. Continue to hold Plavix for now.  Monitor for overt GI bleeding. Continue to monitor H/H. Possible colonoscopy with propofol tomorrow. Will discuss with Dr. Laural Golden.    LOS: 0 days    12/26/2019, 9:58 AM   Aliene Altes, PA-C [facility]

## 2019-12-26 NOTE — Progress Notes (Signed)
PROGRESS NOTE  BRIDGETTE WOLDEN OHY:[id number] DOB: 1960/10/14 DOA: 12/25/2019 PCP: Patient, No Pcp Per  Brief History:  59 year old female with a history of coronary artery disease status post CABG, diabetes mellitus type 2, hypertension, hyperlipidemia, CKD stage III presenting with generalized weakness, polydipsia, polyuria, and dizziness.  Notably, the patient was recently discharged from the hospital after a stay from 11/15/2019 to 11/23/2019 where she was treated for diabetic foot infection and osteomyelitis of her left great toe.  The patient underwent amputation of her left great toe on 11/17/2019.  Intraoperative culture showed MSSA.  The patient was initially treated with cefazolin and deescalated to cephalexin.  The patient was discharged home with instructions for 7 additional days of cephalexin.  The patient gives confusing details regarding whether she has been taking any of her medications since her discharge from the hospital.  It appears that she has been taking leftover medication that she has had from previous prescriptions.  Nevertheless, the patient stated that she has not taken her insulin nor did she get her antibiotics filled after her discharge from the hospital on 11/23/2019.  Fortunately, the patient stated that her left foot has been feeling fine.  She has not noted any erythema, drainage, edema.  She stated that she followed up with orthopedics, Dr. Lucia Gaskins on 12/22/2019 to have her stitches removed. In the emergency department, the patient was afebrile hemodynamically stable with oxygen saturation 100% on room air.  BMP showed a sodium of 123 and serum glucose 833 with anion gap of 10.  Serum creatinine was 1.05.  WBC 9.0, hemoglobin 9.6, platelets 275,000.  X-ray of the left foot shows some soft tissue edema without any fracture or fluid collections.  The patient was started on insulin drip and IV fluids for her nonketotic state.  Assessment/Plan: Hyperosmolar  nonketotic state -patient started on IV insulin with q 1 hour CBG check and q 4 hour BMPs -pt started on aggressive fluid resuscitation -Electrolytes were monitored and repleted -transition to Chase insulin this am -diet advancement this am -11/15/19--HbA1C--14.7  Lactic acidosis -Sepsis ruled out -Secondary to volume depletion and possibly the patient's Metformin -Continue IV fluids -Lactic acid peaked 2.7  Diabetes mellitus type 2, uncontrolled with hyperglycemia -Start Lantus 20 units daily -NovoLog sliding scale -TOC consult to clarify the patient's Medicaid situation -Holding Metformin  Hyponatremia -In part due to the patient's hyperglycemia as well as volume depletion -Improving with fluid resuscitation -Continue IV fluids  Heme positive stool/hematochezia -Patient gives history of intermittent hematochezia with straining -Suspect anal rectal source -GI consult -Holding Plavix temporarily due to hematochezia pending GI eval -Hemoglobin 12.6 at the time of presentation  Left foot osteomyelitis -Status post left great toe amputation 11/17/2019 -Surgical site without any signs of infection  Essential hypertension -Restart metoprolol tartrate  Coronary artery disease -No chest pain presently -Personally reviewed EKG--sinus rhythm, nonspecific ST changes -Holding Plavix temporarily due to hematochezia pending GI eval  Hyperlipidemia -Continue statin and TriCor  Depression/anxiety -Continue Zoloft  Diabetic polyneuropathy -Continue gabapentin      Status is: Observation  The patient remains OBS appropriate and will d/c before 2 midnights.  Dispo: The patient is from: Home              Anticipated d/c is to: Home              Anticipated d/c date is: 1 day  Patient currently is not medically stable to d/c.        Family Communication:  no Family at bedside  Consultants:  none  Code Status:   DNR  DVT Prophylaxis:   SCDs   Procedures: As Listed in Progress Note Above  Antibiotics: None    Total time spent 35 minutes.  Greater than 50% spent face to face counseling and coordinating care.     Subjective: Patient denies fevers, chills, headache, chest pain, dyspnea, nausea, vomiting, diarrhea, abdominal pain, dysuria, hematuria, hematochezia, and melena.   Objective: Vitals:   12/26/19 0530 12/26/19 0600 12/26/19 0630 12/26/19 0700  BP: (!) 171/87 137/81 (!) 148/82 120/68  Pulse: 73 68 71 66  Resp: 15 14 13 11   Temp:      TempSrc:      SpO2: 100% 98% 95% 95%  Weight:      Height:       No intake or output data in the 24 hours ending 12/26/19 0733 Weight change:  Exam:   General:  Pt is alert, follows commands appropriately, not in acute distress  HEENT: No icterus, No thrush, No neck mass, [place]/AT  Cardiovascular: RRR, S1/S2, no rubs, no gallops  Respiratory: CTA bilaterally, no wheezing, no crackles, no rhonchi  Abdomen: Soft/+BS, non tender, non distended, no guarding  Extremities: No edema, No lymphangitis, No petechiae, No rashes, no synovitis;  Left foot surgical site without drainage, erythema   Data Reviewed: I have personally reviewed following labs and imaging studies Basic Metabolic Panel: Recent Labs  Lab 12/26/19 0058 12/26/19 0321  NA 123* 129*  K 4.5 3.5  CL 86* 94*  CO2 27 26  GLUCOSE 833* 517*  BUN 22 20  CREATININE 1.05* 0.93  CALCIUM 9.3 8.7*   Liver Function Tests: Recent Labs  Lab 12/26/19 0058  AST 13*  ALT 14  ALKPHOS 96  BILITOT 0.4  PROT 6.9  ALBUMIN 3.6   No results for input(s): LIPASE, AMYLASE in the last 168 hours. No results for input(s): AMMONIA in the last 168 hours. Coagulation Profile: No results for input(s): INR, PROTIME in the last 168 hours. CBC: Recent Labs  Lab 12/26/19 0058  WBC 9.0  HGB 12.6  HCT 37.0  MCV 90.7  PLT 275   Cardiac Enzymes: No results for input(s): CKTOTAL, CKMB, CKMBINDEX, TROPONINI in  the last 168 hours. BNP: Invalid input(s): POCBNP CBG: Recent Labs  Lab 12/26/19 0359 12/26/19 0436 12/26/19 0528 12/26/19 0624 12/26/19 0732  GLUCAP 438* 324* 275* 270* 177*   HbA1C: No results for input(s): HGBA1C in the last 72 hours. Urine analysis:    Component Value Date/Time   COLORURINE STRAW (A) 12/26/2019 0008   APPEARANCEUR CLEAR 12/26/2019 0008   LABSPEC 1.024 12/26/2019 0008   PHURINE 7.0 12/26/2019 0008   GLUCOSEU >=500 (A) 12/26/2019 0008   HGBUR LARGE (A) 12/26/2019 0008   BILIRUBINUR NEGATIVE 12/26/2019 0008   KETONESUR NEGATIVE 12/26/2019 0008   PROTEINUR NEGATIVE 12/26/2019 0008   NITRITE NEGATIVE 12/26/2019 0008   LEUKOCYTESUR TRACE (A) 12/26/2019 0008   Sepsis Labs: @LABRCNTIP (procalcitonin:4,lacticidven:4) ) Recent Results (from the past 240 hour(s))  Resp Panel by RT-PCR (Flu A&B, Covid) Nasopharyngeal Swab     Status: None   Collection Time: 12/26/19  1:49 AM   Specimen: Nasopharyngeal Swab; Nasopharyngeal(NP) swabs in vial transport medium  Result Value Ref Range Status   SARS Coronavirus 2 by RT PCR NEGATIVE NEGATIVE Final    Comment: (NOTE) SARS-CoV-2 target nucleic acids are NOT  DETECTED.  The SARS-CoV-2 RNA is generally detectable in upper respiratory specimens during the acute phase of infection. The lowest concentration of SARS-CoV-2 viral copies this assay can detect is 138 copies/mL. A negative result does not preclude SARS-Cov-2 infection and should not be used as the sole basis for treatment or other patient management decisions. A negative result may occur with  improper specimen collection/handling, submission of specimen other than nasopharyngeal swab, presence of viral mutation(s) within the areas targeted by this assay, and inadequate number of viral copies(<138 copies/mL). A negative result must be combined with clinical observations, patient history, and epidemiological information. The expected result is Negative.  Fact  Sheet for Patients:  EntrepreneurPulse.com.au  Fact Sheet for Healthcare Providers:  IncredibleEmployment.be  This test is no t yet approved or cleared by the [place] FDA and  has been authorized for detection and/or diagnosis of SARS-CoV-2 by FDA under an Emergency Use Authorization (EUA). This EUA will remain  in effect (meaning this test can be used) for the duration of the COVID-19 declaration under Section 564(b)(1) of the Act, 21 U.S.C.section 360bbb-3(b)(1), unless the authorization is terminated  or revoked sooner.       Influenza A by PCR NEGATIVE NEGATIVE Final   Influenza B by PCR NEGATIVE NEGATIVE Final    Comment: (NOTE) The Xpert Xpress SARS-CoV-2/FLU/RSV plus assay is intended as an aid in the diagnosis of influenza from Nasopharyngeal swab specimens and should not be used as a sole basis for treatment. Nasal washings and aspirates are unacceptable for Xpert Xpress SARS-CoV-2/FLU/RSV testing.  Fact Sheet for Patients: EntrepreneurPulse.com.au  Fact Sheet for Healthcare Providers: IncredibleEmployment.be  This test is not yet approved or cleared by the [place] FDA and has been authorized for detection and/or diagnosis of SARS-CoV-2 by FDA under an Emergency Use Authorization (EUA). This EUA will remain in effect (meaning this test can be used) for the duration of the COVID-19 declaration under Section 564(b)(1) of the Act, 21 U.S.C. section 360bbb-3(b)(1), unless the authorization is terminated or revoked.  Performed at [facility], [address], [place], [place] [address]      Scheduled Meds: . [START ON 12/29/2019] pantoprazole  40 mg Intravenous Q12H   Continuous Infusions: . dextrose 5% lactated ringers    . insulin 8 Units/hr (12/26/19 0629)  . lactated ringers 125 mL/hr at 12/26/19 0258  . pantoprozole (PROTONIX) infusion    . pantoprazole (PROTONIX) 80 mg IVPB       Procedures/Studies: DG Foot Complete Left  Result Date: 12/26/2019 CLINICAL DATA:  Status post left great toe amputation and subsequent swelling in the area. EXAM: LEFT FOOT - COMPLETE 3+ VIEW COMPARISON:  November 15, 2019 FINDINGS: There is prior surgical amputation of the left great toe. There is no evidence of acute fracture or dislocation. There is no evidence of significant arthropathy. A small plantar calcaneal spur is noted. Mild to moderate severity soft tissue swelling is seen along the site of prior left great toe amputation no soft tissue air is seen. IMPRESSION: 1. Prior surgical amputation of the left great toe with soft tissue swelling seen at the surgical site. Electronically Signed   By: Virgina Norfolk M.D.   On: 12/26/2019 01:20    Orson Eva, DO  [facility]  If 7PM-7AM, please contact night-coverage [email or web address] Password [facility] 12/26/2019, 7:33 AM   LOS: 0 days

## 2019-12-27 ENCOUNTER — Observation Stay (HOSPITAL_COMMUNITY): Payer: Medicaid - Out of State | Admitting: Anesthesiology

## 2019-12-27 ENCOUNTER — Encounter (HOSPITAL_COMMUNITY): Payer: Self-pay | Admitting: Internal Medicine

## 2019-12-27 ENCOUNTER — Encounter (HOSPITAL_COMMUNITY)
Admission: EM | Disposition: A | Discharge status: Home / Self Care | Payer: Self-pay | Source: Home / Self Care | Attending: Emergency Medicine

## 2019-12-27 DIAGNOSIS — E1165 Type 2 diabetes mellitus with hyperglycemia: Secondary | ICD-10-CM | POA: Diagnosis not present

## 2019-12-27 DIAGNOSIS — D120 Benign neoplasm of cecum: Secondary | ICD-10-CM | POA: Diagnosis not present

## 2019-12-27 DIAGNOSIS — E1101 Type 2 diabetes mellitus with hyperosmolarity with coma: Secondary | ICD-10-CM

## 2019-12-27 DIAGNOSIS — R195 Other fecal abnormalities: Secondary | ICD-10-CM | POA: Diagnosis not present

## 2019-12-27 DIAGNOSIS — E860 Dehydration: Secondary | ICD-10-CM | POA: Diagnosis not present

## 2019-12-27 DIAGNOSIS — Z87891 Personal history of nicotine dependence: Secondary | ICD-10-CM | POA: Diagnosis not present

## 2019-12-27 DIAGNOSIS — D123 Benign neoplasm of transverse colon: Secondary | ICD-10-CM | POA: Diagnosis not present

## 2019-12-27 DIAGNOSIS — I10 Essential (primary) hypertension: Secondary | ICD-10-CM | POA: Diagnosis not present

## 2019-12-27 DIAGNOSIS — K625 Hemorrhage of anus and rectum: Secondary | ICD-10-CM | POA: Diagnosis not present

## 2019-12-27 DIAGNOSIS — E7800 Pure hypercholesterolemia, unspecified: Secondary | ICD-10-CM | POA: Diagnosis not present

## 2019-12-27 DIAGNOSIS — Z20822 Contact with and (suspected) exposure to covid-19: Secondary | ICD-10-CM | POA: Diagnosis not present

## 2019-12-27 DIAGNOSIS — K644 Residual hemorrhoidal skin tags: Secondary | ICD-10-CM

## 2019-12-27 DIAGNOSIS — K6289 Other specified diseases of anus and rectum: Secondary | ICD-10-CM

## 2019-12-27 DIAGNOSIS — K626 Ulcer of anus and rectum: Secondary | ICD-10-CM | POA: Diagnosis not present

## 2019-12-27 HISTORY — PX: COLONOSCOPY WITH PROPOFOL: SHX5780

## 2019-12-27 HISTORY — PX: POLYPECTOMY: SHX5525

## 2019-12-27 LAB — BASIC METABOLIC PANEL
Anion gap: 6 (ref 5–15)
BUN: 10 mg/dL (ref 8–23)
CO2: 23 mmol/L (ref 22–32)
Calcium: 8.5 mg/dL — ABNORMAL LOW (ref 8.9–10.3)
Chloride: 106 mmol/L (ref 98–111)
Creatinine, Ser: 0.57 mg/dL (ref 0.44–1.00)
GFR, Estimated: >60 mL/min (ref >60)
Glucose, Bld: 223 mg/dL — ABNORMAL HIGH (ref 70–99)
Potassium: 4.3 mmol/L (ref 3.5–5.1)
Sodium: 135 mmol/L (ref 135–145)

## 2019-12-27 LAB — GLUCOSE, CAPILLARY
Glucose-Capillary: 198 mg/dL — ABNORMAL HIGH (ref 70–99)
Glucose-Capillary: 203 mg/dL — ABNORMAL HIGH (ref 70–99)
Glucose-Capillary: 202 mg/dL — ABNORMAL HIGH (ref 70–99)
Glucose-Capillary: 163 mg/dL — ABNORMAL HIGH (ref 70–99)
Glucose-Capillary: 203 mg/dL — ABNORMAL HIGH (ref 70–99)

## 2019-12-27 LAB — CBC
HCT: 34.9 % — ABNORMAL LOW (ref 36.0–46.0)
Hemoglobin: 11.4 g/dL — ABNORMAL LOW (ref 12.0–15.0)
MCH: 30.3 pg (ref 26.0–34.0)
MCHC: 32.7 g/dL (ref 30.0–36.0)
MCV: 92.8 fL (ref 80.0–100.0)
Platelets: 250 10*3/uL (ref 150–400)
RBC: 3.76 MIL/uL — ABNORMAL LOW (ref 3.87–5.11)
RDW: 13.9 % (ref 11.5–15.5)
WBC: 6.4 10*3/uL (ref 4.0–10.5)
nRBC: 0 % (ref 0.0–0.2)

## 2019-12-27 LAB — MAGNESIUM: Magnesium: 1.5 mg/dL — ABNORMAL LOW (ref 1.7–2.4)

## 2019-12-27 SURGERY — COLONOSCOPY WITH PROPOFOL
Anesthesia: General

## 2019-12-27 MED ORDER — MAGNESIUM SULFATE 4 GM/100ML IV SOLN
4.0000 g | Freq: Once | INTRAVENOUS | Status: AC
  Administered 2019-12-27: 4 g via INTRAVENOUS
  Filled 2019-12-27: qty 100

## 2019-12-27 MED ORDER — LACTATED RINGERS IV SOLN: INTRAVENOUS | Status: DC | PRN

## 2019-12-27 MED ORDER — PROPOFOL 10 MG/ML IV BOLUS
INTRAVENOUS | Status: DC | PRN
  Administered 2019-12-27 (×4): 50 mg via INTRAVENOUS

## 2019-12-27 MED ORDER — LACTATED RINGERS IV SOLN
Freq: Once | INTRAVENOUS | Status: AC
  Administered 2019-12-27: 1000 mL via INTRAVENOUS

## 2019-12-27 NOTE — Op Note (Signed)
[facility] Patient Name: Pamela Knight Procedure Date: 12/27/2019 1:08 PM MRN: [id number] Date of Birth: [birth year] Attending MD: Hildred Laser , MD CSN: [id number] Age: 59 Admit Type: Inpatient Procedure:                Colonoscopy Indications:              Rectal bleeding Providers:                Hildred Laser, MD, Jeanann Lewandowsky. Sharon Seller, RN, Raphael Gibney, Technician Referring MD:             Orson Eva, DI Medicines:                Propofol per Anesthesia Complications:            No immediate complications. Estimated Blood Loss:     Estimated blood loss was minimal. Procedure:                Pre-Anesthesia Assessment:                           - Prior to the procedure, a History and Physical                            was performed, and patient medications and                            allergies were reviewed. The patient's tolerance of                            previous anesthesia was also reviewed. The risks                            and benefits of the procedure and the sedation                            options and risks were discussed with the patient.                            All questions were answered, and informed consent                            was obtained. Prior Anticoagulants: The patient has                            taken no previous anticoagulant or antiplatelet                            agents. ASA Grade Assessment: III - A patient with                            severe systemic disease. After reviewing the risks  and benefits, the patient was deemed in                            satisfactory condition to undergo the procedure.                           After obtaining informed consent, the colonoscope                            was passed under direct vision. Throughout the                            procedure, the patient's blood pressure, pulse, and                            oxygen  saturations were monitored continuously. The                            [id number]-[id number] ([id number]) scope was introduced through                            the anus and advanced to the the cecum, identified                            by appendiceal orifice and ileocecal valve. The                            colonoscopy was performed without difficulty. The                            patient tolerated the procedure well. The quality                            of the bowel preparation was excellent. The                            ileocecal valve, appendiceal orifice, and rectum                            were photographed. Scope In: 1:28:24 PM Scope Out: 1:50:45 PM Scope Withdrawal Time: 0 hours 15 minutes 3 seconds  Total Procedure Duration: 0 hours 22 minutes 21 seconds  Findings:      Skin tags were found on perianal exam.      The digital rectal exam findings include palpable rectal flat lesion       with raised margins noted along anterior and right lateral wall..      Two polyps were found in the transverse colon and cecum. The polyps were       small in size. These were biopsied with a cold forceps for histology.      A single flat 30 x 40 mm mm ulcer was found in the distal rectum with       raised margin extending to dentate line. No bleeding was present. No       stigmata of recent bleeding were seen. This was  biopsied with a cold       forceps for histology. The pathology specimen was placed into Bottle       Number 2. Impression:               - Perianal skin tags found on perianal exam.                           - Palpable rectal flat lesion with raised margin                            found on digital rectal exam.                           - Two small polyps in the transverse colon and in                            the cecum. Biopsied.                           - Distal ulcerated lesion with raised margins                            concerning for malignant process..  Biopsied. Moderate Sedation:      Per Anesthesia Care Recommendation:           - Return patient to hospital ward for ongoing care.                           - Diabetic (ADA) diet today.                           - Continue present medications.                           - Await pathology results.                           - Chest CT. Procedure Code(s):        --- Professional ---                           [phone], Colonoscopy, flexible; with biopsy, single                            or multiple Diagnosis Code(s):        --- Professional ---                           K62.89, Other specified diseases of anus and rectum                           K63.5, Polyp of colon                           K62.6, Ulcer of anus and rectum  K64.4, Residual hemorrhoidal skin tags                           K62.5, Hemorrhage of anus and rectum CPT copyright 2019 American Medical Association. All rights reserved. The codes documented in this report are preliminary and upon coder review may  be revised to meet current compliance requirements. Hildred Laser, MD Hildred Laser, MD 12/27/2019 2:02:17 PM This report has been signed electronically. Number of Addenda: 0

## 2019-12-27 NOTE — Progress Notes (Signed)
Patient doing ok this morning. Remains agreeable to colonoscopy today as scheduled. Per nursing, patient passing clear liquid after prep but still has half jug remaining and will continue to give prep.   Labs reviewed and no leucocytosis, hgb acceptable. BMP with hypoglycemia at 223 otherwise normal kidney function and electrolytes. Mild hypocalcemia at 8.5  Will proceed with colonoscopy as scheduled.   Thank you for allowing [place] to participate in the care of Pamela Knight  [name], DNP, AGNP-C Adult & Gerontological Nurse Practitioner [facility]

## 2019-12-27 NOTE — Plan of Care (Signed)
  Problem: Acute Rehab PT Goals(only PT should resolve) Goal: Pt Will Go Supine/Side To Sit Outcome: Progressing Flowsheets (Taken 12/27/2019 0946) Pt will go Supine/Side to Sit: with modified independence Goal: Patient Will Transfer Sit To/From Stand Outcome: Progressing Flowsheets (Taken 12/27/2019 0946) Patient will transfer sit to/from stand: with modified independence Goal: Pt Will Transfer Bed To Chair/Chair To Bed Outcome: Progressing Flowsheets (Taken 12/27/2019 0946) Pt will Transfer Bed to Chair/Chair to Bed: with modified independence Goal: Pt Will Ambulate Outcome: Progressing Flowsheets (Taken 12/27/2019 0946) Pt will Ambulate:  100 feet  with modified independence  with least restrictive assistive device  9:47 AM,12/27/19 Domenic Moras, PT, DPT Physical Therapist at [facility]

## 2019-12-27 NOTE — Progress Notes (Signed)
PROGRESS NOTE   Pamela Knight  IOE:[id number] DOB: 01-12-1960 DOA: 12/25/2019 PCP: Patient, No Pcp Per   Chief Complaint  Patient presents with  . Hyperglycemia    Brief Admission History:  59 y.o. female with medical history significant for coronary artery disease with history of CABG per patient, CHF, DM 2, hypertension, hyperlipidemia, depression, medication noncompliance who presents to the ED due to excessive thirst and increased urinary frequency.  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  She states that she has not been taking her insulin since she was discharged from the hospital, though she has been taking Metformin.  She has not been checking blood glucose level at home because she states that she does not have any means of checking it, she complained of some dizziness, excessive thirst and increased urinary frequency within last few days, so she decided to go to the ED for further evaluation and management.  Patient states that she is currently staying with a friend, she denies chest pain, shortness of breath, fever, chills, nausea, vomiting or abdominal pain.  ED Course: In the emergency department, she was hemodynamically stable.  Work-up in the ED showed hyperglycemia, hyponatremia, lactic acid 2.7, beta hydroxybutyrate  0.15, CRP 4.3, sed rate 42.  Left foot x-ray showed prior surgical limitation of the left great toe with soft tissue swelling seen at the surgical site.  She was started on IV insulin drip per HHS Endo tool.  Hospitalist was asked to admit patient for further evaluation and management.  Assessment & Plan:   Active Problems:   Essential hypertension, benign   Hypercholesteremia   Personal history of noncompliance with  medical treatment, presenting hazards to health   Hyperosmolar hyperglycemic state (HHS) ([place])   Hyperglycemia due to diabetes mellitus ([place])   Lactic acidosis   Dehydration   Pseudohyponatremia   Uncontrolled type 2 diabetes mellitus with hyperglycemia, with long-term current use of insulin (HCC)   Heme positive stool   Hyponatremia   Constipation   RLQ abdominal pain   Hyperosmolar (nonketotic) coma ([place])   1. HHS - blood sugars have improved and patient is now off IV insulin and back on subcutaneous insulin.  Continue to follow CBG closely and continue current management.  2. Lactic acidosis - resolved now after hydration.  3. Rectal bleeding -Pt for colonoscopy later today.  4. Chronic diastolic heart failure - stable.   5. Poor compliance - ongoing counseling.   6. HLD - resume home atorvastatin when able to take orals.  7. CAD - no chest pain symptoms- resume home plavix when able.  8. Essential hypertension  - stable resumed home metoprolol.   DVT prophylaxis:  SCDs Code Status:  Full  Family Communication:   Disposition: home   Status is: Inpatient  Remains inpatient appropriate because:IV treatments appropriate due to intensity of illness or inability to take PO and Inpatient level of care appropriate due to severity of illness   Dispo: The patient is from: Home              Anticipated d/c is to: Home              Anticipated d/c date is: 1 day              Patient currently is not  medically stable to d/c.  Consultants:   GI  Procedures:   Colonoscopy 12/22   Antimicrobials:  n/a  Subjective: Pt remains agreeable to having colonoscopy later today. She has no specific complaints this morning.     Objective: Vitals:   12/27/19 1251 12/27/19 1359 12/27/19 1400 12/27/19 1415  BP: (!) 146/83 106/60 109/61 128/68  Pulse: 65 (!) 59 (!) 58 (!) 59  Resp: 16 15 11 15   Temp: 97.8 F (36.6 C) (!) 97.5 F (36.4 C)    TempSrc: Oral     SpO2: 100% 99% 99% 99%   Weight:      Height:        Intake/Output Summary (Last 24 hours) at 12/27/2019 1455 Last data filed at 12/27/2019 1352 Gross per 24 hour  Intake 720 ml  Output --  Net 720 ml   Filed Weights   12/25/19 2327 12/26/19 1015  Weight: 81.7 kg 81.5 kg   Examination:  General exam: Appears calm and comfortable  Respiratory system: Clear to auscultation. Respiratory effort normal. Cardiovascular system: S1 & S2 heard, RRR. No JVD, murmurs, rubs, gallops or clicks. No pedal edema. Gastrointestinal system: Abdomen is nondistended, soft and nontender. No organomegaly or masses felt. Normal bowel sounds heard. Central nervous system: Alert and oriented. No focal neurological deficits. Extremities: Symmetric 5 x 5 power. Skin: No rashes, lesions or ulcers Psychiatry: Judgement and insight appear normal. Mood & affect appropriate.   Data Reviewed: I have personally reviewed following labs and imaging studies  CBC: Recent Labs  Lab 12/26/19 0058 12/26/19 0710 12/27/19 0527  WBC 9.0 9.0 6.4  HGB 12.6 13.9  13.8 11.4*  HCT 37.0 41.6  41.7 34.9*  MCV 90.7 93.1 92.8  PLT 275 271 [id number]    Basic Metabolic Panel: Recent Labs  Lab 12/26/19 0058 12/26/19 0321 12/26/19 0710 12/26/19 0841 12/27/19 0527  NA 123* 129* 122*  134* 135 135  K 4.5 3.5 4.6  3.8 3.6 4.3  CL 86* 94* 85*  101 101 106  CO2 27 26 27  23 26 23   GLUCOSE 833* 517* 837*  254* 170* 223*  BUN 22 20 23  18 17 10   CREATININE 1.05* 0.93 1.02*  0.72 0.69 0.57  CALCIUM 9.3 8.7* 9.2  9.1 8.7* 8.5*  MG  --   --   --   --  1.5*    GFR: Estimated Creatinine Clearance: 73.1 mL/min (by C-G formula based on SCr of 0.57 mg/dL).  Liver Function Tests: Recent Labs  Lab 12/26/19 0058  AST 13*  ALT 14  ALKPHOS 96  BILITOT 0.4  PROT 6.9  ALBUMIN 3.6    CBG: Recent Labs  Lab 12/26/19 1051 12/26/19 1710 12/26/19 2041 12/27/19 0737 12/27/19 1121  GLUCAP 209* 275* 198* 198* 203*    Recent Results (from  the past 240 hour(s))  Resp Panel by RT-PCR (Flu A&B, Covid) Nasopharyngeal Swab     Status: None   Collection Time: 12/26/19  1:49 AM   Specimen: Nasopharyngeal Swab; Nasopharyngeal(NP) swabs in vial transport medium  Result Value Ref Range Status   SARS Coronavirus 2 by RT PCR NEGATIVE NEGATIVE Final    Comment: (NOTE) SARS-CoV-2 target nucleic acids are NOT DETECTED.  The SARS-CoV-2 RNA is generally detectable in upper respiratory specimens during the acute phase of infection. The lowest concentration of SARS-CoV-2 viral copies this assay can detect is 138 copies/mL. A negative result does not preclude SARS-Cov-2 infection and should not be used as the sole  basis for treatment or other patient management decisions. A negative result may occur with  improper specimen collection/handling, submission of specimen other than nasopharyngeal swab, presence of viral mutation(s) within the areas targeted by this assay, and inadequate number of viral copies(<138 copies/mL). A negative result must be combined with clinical observations, patient history, and epidemiological information. The expected result is Negative.  Fact Sheet for Patients:  EntrepreneurPulse.com.au  Fact Sheet for Healthcare Providers:  IncredibleEmployment.be  This test is no t yet approved or cleared by the [place] FDA and  has been authorized for detection and/or diagnosis of SARS-CoV-2 by FDA under an Emergency Use Authorization (EUA). This EUA will remain  in effect (meaning this test can be used) for the duration of the COVID-19 declaration under Section 564(b)(1) of the Act, 21 U.S.C.section 360bbb-3(b)(1), unless the authorization is terminated  or revoked sooner.       Influenza A by PCR NEGATIVE NEGATIVE Final   Influenza B by PCR NEGATIVE NEGATIVE Final    Comment: (NOTE) The Xpert Xpress SARS-CoV-2/FLU/RSV plus assay is intended as an aid in the diagnosis of  influenza from Nasopharyngeal swab specimens and should not be used as a sole basis for treatment. Nasal washings and aspirates are unacceptable for Xpert Xpress SARS-CoV-2/FLU/RSV testing.  Fact Sheet for Patients: EntrepreneurPulse.com.au  Fact Sheet for Healthcare Providers: IncredibleEmployment.be  This test is not yet approved or cleared by the [place] FDA and has been authorized for detection and/or diagnosis of SARS-CoV-2 by FDA under an Emergency Use Authorization (EUA). This EUA will remain in effect (meaning this test can be used) for the duration of the COVID-19 declaration under Section 564(b)(1) of the Act, 21 U.S.C. section 360bbb-3(b)(1), unless the authorization is terminated or revoked.  Performed at [facility], [address], [place], [place] [address]      Radiology Studies: CT ABDOMEN PELVIS W CONTRAST  Result Date: 12/26/2019 CLINICAL DATA:  Lower abdominal pain and heme-positive stool EXAM: CT ABDOMEN AND PELVIS WITH CONTRAST TECHNIQUE: Multidetector CT imaging of the abdomen and pelvis was performed using the standard protocol following bolus administration of intravenous contrast. CONTRAST:  120mL OMNIPAQUE IOHEXOL 300 MG/ML  SOLN COMPARISON:  None. FINDINGS: Lower chest: There is mild bibasilar atelectasis. There is no lung base edema or consolidation. Hepatobiliary: No apparent focal liver lesions. Gallbladder is absent. Borderline intrahepatic biliary duct dilatation is likely a consequence of post cholecystectomy state. Common hepatic duct is prominent measuring 12 mm with tapering of the common bile duct distally. No biliary duct mass or calculus is evident on CT examination. Pancreas: No pancreatic mass or inflammatory focus. Pancreatic duct upper normal in size. Spleen: There is a 5 mm apparent hemangioma in the spleen anteriorly. No other splenic lesions are evident. Adrenals/Urinary Tract: Adrenals bilaterally  appear normal. There is no appreciable renal mass or hydronephrosis on either side. No evident renal or ureteral calculus on either side. The urinary bladder is midline with wall thickness within normal limits. Stomach/Bowel: There is moderate stool in the colon. There is no appreciable diverticular disease. No bowel wall or mesenteric thickening. There is a focal ventral hernia slightly to the left of midline at the level of the superior aspect of the iliac crests. This hernia contains fat as well as a loop of transverse colon. No bowel compromise evident. This hernia measures 3.6 cm from right to left dimension at its neck and 2.0 cm from superior to inferior dimension at its neck. A second ventral hernia containing a  loop of colon as well as fat is noted in the mid abdominal region. There is evidence of previous hernia repair in this area. No bowel compromise evident in this region. There is generalized rectus muscle thinning in this area. Elsewhere, there is no bowel wall thickening or bowel obstruction. Terminal ileum appears unremarkable. There is no evident free air or portal venous air. Vascular/Lymphatic: There is no abdominal aortic aneurysm. There is aortic and iliac artery atherosclerotic calcification. Major venous structures appear patent. There is no evident adenopathy in the abdomen or pelvis. Reproductive: Uterus is anteverted. Uterus appears somewhat atrophic. No adnexal masses. Other: Appendix appears normal. Ventral hernias noted above. No abscess or ascites evident in the abdomen or pelvis. Musculoskeletal: There is degenerative change in the lumbar spine. There are no blastic or lytic bone lesions. No intramuscular lesions are evident. IMPRESSION: 1. Midline ventral hernia with second ventral hernia slightly more inferiorly slightly to the left of midline, both containing loops of transverse colon as well as fat. No bowel compromise. Postoperative change at the site of the midline ventral  hernia. 2. Moderate stool in colon. No bowel wall thickening or bowel obstruction. No evident diverticular disease. 3.  Appendix appears normal.  No abscess in the abdomen or pelvis. 4. Gallbladder absent. Mild intrahepatic duct dilatation and proximal common hepatic duct prominence, likely consequences of post cholecystectomy state. 5.  Aortic Atherosclerosis (ICD10-I70.0). Electronically Signed   By: Bretta Bang III M.D.   On: 12/26/2019 10:11   DG Foot Complete Left  Result Date: 12/26/2019 CLINICAL DATA:  Status post left great toe amputation and subsequent swelling in the area. EXAM: LEFT FOOT - COMPLETE 3+ VIEW COMPARISON:  November 15, 2019 FINDINGS: There is prior surgical amputation of the left great toe. There is no evidence of acute fracture or dislocation. There is no evidence of significant arthropathy. A small plantar calcaneal spur is noted. Mild to moderate severity soft tissue swelling is seen along the site of prior left great toe amputation no soft tissue air is seen. IMPRESSION: 1. Prior surgical amputation of the left great toe with soft tissue swelling seen at the surgical site. Electronically Signed   By: Aram Candela M.D.   On: 12/26/2019 01:20   Scheduled Meds: . atorvastatin  40 mg Oral Daily  . Chlorhexidine Gluconate Cloth  6 each Topical Daily  . fenofibrate  160 mg Oral Daily  . gabapentin  400 mg Oral TID  . hydrocortisone  25 mg Rectal BID  . insulin aspart  0-15 Units Subcutaneous TID WC  . insulin aspart  0-5 Units Subcutaneous QHS  . insulin glargine  20 Units Subcutaneous Daily  . metoprolol tartrate  25 mg Oral BID  . pantoprazole  40 mg Oral BID AC  . polyethylene glycol  17 g Oral Daily  . sertraline  100 mg Oral Daily  . vitamin B-12  500 mcg Oral Daily   Continuous Infusions: . 0.9 % NaCl with KCl 20 mEq / L 125 mL/hr at 12/27/19 1155     LOS: 0 days   Time spent: 35 minutes    Terrel Manalo Laural Benes, MD How to contact the [facility] Attending or  Consulting provider 7A - 7P or covering provider during after hours 7P -7A, for this patient?  1. Check the care team in [facility] and look for a) attending/consulting [facility] provider listed and b) the [facility] team listed 2. Log into [email or web address] and use [place]'s universal password to access. If you do not  have the password, please contact the hospital operator. 3. Locate the [facility] provider you are looking for under [facility] and page to a number that you can be directly reached. 4. If you still have difficulty reaching the provider, please page the [facility] (Director on Call) for the Hospitalists listed on amion for assistance.  12/27/2019, 2:55 PM

## 2019-12-27 NOTE — Transfer of Care (Signed)
Immediate Anesthesia Transfer of Care Note  Patient: Pamela Knight  Procedure(s) Performed: COLONOSCOPY WITH PROPOFOL (N/A ) POLYPECTOMY  Patient Location: PACU  Anesthesia Type:General  Level of Consciousness: awake, alert , oriented and patient cooperative  Airway & Oxygen Therapy: Patient Spontanous Breathing  Post-op Assessment: Report given to RN, Post -op Vital signs reviewed and stable and Patient moving all extremities X 4  Post vital signs: Reviewed and stable  Last Vitals:  Vitals Value Taken Time  BP 106/60 12/27/19 1358  Temp    Pulse 58 12/27/19 1400  Resp 11 12/27/19 1400  SpO2 99 % 12/27/19 1400  Vitals shown include unvalidated device data.  Last Pain:  Vitals:   12/27/19 1326  TempSrc:   PainSc: 0-No pain      Patients Stated Pain Goal: 3 (46/28/63 8177)  Complications: No complications documented.

## 2019-12-27 NOTE — Progress Notes (Signed)
Report called to this nurse by [facility] in PACU. No further questions at this time. Awaiting pt

## 2019-12-27 NOTE — Anesthesia Postprocedure Evaluation (Signed)
Anesthesia Post Note  Patient: Pamela Knight  Procedure(s) Performed: COLONOSCOPY WITH PROPOFOL (N/A ) POLYPECTOMY  Patient location during evaluation: PACU Anesthesia Type: General Level of consciousness: awake, oriented, awake and alert and patient cooperative Pain management: satisfactory to patient Vital Signs Assessment: post-procedure vital signs reviewed and stable Respiratory status: spontaneous breathing, respiratory function stable and nonlabored ventilation Cardiovascular status: stable Postop Assessment: no apparent nausea or vomiting Anesthetic complications: no   No complications documented.   Last Vitals:  Vitals:   12/27/19 1251 12/27/19 1359  BP: (!) 146/83 106/60  Pulse: 65 (!) 59  Resp: 16   Temp: 36.6 C (!) 36.4 C  SpO2: 100%     Last Pain:  Vitals:   12/27/19 1326  TempSrc:   PainSc: 0-No pain                 Janan Bogie

## 2019-12-27 NOTE — Evaluation (Signed)
Physical Therapy Evaluation Patient Details Name: Pamela Knight MRN: [id number] DOB: 03-Feb-1960 Today's Date: 12/27/2019   History of Present Illness  Pamela Knight is a 59 y.o. female with medical history significant for coronary artery disease with history of CABG per patient, CHF, DM 2, hypertension, hyperlipidemia, depression, medication noncompliance who presents to the ED due to excessive thirst and increased urinary frequency.  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  She states that she has not been taking her insulin since she was discharged from the hospital, though she has been taking Metformin.  She has not been checking blood glucose level at home because she states that she does not have any means of checking it, she complained of some dizziness, excessive thirst and increased urinary frequency within last few days, so she decided to go to the ED for further evaluation and management.  Patient states that she is currently staying with a friend, she denies chest pain, shortness of breath, fever, chills, nausea, vomiting or abdominal pain.    Clinical Impression  Patient present in room with RN stating she has a colonoscopy today and has to finish her medicine which she had 3-4 cups of while with PT. Patient able to complete bed mobility with supervision, but demos some unsteadiness once EOB. Able to complete sit to stand transfer from EOB with 1 UE assist, and promptly ambulated to toilet where able to stand pivot transfer and urinated and cleaned independently. PT educated on transferring to [facility] once medicine for colonoscopy started working and to take care of line. Patient then ambulated in hallway approx 50 ft, detailed below, demoing slight  unsteadiness throughout and preference for using wall for support despite being offered [facility] or RW to assist. Stand to sit transfer to EOB and then transferred to supine in bed with RN notified of position. Patient will benefit from continued physical therapy in hospital and recommended venue below to increase strength, balance, endurance for safe ADLs and gait.     Follow Up Recommendations Home health PT;Supervision - Intermittent;Supervision for mobility/OOB;Outpatient PT    Equipment Recommendations  Rolling walker with 5" wheels    Recommendations for Other Services       Precautions / Restrictions Precautions Precautions: Fall Restrictions Weight Bearing Restrictions: No      Mobility  Bed Mobility Overal bed mobility: Modified Independent;Needs Assistance Bed Mobility: Supine to Sit;Sit to Supine     Supine to sit: Min guard Sit to supine: Min guard   General bed mobility comments: slow and labored movements    Transfers Overall transfer level: Needs assistance Equipment used: None Transfers: Sit to/from [facility] Sit to Stand: Min guard Stand pivot transfers: Min guard       General transfer comment: slow and labored, slightly unsteady  Ambulation/Gait Ambulation/Gait assistance: Min guard Gait Distance (Feet): 50 Feet Assistive device: None Gait Pattern/deviations: Step-through pattern;Decreased step length - right;Decreased step length - left;Drifts right/left Gait velocity: decreased   General Gait Details: slow and labored, unsteady, would benefit from cane or RW  Stairs            Wheelchair Mobility    Modified Rankin (Stroke Patients Only)       Balance Overall balance assessment: Needs assistance Sitting-balance support: Single extremity supported;Feet supported Sitting balance-Leahy  Scale: Fair Sitting balance - Comments: slightly unsteady, mostly safe   Standing balance support: No upper extremity  supported;During functional activity Standing balance-Leahy Scale: Fair Standing balance comment: fair to poor balance, unsteady during ambulation, sway B, pref to use wall or railing for support.                             Pertinent Vitals/Pain Pain Assessment: No/denies pain    Home Living Family/patient expects to be discharged to:: Private residence Living Arrangements: Non-relatives/Friends Available Help at Discharge: Family;Friend(s);Available 24 hours/day Type of Home: House (homeless living with a friend)       Home Layout: One level Home Equipment: Cane - single point      Prior Function Level of Independence: Independent with assistive device(s)               Hand Dominance        Extremity/Trunk Assessment   Upper Extremity Assessment Upper Extremity Assessment: Overall WFL for tasks assessed    Lower Extremity Assessment Lower Extremity Assessment: Overall WFL for tasks assessed;Generalized weakness    Cervical / Trunk Assessment Cervical / Trunk Assessment: Normal  Communication   Communication: No difficulties  Cognition Arousal/Alertness: Awake/alert Behavior During Therapy: WFL for tasks assessed/performed Overall Cognitive Status: Within Functional Limits for tasks assessed                                        General Comments      Exercises     Assessment/Plan    PT Assessment Patient needs continued PT services  PT Problem List Decreased strength;Decreased activity tolerance;Decreased balance;Decreased mobility;Decreased coordination       PT Treatment Interventions DME instruction;Balance training;Gait training;Neuromuscular re-education;Functional mobility training;Patient/family education;Therapeutic activities;Therapeutic exercise    PT Goals (Current goals can be found in the Care Plan section)  Acute Rehab PT Goals Patient Stated Goal: return home PT Goal Formulation: With patient Time For  Goal Achievement: 01/10/20 Potential to Achieve Goals: Good    Frequency Min 3X/week   Barriers to discharge        Co-evaluation               AM-PAC PT "6 Clicks" Mobility  Outcome Measure Help needed turning from your back to your side while in a flat bed without using bedrails?: None Help needed moving from lying on your back to sitting on the side of a flat bed without using bedrails?: None Help needed moving to and from a bed to a chair (including a wheelchair)?: A Little Help needed standing up from a chair using your arms (e.g., wheelchair or bedside chair)?: A Little Help needed to walk in hospital room?: A Little Help needed climbing 3-5 steps with a railing? : A Lot 6 Click Score: 19    End of Session   Activity Tolerance: Patient tolerated treatment well;Patient limited by fatigue Patient left: in bed;with call bell/phone within reach Nurse Communication: Mobility status PT Visit Diagnosis: Unsteadiness on feet (R26.81);Other abnormalities of gait and mobility (R26.89);Muscle weakness (generalized) (M62.81)    Time: [id number] PT Time Calculation (min) (ACUTE ONLY): 25 min   Charges:   PT Evaluation $PT Eval Moderate Complexity: 1 Mod PT Treatments $Therapeutic Activity: 23-37 mins        9:46 AM,12/27/19 Domenic Moras, PT, DPT Physical Therapist at [facility]

## 2019-12-27 NOTE — Anesthesia Preprocedure Evaluation (Signed)
Anesthesia Evaluation  Patient identified by MRN, date of birth, ID band Patient awake    Reviewed: Allergy & Precautions, NPO status , Patient's Chart, lab work & pertinent test results  History of Anesthesia Complications Negative for: history of anesthetic complications  Airway Mallampati: II  TM Distance: >3 FB Neck ROM: Full    Dental  (+) Missing, Poor Dentition, Dental Advisory Given   Pulmonary shortness of breath and with exertion, former smoker,    Pulmonary exam normal breath sounds clear to auscultation       Cardiovascular Exercise Tolerance: Poor hypertension, Pt. on medications + Past MI, + Cardiac Stents, + CABG and + DOE   Rhythm:Regular Rate:Normal + Systolic murmurs    Neuro/Psych    GI/Hepatic   Endo/Other  diabetes, Well Controlled, Type 2, Oral Hypoglycemic Agents  Renal/GU Renal disease     Musculoskeletal   Abdominal   Peds  Hematology   Anesthesia Other Findings   Reproductive/Obstetrics                           Anesthesia Physical Anesthesia Plan  ASA: III  Anesthesia Plan: General   Post-op Pain Management:    Induction: Intravenous  PONV Risk Score and Plan: TIVA  Airway Management Planned: Nasal Cannula and Natural Airway  Additional Equipment:   Intra-op Plan:   Post-operative Plan:   Informed Consent: I have reviewed the patients History and Physical, chart, labs and discussed the procedure including the risks, benefits and alternatives for the proposed anesthesia with the patient or authorized representative who has indicated his/her understanding and acceptance.    Discussed DNR with patient and Suspend DNR.   Dental advisory given  Plan Discussed with: CRNA and Surgeon  Anesthesia Plan Comments:        Anesthesia Quick Evaluation

## 2019-12-27 NOTE — TOC Progression Note (Signed)
Transition of Care [facility]) - Progression Note    Patient Details  Name: Pamela Knight MRN: [id number] Date of Birth: Jun 04, 1960  [facility]) CM/SW Contact  Salome Arnt, [place] Phone Number: 12/27/2019, 10:33 AM  Clinical Narrative:  LCSW discussed PT evaluation with pt. Pt refuses outpatient PT- she said she has done this before. Discussed home health, but pt is aware that it may be difficult to find agency willing to accept Medicaid. Pt is unsure if she would want home health either. TOC will follow up tomorrow.      Expected Discharge Plan: Home/Self Care Barriers to Discharge: Continued Medical Work up  Expected Discharge Plan and Services Expected Discharge Plan: Home/Self Care In-house Referral: Clinical Social Work   Post Acute Care Choice: NA Living arrangements for the past 2 months: Homeless (Currently living with her friend, Pamela Knight)                 DME Arranged: N/A DME Agency: NA       HH Arranged: NA HH Agency: NA         Social Determinants of Health (SDOH) Interventions    Readmission Risk Interventions No flowsheet data found.

## 2019-12-27 NOTE — Progress Notes (Signed)
Brief colonoscopy note  2 small polyps ablated by cold biopsy and submitted together.  These are located at cecum and transverse colon 3 x 4 cm ulcerated lesion at distal rectum with raised margins extending down to the dentate line.  It is located anteriorly and extending to the right side.  Multiple biopsies taken Anal skin tags.

## 2019-12-28 ENCOUNTER — Inpatient Hospital Stay (HOSPITAL_COMMUNITY): Payer: Medicaid - Out of State

## 2019-12-28 DIAGNOSIS — K5900 Constipation, unspecified: Secondary | ICD-10-CM | POA: Diagnosis not present

## 2019-12-28 DIAGNOSIS — I10 Essential (primary) hypertension: Secondary | ICD-10-CM | POA: Diagnosis not present

## 2019-12-28 DIAGNOSIS — R739 Hyperglycemia, unspecified: Secondary | ICD-10-CM | POA: Diagnosis not present

## 2019-12-28 DIAGNOSIS — E860 Dehydration: Secondary | ICD-10-CM | POA: Diagnosis not present

## 2019-12-28 DIAGNOSIS — K6289 Other specified diseases of anus and rectum: Secondary | ICD-10-CM | POA: Diagnosis not present

## 2019-12-28 DIAGNOSIS — R531 Weakness: Secondary | ICD-10-CM

## 2019-12-28 LAB — BASIC METABOLIC PANEL
Anion gap: 8 (ref 5–15)
BUN: 8 mg/dL (ref 8–23)
CO2: 25 mmol/L (ref 22–32)
Calcium: 8.3 mg/dL — ABNORMAL LOW (ref 8.9–10.3)
Chloride: 101 mmol/L (ref 98–111)
Creatinine, Ser: 0.71 mg/dL (ref 0.44–1.00)
GFR, Estimated: >60 mL/min (ref >60)
Glucose, Bld: 359 mg/dL — ABNORMAL HIGH (ref 70–99)
Potassium: 4.3 mmol/L (ref 3.5–5.1)
Sodium: 134 mmol/L — ABNORMAL LOW (ref 135–145)

## 2019-12-28 LAB — GLUCOSE, CAPILLARY
Glucose-Capillary: 283 mg/dL — ABNORMAL HIGH (ref 70–99)
Glucose-Capillary: 288 mg/dL — ABNORMAL HIGH (ref 70–99)
Glucose-Capillary: 188 mg/dL — ABNORMAL HIGH (ref 70–99)
Glucose-Capillary: 201 mg/dL — ABNORMAL HIGH (ref 70–99)

## 2019-12-28 LAB — MAGNESIUM: Magnesium: 1.7 mg/dL (ref 1.7–2.4)

## 2019-12-28 LAB — SURGICAL PATHOLOGY

## 2019-12-28 MED ORDER — IOHEXOL 300 MG/ML  SOLN
75.0000 mL | Freq: Once | INTRAMUSCULAR | Status: AC | PRN
  Administered 2019-12-28: 75 mL via INTRAVENOUS

## 2019-12-28 MED ORDER — INSULIN GLARGINE 100 UNIT/ML ~~LOC~~ SOLN
25.0000 [IU] | Freq: Every day | SUBCUTANEOUS | Status: DC
  Administered 2019-12-29: 25 [IU] via SUBCUTANEOUS
  Filled 2019-12-28 (×4): qty 0.25

## 2019-12-28 MED ORDER — INSULIN ASPART 100 UNIT/ML ~~LOC~~ SOLN
8.0000 [IU] | Freq: Three times a day (TID) | SUBCUTANEOUS | Status: DC
  Administered 2019-12-28 – 2019-12-29 (×4): 8 [IU] via SUBCUTANEOUS

## 2019-12-28 NOTE — Progress Notes (Signed)
Inpatient Diabetes Program Recommendations  AACE/ADA: New Consensus Statement on Inpatient Glycemic Control   Target Ranges:  Prepandial:   less than 140 mg/dL      Peak postprandial:   less than 180 mg/dL (1-2 hours)      Critically ill patients:  140 - 180 mg/dL  Results for Pamela Knight, Pamela Knight (MRN [id number]) as of 12/28/2019 13:35  Ref. Range 12/27/2019 07:37 12/27/2019 11:21 12/27/2019 15:15 12/27/2019 16:57 12/27/2019 20:51 12/28/2019 07:49 12/28/2019 11:19  Glucose-Capillary Latest Ref Range: 70 - 99 mg/dL 198 (H) 203 (H) 202 (H) 163 (H) 203 (H) 283 (H) 288 (H)    Review of Glycemic Control  Diabetes history: DM2 Outpatient Diabetes medications: Lantus 60 units QHS, Novolog 3 units TID with meals, Novolog 0-15 units, Metformin 500 mg BID Current orders for Inpatient glycemic control: Lantus 20 units daily, Novolog 0-15 units TID with meals, Novolog 0-5 units QHS  Inpatient Diabetes Program Recommendations:    Insulin: Please consider increasing Lantus to 25 units daily and adding Novolog 5 units TID with meals for meal coverage if patient eats at least 50% of meals.  Thanks, Barnie Alderman, RN, MSN, CDE Diabetes Coordinator Inpatient Diabetes Program [phone] (Team Pager from 8am to 5pm)

## 2019-12-28 NOTE — Progress Notes (Signed)
Physical Therapy Treatment Patient Details Name: Pamela Knight MRN: [id number] DOB: [birth year] Today's Date: 12/28/2019    History of Present Illness Pamela Knight is a 59 y.o. female with medical history significant for coronary artery disease with history of CABG per patient, CHF, DM 2, hypertension, hyperlipidemia, depression, medication noncompliance who presents to the ED due to excessive thirst and increased urinary frequency.  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  She states that she has not been taking her insulin since she was discharged from the hospital, though she has been taking Metformin.  She has not been checking blood glucose level at home because she states that she does not have any means of checking it, she complained of some dizziness, excessive thirst and increased urinary frequency within last few days, so she decided to go to the ED for further evaluation and management.  Patient states that she is currently staying with a friend, she denies chest pain, shortness of breath, fever, chills, nausea, vomiting or abdominal pain.    PT Comments    Patient present in room in bed and agreeable to PT treatment. Able to complete bed mobility with improved form and endurance vs evaluation yesterday. Complete sit to stand transfer from EOB to ambulate to bathroom where complete stand pivot transfer and able to transfer on/off toilet and clean self with supervision. Ambulate throughout hospital with initial 1-2 min of ambulation demo increased sway and off balance moving into wall/PT, but resolved and demo improved and appropriate ambulation as ending session. Complete stand to sit transfer after stating she did not want to sit in the chair,  and completed bed mobility without assistance. Continue to assess initial balance difficulties for safety. RN notified of mobility status. Patient will benefit from continued physical therapy in hospital and recommended venue below to increase strength, balance, endurance for safe ADLs and gait.   Follow Up Recommendations  Home health PT;Supervision - Intermittent;Supervision for mobility/OOB;Outpatient PT     Equipment Recommendations  Rolling walker with 5" wheels    Recommendations for Other Services       Precautions / Restrictions Precautions Precautions: Fall Restrictions Weight Bearing Restrictions: No    Mobility  Bed Mobility Overal bed mobility: Modified Independent Bed Mobility: Supine to Sit;Sit to Supine     Supine to sit: Supervision Sit to supine: Supervision   General bed mobility comments: improved speed and mobility, HOB slightly elevated  Transfers Overall transfer level: Needs assistance Equipment used: Rolling walker (2 wheeled) Transfers: Sit to/from [facility] Sit to Stand: Supervision Stand pivot transfers: Min guard       General transfer comment: slow and labored, slightly unsteady  Ambulation/Gait Ambulation/Gait assistance: Min guard Gait Distance (Feet): 150 Feet Assistive device: None Gait Pattern/deviations: Step-through pattern;Decreased step length - right;Decreased step length - left;Drifts right/left Gait velocity: decreased   General Gait Details: slow and labored, unsteady for initial 50 ft then improve in balance, would benefit from cane or RW   Stairs             Wheelchair Mobility    Modified Rankin (Stroke Patients Only)       Balance Overall balance assessment: Mild deficits observed, not formally tested Sitting-balance support: Single extremity supported;Feet supported Sitting balance-Leahy Scale: Fair Sitting balance - Comments:  slightly unsteady, mostly safe   Standing balance  support: No upper extremity supported;During functional activity Standing balance-Leahy Scale: Fair Standing balance comment: fair to poor balance, unsteady during first 50 ft ambulation then improve, sway B, pref to use wall or railing for support.                            Cognition Arousal/Alertness: Awake/alert Behavior During Therapy: WFL for tasks assessed/performed Overall Cognitive Status: Within Functional Limits for tasks assessed                                        Exercises      General Comments        Pertinent Vitals/Pain Pain Assessment: Faces Faces Pain Scale: Hurts a little bit    Home Living                      Prior Function            PT Goals (current goals can now be found in the care plan section) Acute Rehab PT Goals Patient Stated Goal: return home PT Goal Formulation: With patient Time For Goal Achievement: 01/10/20 Potential to Achieve Goals: Good Progress towards PT goals: Progressing toward goals    Frequency    Min 3X/week      PT Plan Current plan remains appropriate    Co-evaluation              AM-PAC PT "6 Clicks" Mobility   Outcome Measure  Help needed turning from your back to your side while in a flat bed without using bedrails?: None Help needed moving from lying on your back to sitting on the side of a flat bed without using bedrails?: None Help needed moving to and from a bed to a chair (including a wheelchair)?: A Little Help needed standing up from a chair using your arms (e.g., wheelchair or bedside chair)?: A Little Help needed to walk in hospital room?: A Little Help needed climbing 3-5 steps with a railing? : A Little 6 Click Score: 20    End of Session   Activity Tolerance: Patient tolerated treatment well Patient left: in bed;with call bell/phone within reach Nurse Communication: Mobility status PT Visit Diagnosis: Unsteadiness on feet (R26.81);Other  abnormalities of gait and mobility (R26.89);Muscle weakness (generalized) (M62.81)     Time: 1030-1040 PT Time Calculation (min) (ACUTE ONLY): 10 min  Charges:  $Gait Training: 8-22 mins                     11:28 AM,12/28/19 Domenic Moras, PT, DPT Physical Therapist at [facility]

## 2019-12-28 NOTE — Progress Notes (Signed)
PROGRESS NOTE   ELICA ZOLL  [id number] DOB: February 16, 1960 DOA: 12/25/2019 PCP: Patient, No Pcp Per   Chief Complaint  Patient presents with  . Hyperglycemia    Brief Admission History:  59 y.o. female with medical history significant for coronary artery disease with history of CABG per patient, CHF, DM 2, hypertension, hyperlipidemia, depression, medication noncompliance who presents to the ED due to excessive thirst and increased urinary frequency.  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  She states that she has not been taking her insulin since she was discharged from the hospital, though she has been taking Metformin.  She has not been checking blood glucose level at home because she states that she does not have any means of checking it, she complained of some dizziness, excessive thirst and increased urinary frequency within last few days, so she decided to go to the ED for further evaluation and management.  Patient states that she is currently staying with a friend, she denies chest pain, shortness of breath, fever, chills, nausea, vomiting or abdominal pain.  ED Course: In the emergency department, she was hemodynamically stable.  Work-up in the ED showed hyperglycemia, hyponatremia, lactic acid 2.7, beta hydroxybutyrate  0.15, CRP 4.3, sed rate 42.  Left foot x-ray showed prior surgical limitation of the left great toe with soft tissue swelling seen at the surgical site.  She was started on IV insulin drip per HHS Endo tool.  Hospitalist was asked to admit patient for further evaluation and management.  Assessment & Plan:   Active Problems:   Essential hypertension, benign   Hypercholesteremia   Personal history of noncompliance with  medical treatment, presenting hazards to health   Hyperosmolar hyperglycemic state (HHS) ([place])   Hyperglycemia due to diabetes mellitus ([place])   Lactic acidosis   Dehydration   Pseudohyponatremia   Uncontrolled type 2 diabetes mellitus with hyperglycemia, with long-term current use of insulin (HCC)   Heme positive stool   Hyponatremia   Constipation   RLQ abdominal pain   Hyperosmolar (nonketotic) coma (HCC)   Rectal mass   1. HHS - blood sugars have improved and patient is now off IV insulin and back on subcutaneous insulin.  Continue to follow CBG closely and continue current management. Agree with diabetes coordinator will increase lantus and add prandial coverage for meals eaten more than 50%.  2. Lactic acidosis - resolved now after hydration.  3. Rectal bleeding -Pt s/p colonoscopy and awaiting biopsy results. CT chest is pending to check for metastatic lesions.  4. Chronic diastolic heart failure - stable.   5. Poor compliance - ongoing counseling.   6. HLD - resume home atorvastatin when able to take orals.  7. CAD - no chest pain symptoms- resume home plavix when able.  8. Essential hypertension  - stable resumed home metoprolol.   DVT prophylaxis:  SCDs Code Status:  Full  Family Communication:   Disposition: home   Status is: Inpatient  Remains inpatient appropriate because:IV treatments appropriate due to intensity of illness or inability to take PO and Inpatient level of care appropriate due to severity of illness   Dispo: The patient is from: Home              Anticipated d/c is to: Home  Anticipated d/c date is: 1 day              Patient currently is not medically stable to d/c.  Consultants:   GI  Procedures:   Colonoscopy 12/22   Antimicrobials:  n/a  Subjective: Pt has no specific complaints. She tolerated colonoscopy well.      Objective: Vitals:   12/27/19 1415 12/27/19 2204 12/28/19 0449 12/28/19 1145  BP: 128/68 (!) 149/74 (!)  124/58 136/68  Pulse: (!) 59 65 97 70  Resp: 15 20 19 18   Temp:  98.2 F (36.8 C) 98.2 F (36.8 C) 97.8 F (36.6 C)  TempSrc:  Oral  Oral  SpO2: 99% 97% 98% 98%  Weight:      Height:        Intake/Output Summary (Last 24 hours) at 12/28/2019 1522 Last data filed at 12/27/2019 1900 Gross per 24 hour  Intake 600 ml  Output --  Net 600 ml   Filed Weights   12/25/19 2327 12/26/19 1015  Weight: 81.7 kg 81.5 kg   Examination:  General exam: Appears calm and comfortable  Respiratory system: Clear to auscultation. Respiratory effort normal. Cardiovascular system: S1 & S2 heard, RRR. No JVD, murmurs, rubs, gallops or clicks. No pedal edema. Gastrointestinal system: Abdomen is nondistended, soft and nontender. No organomegaly or masses felt. Normal bowel sounds heard. Central nervous system: Alert and oriented. No focal neurological deficits. Extremities: Symmetric 5 x 5 power. Skin: No rashes, lesions or ulcers Psychiatry: Judgement and insight appear normal. Mood & affect appropriate.   Data Reviewed: I have personally reviewed following labs and imaging studies  CBC: Recent Labs  Lab 12/26/19 0058 12/26/19 0710 12/27/19 0527  WBC 9.0 9.0 6.4  HGB 12.6 13.9  13.8 11.4*  HCT 37.0 41.6  41.7 34.9*  MCV 90.7 93.1 92.8  PLT 275 271 [id number]    Basic Metabolic Panel: Recent Labs  Lab 12/26/19 0321 12/26/19 0710 12/26/19 0841 12/27/19 0527 12/28/19 1017  NA 129* 122*  134* 135 135 134*  K 3.5 4.6  3.8 3.6 4.3 4.3  CL 94* 85*  101 101 106 101  CO2 26 27  23 26 23 25   GLUCOSE 517* 837*  254* 170* 223* 359*  BUN 20 23  18 17 10 8   CREATININE 0.93 1.02*  0.72 0.69 0.57 0.71  CALCIUM 8.7* 9.2  9.1 8.7* 8.5* 8.3*  MG  --   --   --  1.5* 1.7    GFR: Estimated Creatinine Clearance: 73.1 mL/min (by C-G formula based on SCr of 0.71 mg/dL).  Liver Function Tests: Recent Labs  Lab 12/26/19 0058  AST 13*  ALT 14  ALKPHOS 96  BILITOT 0.4  PROT 6.9  ALBUMIN  3.6    CBG: Recent Labs  Lab 12/27/19 1515 12/27/19 1657 12/27/19 2051 12/28/19 0749 12/28/19 1119  GLUCAP 202* 163* 203* 283* 288*    Recent Results (from the past 240 hour(s))  Resp Panel by RT-PCR (Flu A&B, Covid) Nasopharyngeal Swab     Status: None   Collection Time: 12/26/19  1:49 AM   Specimen: Nasopharyngeal Swab; Nasopharyngeal(NP) swabs in vial transport medium  Result Value Ref Range Status   SARS Coronavirus 2 by RT PCR NEGATIVE NEGATIVE Final    Comment: (NOTE) SARS-CoV-2 target nucleic acids are NOT DETECTED.  The SARS-CoV-2 RNA is generally detectable in upper respiratory specimens during the acute phase of infection. The lowest concentration of SARS-CoV-2 viral copies this assay can detect is 138  copies/mL. A negative result does not preclude SARS-Cov-2 infection and should not be used as the sole basis for treatment or other patient management decisions. A negative result may occur with  improper specimen collection/handling, submission of specimen other than nasopharyngeal swab, presence of viral mutation(s) within the areas targeted by this assay, and inadequate number of viral copies(<138 copies/mL). A negative result must be combined with clinical observations, patient history, and epidemiological information. The expected result is Negative.  Fact Sheet for Patients:  EntrepreneurPulse.com.au  Fact Sheet for Healthcare Providers:  IncredibleEmployment.be  This test is no t yet approved or cleared by the [place] FDA and  has been authorized for detection and/or diagnosis of SARS-CoV-2 by FDA under an Emergency Use Authorization (EUA). This EUA will remain  in effect (meaning this test can be used) for the duration of the COVID-19 declaration under Section 564(b)(1) of the Act, 21 U.S.C.section 360bbb-3(b)(1), unless the authorization is terminated  or revoked sooner.       Influenza A by PCR NEGATIVE  NEGATIVE Final   Influenza B by PCR NEGATIVE NEGATIVE Final    Comment: (NOTE) The Xpert Xpress SARS-CoV-2/FLU/RSV plus assay is intended as an aid in the diagnosis of influenza from Nasopharyngeal swab specimens and should not be used as a sole basis for treatment. Nasal washings and aspirates are unacceptable for Xpert Xpress SARS-CoV-2/FLU/RSV testing.  Fact Sheet for Patients: EntrepreneurPulse.com.au  Fact Sheet for Healthcare Providers: IncredibleEmployment.be  This test is not yet approved or cleared by the [place] FDA and has been authorized for detection and/or diagnosis of SARS-CoV-2 by FDA under an Emergency Use Authorization (EUA). This EUA will remain in effect (meaning this test can be used) for the duration of the COVID-19 declaration under Section 564(b)(1) of the Act, 21 U.S.C. section 360bbb-3(b)(1), unless the authorization is terminated or revoked.  Performed at [facility], [address] [place], [place] [address]      Radiology Studies: CT CHEST W CONTRAST  Result Date: 12/28/2019 CLINICAL DATA:  Rectal mass suspicious for malignancy. Colorectal cancer staging. EXAM: CT CHEST WITH CONTRAST TECHNIQUE: Multidetector CT imaging of the chest was performed during intravenous contrast administration. CONTRAST:  33mL OMNIPAQUE IOHEXOL 300 MG/ML  SOLN COMPARISON:  Abdominopelvic CT 12/26/2019. Chest radiographs 11/15/2019. FINDINGS: Cardiovascular: No acute vascular findings. Mild aortic and coronary artery atherosclerosis with probable coronary artery stents. Probable previous mini median sternotomy. There is mild central enlargement of the pulmonary arteries suspicious for pulmonary arterial hypertension. The heart size is normal. There is no pericardial effusion. Mediastinum/Nodes: There are no enlarged mediastinal, hilar or axillary lymph nodes. Mild dilatation of the distal esophagus with associated wall thickening. The  trachea and thyroid gland demonstrate no significant findings. Lungs/Pleura: There is no pleural effusion. There is scattered linear scarring in both lungs, left greater than right. There is a small calcified left lower lobe granuloma. No suspicious pulmonary nodules. Upper abdomen: The visualized upper abdomen appears stable without acute findings. Musculoskeletal/Chest wall: There is no chest wall mass or suspicious osseous finding. Postsurgical changes in the sternum and upper abdominal wall. IMPRESSION: 1. No evidence of metastatic disease within the chest. 2. Mild central enlargement of the pulmonary arteries suspicious for pulmonary arterial hypertension. 3. Mild dilatation of the distal esophagus with associated wall thickening. 4. Aortic Atherosclerosis (ICD10-I70.0). Electronically Signed   By: Richardean Sale M.D.   On: 12/28/2019 15:06   Scheduled Meds: . atorvastatin  40 mg Oral Daily  . Chlorhexidine Gluconate Cloth  6  each Topical Daily  . fenofibrate  160 mg Oral Daily  . gabapentin  400 mg Oral TID  . hydrocortisone  25 mg Rectal BID  . insulin aspart  0-15 Units Subcutaneous TID WC  . insulin aspart  0-5 Units Subcutaneous QHS  . insulin glargine  20 Units Subcutaneous Daily  . metoprolol tartrate  25 mg Oral BID  . pantoprazole  40 mg Oral BID AC  . polyethylene glycol  17 g Oral Daily  . sertraline  100 mg Oral Daily  . vitamin B-12  500 mcg Oral Daily   Continuous Infusions: . 0.9 % NaCl with KCl 20 mEq / L 125 mL/hr at 12/27/19 1155     LOS: 1 day   Time spent: 35 minutes    Matai Carpenito Wynetta Emery, MD How to contact the [facility] Attending or Consulting provider [place] or covering provider during after hours [place], for this patient?  1. Check the care team in [facility] and look for a) attending/consulting TRH provider listed and b) the [facility] team listed 2. Log into [email or web address] and use [place]'s universal password to access. If you do not have the password, please contact the  hospital operator. 3. Locate the [facility] provider you are looking for under [facility] and page to a number that you can be directly reached. 4. If you still have difficulty reaching the provider, please page the [facility] (Director on Call) for the Hospitalists listed on amion for assistance.  12/28/2019, 3:22 PM

## 2019-12-28 NOTE — Progress Notes (Signed)
Per Dr. 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   Await chest CT.  Laureen Ochs. Bernarda Caffey [facility] [phone] 12/23/20212:05 PM

## 2019-12-28 NOTE — Progress Notes (Signed)
Subjective:  Patient without complaints. Feels hungry.  Objective: Vital signs in last 24 hours: Temp:  [97.5 F (36.4 C)-98.2 F (36.8 C)] 98.2 F (36.8 C) (12/23 0449) Pulse Rate:  [58-97] 97 (12/23 0449) Resp:  [11-20] 19 (12/23 0449) BP: (106-149)/(58-83) 124/58 (12/23 0449) SpO2:  [97 %-100 %] 98 % (12/23 0449) Last BM Date: 12/27/19 General:   Alert,  Well-developed, well-nourished, pleasant and cooperative in NAD Head:  Normocephalic and atraumatic. Eyes:  Sclera clear, no icterus.  Abdomen:  Soft, nontender and nondistended.   Normal bowel sounds, without guarding, and without rebound.   Extremities:  Without clubbing, deformity or edema. Neurologic:  Alert and  oriented x4;  grossly normal neurologically. Skin:  Intact without significant lesions or rashes. Psych:  Alert and cooperative. Normal mood and affect.  Intake/Output from previous day: 12/22 0701 - 12/23 0700 In: 1000 [P.O.:600; I.V.:400] Out: -  Intake/Output this shift: No intake/output data recorded.  Lab Results: CBC Recent Labs    12/26/19 0058 12/26/19 0710 12/27/19 0527  WBC 9.0 9.0 6.4  HGB 12.6 13.9  13.8 11.4*  HCT 37.0 41.6  41.7 34.9*  MCV 90.7 93.1 92.8  PLT 275 271 250   BMET Recent Labs    12/26/19 0710 12/26/19 0841 12/27/19 0527  NA 122*  134* 135 135  K 4.6  3.8 3.6 4.3  CL 85*  101 101 106  CO2 27  23 26 23   GLUCOSE 837*  254* 170* 223*  BUN 23  18 17 10   CREATININE 1.02*  0.72 0.69 0.57  CALCIUM 9.2  9.1 8.7* 8.5*   LFTs Recent Labs    12/26/19 0058  BILITOT 0.4  ALKPHOS 96  AST 13*  ALT 14  PROT 6.9  ALBUMIN 3.6   No results for input(s): LIPASE in the last 72 hours. PT/INR No results for input(s): LABPROT, INR in the last 72 hours.    Imaging Studies: CT ABDOMEN PELVIS W CONTRAST  Result Date: 12/26/2019 CLINICAL DATA:  Lower abdominal pain and heme-positive stool EXAM: CT ABDOMEN AND PELVIS WITH CONTRAST TECHNIQUE: Multidetector CT imaging  of the abdomen and pelvis was performed using the standard protocol following bolus administration of intravenous contrast. CONTRAST:  144mL OMNIPAQUE IOHEXOL 300 MG/ML  SOLN COMPARISON:  None. FINDINGS: Lower chest: There is mild bibasilar atelectasis. There is no lung base edema or consolidation. Hepatobiliary: No apparent focal liver lesions. Gallbladder is absent. Borderline intrahepatic biliary duct dilatation is likely a consequence of post cholecystectomy state. Common hepatic duct is prominent measuring 12 mm with tapering of the common bile duct distally. No biliary duct mass or calculus is evident on CT examination. Pancreas: No pancreatic mass or inflammatory focus. Pancreatic duct upper normal in size. Spleen: There is a 5 mm apparent hemangioma in the spleen anteriorly. No other splenic lesions are evident. Adrenals/Urinary Tract: Adrenals bilaterally appear normal. There is no appreciable renal mass or hydronephrosis on either side. No evident renal or ureteral calculus on either side. The urinary bladder is midline with wall thickness within normal limits. Stomach/Bowel: There is moderate stool in the colon. There is no appreciable diverticular disease. No bowel wall or mesenteric thickening. There is a focal ventral hernia slightly to the left of midline at the level of the superior aspect of the iliac crests. This hernia contains fat as well as a loop of transverse colon. No bowel compromise evident. This hernia measures 3.6 cm from right to left dimension at its neck and 2.0 cm  from superior to inferior dimension at its neck. A second ventral hernia containing a loop of colon as well as fat is noted in the mid abdominal region. There is evidence of previous hernia repair in this area. No bowel compromise evident in this region. There is generalized rectus muscle thinning in this area. Elsewhere, there is no bowel wall thickening or bowel obstruction. Terminal ileum appears unremarkable. There is no  evident free air or portal venous air. Vascular/Lymphatic: There is no abdominal aortic aneurysm. There is aortic and iliac artery atherosclerotic calcification. Major venous structures appear patent. There is no evident adenopathy in the abdomen or pelvis. Reproductive: Uterus is anteverted. Uterus appears somewhat atrophic. No adnexal masses. Other: Appendix appears normal. Ventral hernias noted above. No abscess or ascites evident in the abdomen or pelvis. Musculoskeletal: There is degenerative change in the lumbar spine. There are no blastic or lytic bone lesions. No intramuscular lesions are evident. IMPRESSION: 1. Midline ventral hernia with second ventral hernia slightly more inferiorly slightly to the left of midline, both containing loops of transverse colon as well as fat. No bowel compromise. Postoperative change at the site of the midline ventral hernia. 2. Moderate stool in colon. No bowel wall thickening or bowel obstruction. No evident diverticular disease. 3.  Appendix appears normal.  No abscess in the abdomen or pelvis. 4. Gallbladder absent. Mild intrahepatic duct dilatation and proximal common hepatic duct prominence, likely consequences of post cholecystectomy state. 5.  Aortic Atherosclerosis (ICD10-I70.0). Electronically Signed   By: Lowella Grip III M.D.   On: 12/26/2019 10:11   DG Foot Complete Left  Result Date: 12/26/2019 CLINICAL DATA:  Status post left great toe amputation and subsequent swelling in the area. EXAM: LEFT FOOT - COMPLETE 3+ VIEW COMPARISON:  November 15, 2019 FINDINGS: There is prior surgical amputation of the left great toe. There is no evidence of acute fracture or dislocation. There is no evidence of significant arthropathy. A small plantar calcaneal spur is noted. Mild to moderate severity soft tissue swelling is seen along the site of prior left great toe amputation no soft tissue air is seen. IMPRESSION: 1. Prior surgical amputation of the left great toe  with soft tissue swelling seen at the surgical site. Electronically Signed   By: Virgina Norfolk M.D.   On: 12/26/2019 01:20  [2 weeks]   Assessment: 59 year old female with history of CAD status post CABG on Plavix and aspirin, CHF, diabetes, hypertension, hyperlipidemia, depression, medical noncompliance, homelessness who presented to the emergency department December 20 due to excessive thirst and increased urination found to have hyperosmolar hyperglycemia with glucose greater than 600.  GI was consulted for heme positive stool.  Rectal bleeding: 72-month history of intermittent rectal bleeding associated with straining with BMs, rectal pain.  On rectal exam she had nonthrombosed external hemorrhoid, no obvious anal fissure.  Internal rectal exam with nodular mucosa. CT showed rectal wall thickening. Colonoscopy 12/22: Palpable rectal flat lesion with raised margin on DRE, 2 small polyps in the transverse colon and cecum, distal ulcerated lesion (30 x 40 mm) with raised margins in the distal rectum extending into dentate line concerning for malignant process.  Right lower quadrant pain: In the setting of constipation, rectal lesion.  CT showing midline ventral hernia with second ventral hernia slightly more inferiorly slightly to the left of midline, both containing loops of transverse colon and fat. Denies abdominal pain today.    Plan: 1. Follow-up pending pathology. 2. Continue MiraLAX 17 g daily. 3. Chest  CT with contrast. Discussed with Dr. Laural Golden. 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   Laureen Ochs. Bernarda Caffey [facility] [phone] 12/23/20219:14 AM     LOS: 1 day

## 2019-12-29 DIAGNOSIS — K5900 Constipation, unspecified: Secondary | ICD-10-CM | POA: Diagnosis not present

## 2019-12-29 DIAGNOSIS — R531 Weakness: Secondary | ICD-10-CM | POA: Diagnosis not present

## 2019-12-29 DIAGNOSIS — I10 Essential (primary) hypertension: Secondary | ICD-10-CM | POA: Diagnosis not present

## 2019-12-29 DIAGNOSIS — E860 Dehydration: Secondary | ICD-10-CM | POA: Diagnosis not present

## 2019-12-29 LAB — CBC
HCT: 34.7 % — ABNORMAL LOW (ref 36.0–46.0)
Hemoglobin: 11.5 g/dL — ABNORMAL LOW (ref 12.0–15.0)
MCH: 31 pg (ref 26.0–34.0)
MCHC: 33.1 g/dL (ref 30.0–36.0)
MCV: 93.5 fL (ref 80.0–100.0)
Platelets: 252 10*3/uL (ref 150–400)
RBC: 3.71 MIL/uL — ABNORMAL LOW (ref 3.87–5.11)
RDW: 13.6 % (ref 11.5–15.5)
WBC: 7.3 10*3/uL (ref 4.0–10.5)
nRBC: 0 % (ref 0.0–0.2)

## 2019-12-29 LAB — COMPREHENSIVE METABOLIC PANEL
ALT: 11 U/L (ref 0–44)
AST: 14 U/L — ABNORMAL LOW (ref 15–41)
Albumin: 2.9 g/dL — ABNORMAL LOW (ref 3.5–5.0)
Alkaline Phosphatase: 61 U/L (ref 38–126)
Anion gap: 9 (ref 5–15)
BUN: 8 mg/dL (ref 8–23)
CO2: 23 mmol/L (ref 22–32)
Calcium: 8.5 mg/dL — ABNORMAL LOW (ref 8.9–10.3)
Chloride: 104 mmol/L (ref 98–111)
Creatinine, Ser: 0.75 mg/dL (ref 0.44–1.00)
GFR, Estimated: >60 mL/min (ref >60)
Glucose, Bld: 242 mg/dL — ABNORMAL HIGH (ref 70–99)
Potassium: 3.8 mmol/L (ref 3.5–5.1)
Sodium: 136 mmol/L (ref 135–145)
Total Bilirubin: 0.5 mg/dL (ref 0.3–1.2)
Total Protein: 5.7 g/dL — ABNORMAL LOW (ref 6.5–8.1)

## 2019-12-29 LAB — GLUCOSE, CAPILLARY
Glucose-Capillary: 257 mg/dL — ABNORMAL HIGH (ref 70–99)
Glucose-Capillary: 316 mg/dL — ABNORMAL HIGH (ref 70–99)
Glucose-Capillary: 191 mg/dL — ABNORMAL HIGH (ref 70–99)

## 2019-12-29 LAB — MAGNESIUM: Magnesium: 1.7 mg/dL (ref 1.7–2.4)

## 2019-12-29 MED ORDER — PANTOPRAZOLE SODIUM 40 MG PO TBEC: 40.0000 mg | DELAYED_RELEASE_TABLET | Freq: Two times a day (BID) | ORAL | 1 refills | Status: DC

## 2019-12-29 MED ORDER — HYDROCORTISONE ACETATE 25 MG RE SUPP: 25.0000 mg | Freq: Two times a day (BID) | RECTAL | 1 refills | Status: DC

## 2019-12-29 MED ORDER — METOPROLOL TARTRATE 25 MG PO TABS: 25.0000 mg | ORAL_TABLET | Freq: Two times a day (BID) | ORAL | 1 refills | Status: DC

## 2019-12-29 MED ORDER — POLYETHYLENE GLYCOL 3350 17 G PO PACK
17.0000 g | PACK | Freq: Every day | ORAL | 1 refills | Status: DC
Start: 2019-12-30 — End: 2020-01-02

## 2019-12-29 NOTE — TOC Progression Note (Signed)
Transition of Care [facility]) - Progression Note    Patient Details  Name: Pamela Knight MRN: [id number] Date of Birth: Nov 27, 1960  Transition of Care [facility]) CM/SW [place], [place] Phone Number: 12/29/2019, 1:29 PM  Clinical Narrative:    CSW reached out to [place] [facility] and [place] [facility] to make HHPT referral. [facility] states they will be unable to check pts insurance until 12/28. [facility] is closed until 12/28. CSW unable to make HHPT referral for pt. CSW updated MD of inability to make HHPT referral due to all companies being closed. CSW informed by RN that pt wanted to speak with CSW. CSW made call to pt, pt states she is homeless. Pt asked for CSW to call back at 2pm. CSW to call back and provide pt with housing authority information. TOC to follow.    Expected Discharge Plan: Home/Self Care Barriers to Discharge: Continued Medical Work up  Expected Discharge Plan and Services Expected Discharge Plan: Home/Self Care In-house Referral: Clinical Social Work   Post Acute Care Choice: NA Living arrangements for the past 2 months: Homeless (Currently living with her friend, Pamela Knight)                 DME Arranged: N/A DME Agency: NA       HH Arranged: NA HH Agency: NA         Social Determinants of Health (SDOH) Interventions    Readmission Risk Interventions No flowsheet data found.

## 2019-12-29 NOTE — TOC Transition Note (Signed)
Transition of Care [facility]) - CM/SW Discharge Note   Patient Details  Name: Pamela Knight MRN: [id number] Date of Birth: Oct 22, 1960  [facility] Care [facility]) CM/SW Contact:  Iona Beard, [place] Phone Number: 12/29/2019, 4:21 PM   Clinical Narrative:    CSW informed by RN that pt wanted to speak to CSW. CSW spoke with pt. Pt wanting housing due to being homeless and living with friends. CSW informed pt that CSW could provide information for a homeless shelter in [place], [place], or [place]. Pt states she only wants information for [employer]. CSW provided pt with phone number and address, CSW informed that they may or may not be able to provide resources but to give them a call. TOC signing off.    Final next level of care: Home/Self Care Barriers to Discharge: Barriers Resolved   Patient Goals and CMS Choice Patient states their goals for this hospitalization and ongoing recovery are:: Return to friends home CMS Medicare.gov Compare Post Acute Care list provided to:: Patient Choice offered to / list presented to : NA  Discharge Placement                       Discharge Plan and Services In-house Referral: Clinical Social Work   Post Acute Care Choice: NA          DME Arranged: N/A DME Agency: NA       HH Arranged: NA HH Agency: NA        Social Determinants of Health (SDOH) Interventions     Readmission Risk Interventions No flowsheet data found.

## 2019-12-29 NOTE — Progress Notes (Signed)
Physical Therapy Treatment Patient Details Name: Pamela Knight MRN: [id number] DOB: 20-Oct-1960 Today's Date: 12/29/2019    History of Present Illness Pamela Knight is a 59 y.o. female with medical history significant for coronary artery disease with history of CABG per patient, CHF, DM 2, hypertension, hyperlipidemia, depression, medication noncompliance who presents to the ED due to excessive thirst and increased urinary frequency.  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  She states that she has not been taking her insulin since she was discharged from the hospital, though she has been taking Metformin.  She has not been checking blood glucose level at home because she states that she does not have any means of checking it, she complained of some dizziness, excessive thirst and increased urinary frequency within last few days, so she decided to go to the ED for further evaluation and management.  Patient states that she is currently staying with a friend, she denies chest pain, shortness of breath, fever, chills, nausea, vomiting or abdominal pain.    PT Comments    Patient present in bed and agreeable to PT. Able to complete all bed mobility safely and efficiently. Able to don/doff shoes, but demo decreased overhead reaching to tie her gown.  Complete sit to stand transfer from EOB with supervision-ModInd and completed static standing balance without sway. Initial ambulation balance improved vs yesterdays session, demoing fewer sways throughout and remaining safe throughout ambulation. Nurse notified of position and ambulation status. Patient will benefit from continued physical therapy in hospital and recommended venue below to increase strength,  balance, endurance for safe ADLs and gait.   Follow Up Recommendations  Home health PT;Supervision - Intermittent;Supervision for mobility/OOB;Outpatient PT     Equipment Recommendations  Rolling walker with 5" wheels    Recommendations for Other Services       Precautions / Restrictions Precautions Precautions: None Restrictions Weight Bearing Restrictions: No    Mobility  Bed Mobility Overal bed mobility: Modified Independent Bed Mobility: Supine to Sit     Supine to sit: Modified independent (Device/Increase time)     General bed mobility comments: appropriate speed and mobility, HOB flat  Transfers Overall transfer level: Modified independent   Transfers: Sit to/from Stand Sit to Stand: Modified independent (Device/Increase time)         General transfer comment: improved steadiness and balance  Ambulation/Gait Ambulation/Gait assistance: Supervision Gait Distance (Feet): 150 Feet Assistive device: None Gait Pattern/deviations: Step-through pattern;Decreased step length - right;Decreased step length - left;Decreased dorsiflexion - left Gait velocity: decreased   General Gait Details: improved initial balance and remained steady with decreased drifting throughout   Stairs             Wheelchair Mobility    Modified Rankin (Stroke Patients Only)       Balance Overall balance assessment: Mild deficits observed, not formally tested Sitting-balance support: Feet supported;No upper extremity supported Sitting balance-Leahy Scale: Good Sitting balance - Comments: EOB   Standing balance support: No upper extremity supported;During functional activity Standing balance-Leahy Scale: Fair Standing balance comment: improved balance during ambulation with 3-5 sways during ambulation throughout hallways, decreasing vs previous days session  Cognition Arousal/Alertness: Awake/alert Behavior During Therapy: WFL for  tasks assessed/performed Overall Cognitive Status: Within Functional Limits for tasks assessed                                        Exercises      General Comments        Pertinent Vitals/Pain Pain Assessment: No/denies pain    Home Living                      Prior Function            PT Goals (current goals can now be found in the care plan section) Acute Rehab PT Goals Patient Stated Goal: return home PT Goal Formulation: With patient Time For Goal Achievement: 01/10/20 Potential to Achieve Goals: Good Progress towards PT goals: Progressing toward goals    Frequency    Min 3X/week      PT Plan Current plan remains appropriate    Co-evaluation              AM-PAC PT "6 Clicks" Mobility   Outcome Measure  Help needed turning from your back to your side while in a flat bed without using bedrails?: None Help needed moving from lying on your back to sitting on the side of a flat bed without using bedrails?: None Help needed moving to and from a bed to a chair (including a wheelchair)?: None Help needed standing up from a chair using your arms (e.g., wheelchair or bedside chair)?: None Help needed to walk in hospital room?: A Little Help needed climbing 3-5 steps with a railing? : A Little 6 Click Score: 22    End of Session   Activity Tolerance: Patient tolerated treatment well Patient left: in bed;with call bell/phone within reach Nurse Communication: Mobility status PT Visit Diagnosis: Unsteadiness on feet (R26.81);Other abnormalities of gait and mobility (R26.89);Muscle weakness (generalized) (M62.81)     Time: 8938-1017 PT Time Calculation (min) (ACUTE ONLY): 8 min  Charges:  $Gait Training: 8-22 mins                     8:40 AM,12/29/19 Domenic Moras, PT, DPT Physical Therapist at [facility]

## 2019-12-29 NOTE — Discharge Summary (Signed)
Physician Discharge Summary  Pamela Knight [id number] DOB: [birth year] DOA: 12/25/2019  PCP: Patient, No Pcp Per  Admit date: 12/25/2019 Discharge date: 12/29/2019  Admitted From:  Home  Disposition:  Home with [facility]  Recommendations for Outpatient Follow-up:  1. Follow up with PCP in 1 weeks 2. Establish care with oncology in 2 weeks  Home Health: RN, PT   Discharge Condition: STABLE   CODE STATUS: FULL    Brief Hospitalization Summary: Please see all hospital notes, images, labs for full details of the hospitalization ADMISSION HPI: Pamela Knight is a 59 y.o. female with medical history significant for coronary artery disease with history of CABG per patient, CHF, DM 2, hypertension, hyperlipidemia, depression, medication noncompliance who presents to the ED due to excessive thirst and increased urinary frequency. 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 She states that she has not been taking her insulin since she was discharged from the hospital, though she has been taking Metformin.  She has not been checking blood glucose level at home because she states that she does not have any means of checking it, she complained of some dizziness, excessive thirst and increased urinary frequency within last few days, so she decided to go to the ED for further evaluation and management. Patient states that she is currently staying with a friend, she denies chest pain, shortness of breath, fever, chills, nausea, vomiting or abdominal pain.  ED Course: In the emergency department, she was hemodynamically stable.  Work-up in the ED showed hyperglycemia, hyponatremia, lactic acid 2.7, beta hydroxybutyrate  0.15, CRP 4.3, sed rate 42.  Left foot x-ray showed  prior surgical limitation of the left great toe with soft tissue swelling seen at the surgical site.  She was started on IV insulin drip per HHS Endo tool.  Hospitalist was asked to admit patient for further evaluation and management.  Hospital Course   1. HHS - blood sugars have improved and patient is now off IV insulin and back on subcutaneous insulin.  Continue to follow CBG closely and continue current management. Resume home diabetes treatment regimen.  2. Lactic acidosis - resolved now after hydration.  3. 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 CT chest is negative for metastatic lesions.  Outpatient referral to oncology made.  Patient would like to see Dr. Delton Coombes.  I would like for her to be seen in a couple weeks. 4. Chronic diastolic heart failure - stable.   5. Poor compliance - ongoing counseling.   6. HLD - resume home atorvastatin when able to take orals.  7. CAD - no chest pain symptoms- resume home plavix when able.  8. Essential hypertension  - stable resumed home metoprolol.   DVT prophylaxis:  SCDs Code Status:  Full  Family Communication:   Patient declined Disposition: home    Discharge Diagnoses:  Active Problems:   Essential hypertension, benign   Hypercholesteremia   Personal history of noncompliance with medical treatment, presenting hazards to health   Hyperosmolar hyperglycemic state (HHS) ([place])   Hyperglycemia due to diabetes mellitus ([place])   Lactic acidosis   Dehydration   Pseudohyponatremia   Uncontrolled type 2 diabetes mellitus with hyperglycemia, with long-term current use of insulin (HCC)   Heme positive  stool   Hyponatremia   Constipation   RLQ abdominal pain   Hyperosmolar (nonketotic) coma (HCC)   Rectal mass   Generalized weakness   Discharge Instructions: Discharge Instructions    Ambulatory referral to Hematology / Oncology   Complete by: As directed     Ambulatory referral to Physical Therapy   Complete by: As directed      Allergies as of 12/29/2019      Reactions   Avandia [rosiglitazone] Other (See Comments)   unk   Niacin And Related Other (See Comments)   unk      Medication List    STOP taking these medications   hydrocortisone 2.5 % rectal cream Commonly known as: ANUSOL-HC   lamoTRIgine 100 MG tablet Commonly known as: LAMICTAL     TAKE these medications   aspirin EC 81 MG tablet Take 1 tablet (81 mg total) by mouth daily.   atorvastatin 40 MG tablet Commonly known as: LIPITOR Take 1 tablet (40 mg total) by mouth daily.   CENTRUM SILVER PO Take by mouth.   clopidogrel 75 MG tablet Commonly known as: PLAVIX Take 1 tablet (75 mg total) by mouth daily.   fenofibrate 145 MG tablet Commonly known as: TRICOR Take 1 tablet (145 mg total) by mouth daily.   gabapentin 800 MG tablet Commonly known as: NEURONTIN Take 1 tablet (800 mg total) by mouth 3 (three) times daily.   hydrocortisone 25 MG suppository Commonly known as: ANUSOL-HC Place 1 suppository (25 mg total) rectally 2 (two) times daily.   insulin aspart 100 UNIT/ML injection Commonly known as: novoLOG Inject 3 Units into the skin 3 (three) times daily with meals.   insulin aspart 100 UNIT/ML injection Commonly known as: novoLOG CBG 70 - 120: 0 units CBG 121 - 150: 2 units CBG 151 - 200: 3 units CBG 201 - 250: 5 units CBG 251 - 300: 8 units CBG 301 - 350: 11 units CBG 351 - 400: 15 units   insulin glargine 100 UNIT/ML injection Commonly known as: LANTUS Inject 0.6 mLs (60 Units total) into the skin at bedtime.   metFORMIN 500 MG tablet Commonly known as: GLUCOPHAGE Take 1 tablet (500 mg total) by mouth 2 (two) times daily with a meal.   metoprolol tartrate 25 MG tablet Commonly known as: LOPRESSOR Take 1 tablet (25 mg total) by mouth 2 (two) times daily. What changed:   medication strength  how much to take  when to take this    pantoprazole 40 MG tablet Commonly known as: PROTONIX Take 1 tablet (40 mg total) by mouth 2 (two) times daily before a meal.   polyethylene glycol 17 g packet Commonly known as: MIRALAX / GLYCOLAX Take 17 g by mouth daily. Start taking on: December 30, 2019   sertraline 100 MG tablet Commonly known as: ZOLOFT Take 1 tablet (100 mg total) by mouth in the morning and at bedtime.   vitamin B-12 1000 MCG tablet Commonly known as: CYANOCOBALAMIN Take 1,000 mcg by mouth daily.       Follow-up Information    Derek Jack, MD. Schedule an appointment as soon as possible for a visit in 2 week(s).   Specialty: Hematology Why: Establish care for anal cancer Contact information: [place] [address] [phone]              Allergies  Allergen Reactions  . Avandia [Rosiglitazone] Other (See Comments)    unk  . Niacin And Related Other (See  Comments)    unk   Allergies as of 12/29/2019      Reactions   Avandia [rosiglitazone] Other (See Comments)   unk   Niacin And Related Other (See Comments)   unk      Medication List    STOP taking these medications   hydrocortisone 2.5 % rectal cream Commonly known as: ANUSOL-HC   lamoTRIgine 100 MG tablet Commonly known as: LAMICTAL     TAKE these medications   aspirin EC 81 MG tablet Take 1 tablet (81 mg total) by mouth daily.   atorvastatin 40 MG tablet Commonly known as: LIPITOR Take 1 tablet (40 mg total) by mouth daily.   CENTRUM SILVER PO Take by mouth.   clopidogrel 75 MG tablet Commonly known as: PLAVIX Take 1 tablet (75 mg total) by mouth daily.   fenofibrate 145 MG tablet Commonly known as: TRICOR Take 1 tablet (145 mg total) by mouth daily.   gabapentin 800 MG tablet Commonly known as: NEURONTIN Take 1 tablet (800 mg total) by mouth 3 (three) times daily.   hydrocortisone 25 MG suppository Commonly known as: ANUSOL-HC Place 1 suppository (25 mg total) rectally 2 (two)  times daily.   insulin aspart 100 UNIT/ML injection Commonly known as: novoLOG Inject 3 Units into the skin 3 (three) times daily with meals.   insulin aspart 100 UNIT/ML injection Commonly known as: novoLOG CBG 70 - 120: 0 units CBG 121 - 150: 2 units CBG 151 - 200: 3 units CBG 201 - 250: 5 units CBG 251 - 300: 8 units CBG 301 - 350: 11 units CBG 351 - 400: 15 units   insulin glargine 100 UNIT/ML injection Commonly known as: LANTUS Inject 0.6 mLs (60 Units total) into the skin at bedtime.   metFORMIN 500 MG tablet Commonly known as: GLUCOPHAGE Take 1 tablet (500 mg total) by mouth 2 (two) times daily with a meal.   metoprolol tartrate 25 MG tablet Commonly known as: LOPRESSOR Take 1 tablet (25 mg total) by mouth 2 (two) times daily. What changed:   medication strength  how much to take  when to take this   pantoprazole 40 MG tablet Commonly known as: PROTONIX Take 1 tablet (40 mg total) by mouth 2 (two) times daily before a meal.   polyethylene glycol 17 g packet Commonly known as: MIRALAX / GLYCOLAX Take 17 g by mouth daily. Start taking on: December 30, 2019   sertraline 100 MG tablet Commonly known as: ZOLOFT Take 1 tablet (100 mg total) by mouth in the morning and at bedtime.   vitamin B-12 1000 MCG tablet Commonly known as: CYANOCOBALAMIN Take 1,000 mcg by mouth daily.       Procedures/Studies: CT CHEST W CONTRAST  Result Date: 12/28/2019 CLINICAL DATA:  Rectal mass suspicious for malignancy. Colorectal cancer staging. EXAM: CT CHEST WITH CONTRAST TECHNIQUE: Multidetector CT imaging of the chest was performed during intravenous contrast administration. CONTRAST:  34mL OMNIPAQUE IOHEXOL 300 MG/ML  SOLN COMPARISON:  Abdominopelvic CT 12/26/2019. Chest radiographs 11/15/2019. FINDINGS: Cardiovascular: No acute vascular findings. Mild aortic and coronary artery atherosclerosis with probable coronary artery stents. Probable previous mini median sternotomy.  There is mild central enlargement of the pulmonary arteries suspicious for pulmonary arterial hypertension. The heart size is normal. There is no pericardial effusion. Mediastinum/Nodes: There are no enlarged mediastinal, hilar or axillary lymph nodes. Mild dilatation of the distal esophagus with associated wall thickening. The trachea and thyroid gland demonstrate no significant findings. Lungs/Pleura: There is no  pleural effusion. There is scattered linear scarring in both lungs, left greater than right. There is a small calcified left lower lobe granuloma. No suspicious pulmonary nodules. Upper abdomen: The visualized upper abdomen appears stable without acute findings. Musculoskeletal/Chest wall: There is no chest wall mass or suspicious osseous finding. Postsurgical changes in the sternum and upper abdominal wall. IMPRESSION: 1. No evidence of metastatic disease within the chest. 2. Mild central enlargement of the pulmonary arteries suspicious for pulmonary arterial hypertension. 3. Mild dilatation of the distal esophagus with associated wall thickening. 4. Aortic Atherosclerosis (ICD10-I70.0). Electronically Signed   By: Richardean Sale M.D.   On: 12/28/2019 15:06   CT ABDOMEN PELVIS W CONTRAST  Result Date: 12/26/2019 CLINICAL DATA:  Lower abdominal pain and heme-positive stool EXAM: CT ABDOMEN AND PELVIS WITH CONTRAST TECHNIQUE: Multidetector CT imaging of the abdomen and pelvis was performed using the standard protocol following bolus administration of intravenous contrast. CONTRAST:  1100mL OMNIPAQUE IOHEXOL 300 MG/ML  SOLN COMPARISON:  None. FINDINGS: Lower chest: There is mild bibasilar atelectasis. There is no lung base edema or consolidation. Hepatobiliary: No apparent focal liver lesions. Gallbladder is absent. Borderline intrahepatic biliary duct dilatation is likely a consequence of post cholecystectomy state. Common hepatic duct is prominent measuring 12 mm with tapering of the common bile duct  distally. No biliary duct mass or calculus is evident on CT examination. Pancreas: No pancreatic mass or inflammatory focus. Pancreatic duct upper normal in size. Spleen: There is a 5 mm apparent hemangioma in the spleen anteriorly. No other splenic lesions are evident. Adrenals/Urinary Tract: Adrenals bilaterally appear normal. There is no appreciable renal mass or hydronephrosis on either side. No evident renal or ureteral calculus on either side. The urinary bladder is midline with wall thickness within normal limits. Stomach/Bowel: There is moderate stool in the colon. There is no appreciable diverticular disease. No bowel wall or mesenteric thickening. There is a focal ventral hernia slightly to the left of midline at the level of the superior aspect of the iliac crests. This hernia contains fat as well as a loop of transverse colon. No bowel compromise evident. This hernia measures 3.6 cm from right to left dimension at its neck and 2.0 cm from superior to inferior dimension at its neck. A second ventral hernia containing a loop of colon as well as fat is noted in the mid abdominal region. There is evidence of previous hernia repair in this area. No bowel compromise evident in this region. There is generalized rectus muscle thinning in this area. Elsewhere, there is no bowel wall thickening or bowel obstruction. Terminal ileum appears unremarkable. There is no evident free air or portal venous air. Vascular/Lymphatic: There is no abdominal aortic aneurysm. There is aortic and iliac artery atherosclerotic calcification. Major venous structures appear patent. There is no evident adenopathy in the abdomen or pelvis. Reproductive: Uterus is anteverted. Uterus appears somewhat atrophic. No adnexal masses. Other: Appendix appears normal. Ventral hernias noted above. No abscess or ascites evident in the abdomen or pelvis. Musculoskeletal: There is degenerative change in the lumbar spine. There are no blastic or lytic  bone lesions. No intramuscular lesions are evident. IMPRESSION: 1. Midline ventral hernia with second ventral hernia slightly more inferiorly slightly to the left of midline, both containing loops of transverse colon as well as fat. No bowel compromise. Postoperative change at the site of the midline ventral hernia. 2. Moderate stool in colon. No bowel wall thickening or bowel obstruction. No evident diverticular disease. 3.  Appendix  appears normal.  No abscess in the abdomen or pelvis. 4. Gallbladder absent. Mild intrahepatic duct dilatation and proximal common hepatic duct prominence, likely consequences of post cholecystectomy state. 5.  Aortic Atherosclerosis (ICD10-I70.0). Electronically Signed   By: Lowella Grip III M.D.   On: 12/26/2019 10:11   DG Foot Complete Left  Result Date: 12/26/2019 CLINICAL DATA:  Status post left great toe amputation and subsequent swelling in the area. EXAM: LEFT FOOT - COMPLETE 3+ VIEW COMPARISON:  November 15, 2019 FINDINGS: There is prior surgical amputation of the left great toe. There is no evidence of acute fracture or dislocation. There is no evidence of significant arthropathy. A small plantar calcaneal spur is noted. Mild to moderate severity soft tissue swelling is seen along the site of prior left great toe amputation no soft tissue air is seen. IMPRESSION: 1. Prior surgical amputation of the left great toe with soft tissue swelling seen at the surgical site. Electronically Signed   By: Virgina Norfolk M.D.   On: 12/26/2019 01:20      Subjective: Patient without complaints reports that the suppositories have helped her symptoms of anal pain and discomfort  Discharge Exam: Vitals:   12/29/19 0522 12/29/19 0928  BP: 126/68   Pulse: (!) 58 74  Resp: 18   Temp: 97.9 F (36.6 C)   SpO2: 99%    Vitals:   12/28/19 1145 12/28/19 2132 12/29/19 0522 12/29/19 0928  BP: 136/68 124/72 126/68   Pulse: 70 71 (!) 58 74  Resp: 18 17 18    Temp: 97.8 F  (36.6 C) 98.8 F (37.1 C) 97.9 F (36.6 C)   TempSrc: Oral Oral Oral   SpO2: 98% 97% 99%   Weight:      Height:        General: Pt is alert, awake, not in acute distress Cardiovascular: RRR, S1/S2 +, no rubs, no gallops Respiratory: CTA bilaterally, no wheezing, no rhonchi Abdominal: Soft, NT, ND, bowel sounds + Extremities: no edema, no cyanosis   The results of significant diagnostics from this hospitalization (including imaging, microbiology, ancillary and laboratory) are listed below for reference.     Microbiology: Recent Results (from the past 240 hour(s))  Resp Panel by RT-PCR (Flu A&B, Covid) Nasopharyngeal Swab     Status: None   Collection Time: 12/26/19  1:49 AM   Specimen: Nasopharyngeal Swab; Nasopharyngeal(NP) swabs in vial transport medium  Result Value Ref Range Status   SARS Coronavirus 2 by RT PCR NEGATIVE NEGATIVE Final    Comment: (NOTE) SARS-CoV-2 target nucleic acids are NOT DETECTED.  The SARS-CoV-2 RNA is generally detectable in upper respiratory specimens during the acute phase of infection. The lowest concentration of SARS-CoV-2 viral copies this assay can detect is 138 copies/mL. A negative result does not preclude SARS-Cov-2 infection and should not be used as the sole basis for treatment or other patient management decisions. A negative result may occur with  improper specimen collection/handling, submission of specimen other than nasopharyngeal swab, presence of viral mutation(s) within the areas targeted by this assay, and inadequate number of viral copies(<138 copies/mL). A negative result must be combined with clinical observations, patient history, and epidemiological information. The expected result is Negative.  Fact Sheet for Patients:  EntrepreneurPulse.com.au  Fact Sheet for Healthcare Providers:  IncredibleEmployment.be  This test is no t yet approved or cleared by the [place] FDA and   has been authorized for detection and/or diagnosis of SARS-CoV-2 by FDA under an Emergency Use Authorization (EUA).  This EUA will remain  in effect (meaning this test can be used) for the duration of the COVID-19 declaration under Section 564(b)(1) of the Act, 21 U.S.C.section 360bbb-3(b)(1), unless the authorization is terminated  or revoked sooner.       Influenza A by PCR NEGATIVE NEGATIVE Final   Influenza B by PCR NEGATIVE NEGATIVE Final    Comment: (NOTE) The Xpert Xpress SARS-CoV-2/FLU/RSV plus assay is intended as an aid in the diagnosis of influenza from Nasopharyngeal swab specimens and should not be used as a sole basis for treatment. Nasal washings and aspirates are unacceptable for Xpert Xpress SARS-CoV-2/FLU/RSV testing.  Fact Sheet for Patients: EntrepreneurPulse.com.au  Fact Sheet for Healthcare Providers: IncredibleEmployment.be  This test is not yet approved or cleared by the [place] FDA and has been authorized for detection and/or diagnosis of SARS-CoV-2 by FDA under an Emergency Use Authorization (EUA). This EUA will remain in effect (meaning this test can be used) for the duration of the COVID-19 declaration under Section 564(b)(1) of the Act, 21 U.S.C. section 360bbb-3(b)(1), unless the authorization is terminated or revoked.  Performed at [facility], [address] [place], [place] [address]      Labs: BNP (last 3 results) No results for input(s): BNP in the last 8760 hours. Basic Metabolic Panel: Recent Labs  Lab 12/26/19 0710 12/26/19 0841 12/27/19 0527 12/28/19 1017 12/29/19 0410  NA 122*  134* 135 135 134* 136  K 4.6  3.8 3.6 4.3 4.3 3.8  CL 85*  101 101 106 101 104  CO2 27  23 26 23 25 23   GLUCOSE 837*  254* 170* 223* 359* 242*  BUN 23  18 17 10 8 8   CREATININE 1.02*  0.72 0.69 0.57 0.71 0.75  CALCIUM 9.2  9.1 8.7* 8.5* 8.3* 8.5*  MG  --   --  1.5* 1.7 1.7   Liver Function  Tests: Recent Labs  Lab 12/26/19 0058 12/29/19 0410  AST 13* 14*  ALT 14 11  ALKPHOS 96 61  BILITOT 0.4 0.5  PROT 6.9 5.7*  ALBUMIN 3.6 2.9*   No results for input(s): LIPASE, AMYLASE in the last 168 hours. No results for input(s): AMMONIA in the last 168 hours. CBC: Recent Labs  Lab 12/26/19 0058 12/26/19 0710 12/27/19 0527 12/29/19 0410  WBC 9.0 9.0 6.4 7.3  HGB 12.6 13.9  13.8 11.4* 11.5*  HCT 37.0 41.6  41.7 34.9* 34.7*  MCV 90.7 93.1 92.8 93.5  PLT 275 271 250 252   Cardiac Enzymes: No results for input(s): CKTOTAL, CKMB, CKMBINDEX, TROPONINI in the last 168 hours. BNP: Invalid input(s): POCBNP CBG: Recent Labs  Lab 12/28/19 1119 12/28/19 1617 12/28/19 2131 12/29/19 0743 12/29/19 1117  GLUCAP 288* 188* 201* 257* 316*   D-Dimer No results for input(s): DDIMER in the last 72 hours. Hgb A1c No results for input(s): HGBA1C in the last 72 hours. Lipid Profile No results for input(s): CHOL, HDL, LDLCALC, TRIG, CHOLHDL, LDLDIRECT in the last 72 hours. Thyroid function studies No results for input(s): TSH, T4TOTAL, T3FREE, THYROIDAB in the last 72 hours.  Invalid input(s): FREET3 Anemia work up No results for input(s): VITAMINB12, FOLATE, FERRITIN, TIBC, IRON, RETICCTPCT in the last 72 hours. Urinalysis    Component Value Date/Time   COLORURINE STRAW (A) 12/26/2019 0008   APPEARANCEUR CLEAR 12/26/2019 0008   LABSPEC 1.024 12/26/2019 0008   PHURINE 7.0 12/26/2019 0008   GLUCOSEU >=500 (A) 12/26/2019 0008   HGBUR LARGE (A) 12/26/2019 0008   BILIRUBINUR  NEGATIVE 12/26/2019 0008   KETONESUR NEGATIVE 12/26/2019 0008   PROTEINUR NEGATIVE 12/26/2019 0008   NITRITE NEGATIVE 12/26/2019 0008   LEUKOCYTESUR TRACE (A) 12/26/2019 0008   Sepsis Labs Invalid input(s): PROCALCITONIN,  WBC,  LACTICIDVEN Microbiology Recent Results (from the past 240 hour(s))  Resp Panel by RT-PCR (Flu A&B, Covid) Nasopharyngeal Swab     Status: None   Collection Time: 12/26/19   1:49 AM   Specimen: Nasopharyngeal Swab; Nasopharyngeal(NP) swabs in vial transport medium  Result Value Ref Range Status   SARS Coronavirus 2 by RT PCR NEGATIVE NEGATIVE Final    Comment: (NOTE) SARS-CoV-2 target nucleic acids are NOT DETECTED.  The SARS-CoV-2 RNA is generally detectable in upper respiratory specimens during the acute phase of infection. The lowest concentration of SARS-CoV-2 viral copies this assay can detect is 138 copies/mL. A negative result does not preclude SARS-Cov-2 infection and should not be used as the sole basis for treatment or other patient management decisions. A negative result may occur with  improper specimen collection/handling, submission of specimen other than nasopharyngeal swab, presence of viral mutation(s) within the areas targeted by this assay, and inadequate number of viral copies(<138 copies/mL). A negative result must be combined with clinical observations, patient history, and epidemiological information. The expected result is Negative.  Fact Sheet for Patients:  EntrepreneurPulse.com.au  Fact Sheet for Healthcare Providers:  IncredibleEmployment.be  This test is no t yet approved or cleared by the [place] FDA and  has been authorized for detection and/or diagnosis of SARS-CoV-2 by FDA under an Emergency Use Authorization (EUA). This EUA will remain  in effect (meaning this test can be used) for the duration of the COVID-19 declaration under Section 564(b)(1) of the Act, 21 U.S.C.section 360bbb-3(b)(1), unless the authorization is terminated  or revoked sooner.       Influenza A by PCR NEGATIVE NEGATIVE Final   Influenza B by PCR NEGATIVE NEGATIVE Final    Comment: (NOTE) The Xpert Xpress SARS-CoV-2/FLU/RSV plus assay is intended as an aid in the diagnosis of influenza from Nasopharyngeal swab specimens and should not be used as a sole basis for treatment. Nasal washings and aspirates  are unacceptable for Xpert Xpress SARS-CoV-2/FLU/RSV testing.  Fact Sheet for Patients: EntrepreneurPulse.com.au  Fact Sheet for Healthcare Providers: IncredibleEmployment.be  This test is not yet approved or cleared by the [place] FDA and has been authorized for detection and/or diagnosis of SARS-CoV-2 by FDA under an Emergency Use Authorization (EUA). This EUA will remain in effect (meaning this test can be used) for the duration of the COVID-19 declaration under Section 564(b)(1) of the Act, 21 U.S.C. section 360bbb-3(b)(1), unless the authorization is terminated or revoked.  Performed at [facility], [address], [place], [place] [address]     Time coordinating discharge: 38 minutes   SIGNED:  Irwin Brakeman, MD  [facility] 12/29/2019, 2:13 PM How to contact the [facility] Attending or Consulting provider [place] or covering provider during after hours [place], for this patient?  1. Check the care team in [facility] and look for a) attending/consulting [facility] provider listed and b) the [facility] team listed 2. Log into [email or web address] and use [place]'s universal password to access. If you do not have the password, please contact the hospital operator. 3. Locate the [facility] provider you are looking for under [facility] and page to a number that you can be directly reached. 4. If you still have difficulty reaching the provider, please page the [facility] (Director on  Call) for the Hospitalists listed on amion for assistance.

## 2020-01-02 ENCOUNTER — Other Ambulatory Visit: Payer: Self-pay | Admitting: Family Medicine

## 2020-01-02 MED ORDER — FENOFIBRATE 145 MG PO TABS
145.0000 mg | ORAL_TABLET | Freq: Every day | ORAL | 1 refills | Status: AC
Start: 2020-01-02 — End: ?

## 2020-01-02 MED ORDER — INSULIN ASPART 100 UNIT/ML ~~LOC~~ SOLN: 3.0000 [IU] | Freq: Three times a day (TID) | SUBCUTANEOUS | 11 refills | Status: AC

## 2020-01-02 MED ORDER — HYDROCORTISONE ACETATE 25 MG RE SUPP
25.0000 mg | Freq: Two times a day (BID) | RECTAL | 1 refills | Status: AC
Start: 2020-01-02 — End: 2020-02-16

## 2020-01-02 MED ORDER — SERTRALINE HCL 100 MG PO TABS: 100.0000 mg | ORAL_TABLET | Freq: Two times a day (BID) | ORAL | 1 refills | Status: AC

## 2020-01-02 MED ORDER — POLYETHYLENE GLYCOL 3350 17 G PO PACK: 17.0000 g | PACK | Freq: Every day | ORAL | 1 refills | Status: AC

## 2020-01-02 MED ORDER — METOPROLOL TARTRATE 25 MG PO TABS: 25.0000 mg | ORAL_TABLET | Freq: Two times a day (BID) | ORAL | 1 refills | Status: AC

## 2020-01-02 MED ORDER — INSULIN GLARGINE 100 UNIT/ML ~~LOC~~ SOLN: 60.0000 [IU] | Freq: Every day | SUBCUTANEOUS | 0 refills | Status: AC

## 2020-01-02 MED ORDER — GABAPENTIN 800 MG PO TABS: 800.0000 mg | ORAL_TABLET | Freq: Three times a day (TID) | ORAL | 0 refills | Status: AC

## 2020-01-02 MED ORDER — ATORVASTATIN CALCIUM 40 MG PO TABS
40.0000 mg | ORAL_TABLET | Freq: Every day | ORAL | 0 refills | Status: AC
Start: 2020-01-02 — End: ?

## 2020-01-02 MED ORDER — PANTOPRAZOLE SODIUM 40 MG PO TBEC: 40.0000 mg | DELAYED_RELEASE_TABLET | Freq: Two times a day (BID) | ORAL | 1 refills | Status: AC

## 2020-01-02 MED ORDER — CLOPIDOGREL BISULFATE 75 MG PO TABS: 75.0000 mg | ORAL_TABLET | Freq: Every day | ORAL | 1 refills | Status: AC

## 2020-01-02 MED ORDER — METFORMIN HCL 500 MG PO TABS: 500.0000 mg | ORAL_TABLET | Freq: Two times a day (BID) | ORAL | 1 refills | Status: AC

## 2020-01-15 ENCOUNTER — Encounter (HOSPITAL_COMMUNITY): Payer: Self-pay | Admitting: Internal Medicine

## 2020-01-17 NOTE — Progress Notes (Shared)
[address] [place], [place] [address]   CLINIC:  Medical Oncology/Hematology  CONSULT NOTE  Patient Care Team: Patient, No Pcp Per as PCP - General (General Practice)  CHIEF COMPLAINTS/PURPOSE OF CONSULTATION:  Evaluation of rectal cancer  HISTORY OF PRESENTING ILLNESS:  Pamela Knight 60 y.o. female is here because of evaluation of rectal cancer, at the request of Dr. Hildred Laser.  Today she reports  MEDICAL HISTORY:  Past Medical History:  Diagnosis Date  . Diabetes mellitus, type II ([place])   . Heart attack ([place])   . Heart failure ([place])   . Hyperlipidemia   . Hypertension   . Kidney disease     SURGICAL HISTORY: Past Surgical History:  Procedure Laterality Date  . AMPUTATION Left 11/17/2019   Procedure: AMPUTATION LEFT GREAT TOE;  Surgeon: Erle Crocker, MD;  Location: [facility] OR[facility];  Service: Orthopedics;  Laterality: Left;  . COLONOSCOPY WITH PROPOFOL N/A 12/27/2019   Procedure: COLONOSCOPY WITH PROPOFOL;  Surgeon: Rogene Houston, MD;  Location: [facility] SU[facility];  Service: Endoscopy;  Laterality: N/A;  . POLYPECTOMY  12/27/2019   Procedure: POLYPECTOMY;  Surgeon: Rogene Houston, MD;  Location: [facility];  Service: Endoscopy;;  . TONSILLECTOMY      SOCIAL HISTORY: Social History   Socioeconomic History  . Marital status: Divorced    Spouse name: Not on file  . Number of children: Not on file  . Years of education: Not on file  . Highest education level: Not on file  Occupational History  . Not on file  Tobacco Use  . Smoking status: Former Research scientist (life sciences)  . Smokeless tobacco: Never Used  Substance and Sexual Activity  . Alcohol use: No  . Drug use: No  . Sexual activity: Not on file  Other Topics Concern  . Not on file  Social History Narrative  . Not on file   Social Determinants of Health   Financial Resource Strain: Not on file  Food Insecurity: Not on file  Transportation Needs: Not on file  Physical  Activity: Not on file  Stress: Not on file  Social Connections: Not on file  Intimate Partner Violence: Not on file    FAMILY HISTORY: Family History  Problem Relation Age of Onset  . Diabetes Mother   . Hypertension Mother   . Heart failure Mother   . Kidney disease Mother   . Heart attack Mother   . Diabetes Father   . Hypertension Father   . Heart failure Father   . Kidney disease Father   . Heart attack Father   . Diabetes Sister   . Hypertension Sister   . Heart failure Sister   . Kidney disease Sister   . Heart attack Sister   . Diabetes Brother   . Hypertension Brother   . Heart failure Brother   . Kidney disease Brother   . Heart attack Brother   . Colon cancer Neg Hx     ALLERGIES:  is allergic to avandia [rosiglitazone] and niacin and related.  MEDICATIONS:  Current Outpatient Medications  Medication Sig Dispense Refill  . aspirin EC 81 MG tablet Take 1 tablet (81 mg total) by mouth daily. 30 tablet 0  . atorvastatin (LIPITOR) 40 MG tablet Take 1 tablet (40 mg total) by mouth daily. 30 tablet 0  . clopidogrel (PLAVIX) 75 MG tablet Take 1 tablet (75 mg total) by mouth daily. 30 tablet 1  . fenofibrate (TRICOR) 145 MG  tablet Take 1 tablet (145 mg total) by mouth daily. 30 tablet 1  . gabapentin (NEURONTIN) 800 MG tablet Take 1 tablet (800 mg total) by mouth 3 (three) times daily. 90 tablet 0  . hydrocortisone (ANUSOL-HC) 25 MG suppository Place 1 suppository (25 mg total) rectally 2 (two) times daily. 60 suppository 1  . insulin aspart (NOVOLOG) 100 UNIT/ML injection CBG 70 - 120: 0 units CBG 121 - 150: 2 units CBG 151 - 200: 3 units CBG 201 - 250: 5 units CBG 251 - 300: 8 units CBG 301 - 350: 11 units CBG 351 - 400: 15 units 10 mL 11  . insulin aspart (NOVOLOG) 100 UNIT/ML injection Inject 3 Units into the skin 3 (three) times daily with meals. 10 mL 11  . insulin glargine (LANTUS) 100 UNIT/ML injection Inject 0.6 mLs (60 Units total) into the skin at  bedtime. 10 mL 0  . metFORMIN (GLUCOPHAGE) 500 MG tablet Take 1 tablet (500 mg total) by mouth 2 (two) times daily with a meal. 60 tablet 1  . metoprolol tartrate (LOPRESSOR) 25 MG tablet Take 1 tablet (25 mg total) by mouth 2 (two) times daily. 60 tablet 1  . Multiple Vitamins-Minerals (CENTRUM SILVER PO) Take by mouth.    . pantoprazole (PROTONIX) 40 MG tablet Take 1 tablet (40 mg total) by mouth 2 (two) times daily before a meal. 60 tablet 1  . polyethylene glycol (MIRALAX / GLYCOLAX) 17 g packet Take 17 g by mouth daily. 30 packet 1  . sertraline (ZOLOFT) 100 MG tablet Take 1 tablet (100 mg total) by mouth in the morning and at bedtime. 60 tablet 1  . vitamin B-12 (CYANOCOBALAMIN) 1000 MCG tablet Take 1,000 mcg by mouth daily.     No current facility-administered medications for this visit.    REVIEW OF SYSTEMS:   Review of Systems - Oncology   PHYSICAL EXAMINATION: ECOG PERFORMANCE STATUS: {CHL ONC ECOG PS:[phone]}  There were no vitals filed for this visit. There were no vitals filed for this visit. Physical Exam   LABORATORY DATA:  I have reviewed the data as listed CBC Latest Ref Rng & Units 12/29/2019 12/27/2019 12/26/2019  WBC 4.0 - 10.5 K/uL 7.3 6.4 9.0  Hemoglobin 12.0 - 15.0 g/dL 11.5(L) 11.4(L) 13.9  Hematocrit 36.0 - 46.0 % 34.7(L) 34.9(L) 41.6  Platelets 150 - 400 K/uL 252 250 271   CMP Latest Ref Rng & Units 12/29/2019 12/28/2019 12/27/2019  Glucose 70 - 99 mg/dL 242(H) 359(H) 223(H)  BUN 8 - 23 mg/dL 8 8 10   Creatinine 0.44 - 1.00 mg/dL 0.75 0.71 0.57  Sodium 135 - 145 mmol/L 136 134(L) 135  Potassium 3.5 - 5.1 mmol/L 3.8 4.3 4.3  Chloride 98 - 111 mmol/L 104 101 106  CO2 22 - 32 mmol/L 23 25 23   Calcium 8.9 - 10.3 mg/dL 8.5(L) 8.3(L) 8.5(L)  Total Protein 6.5 - 8.1 g/dL 5.7(L) - -  Total Bilirubin 0.3 - 1.2 mg/dL 0.5 - -  Alkaline Phos 38 - 126 U/L 61 - -  AST 15 - 41 U/L 14(L) - -  ALT 0 - 44 U/L 11 - -   Surgical pathology ([id number]) on  12/27/2019: Rectum, lesion biopsy: squamous cell carcinoma.  RADIOGRAPHIC STUDIES: I have personally reviewed the radiological images as listed and agreed with the findings in the report. CT CHEST W CONTRAST  Result Date: 12/28/2019 CLINICAL DATA:  Rectal mass suspicious for malignancy. Colorectal cancer staging. EXAM: CT CHEST WITH CONTRAST TECHNIQUE: Multidetector CT  imaging of the chest was performed during intravenous contrast administration. CONTRAST:  30mL OMNIPAQUE IOHEXOL 300 MG/ML  SOLN COMPARISON:  Abdominopelvic CT 12/26/2019. Chest radiographs 11/15/2019. FINDINGS: Cardiovascular: No acute vascular findings. Mild aortic and coronary artery atherosclerosis with probable coronary artery stents. Probable previous mini median sternotomy. There is mild central enlargement of the pulmonary arteries suspicious for pulmonary arterial hypertension. The heart size is normal. There is no pericardial effusion. Mediastinum/Nodes: There are no enlarged mediastinal, hilar or axillary lymph nodes. Mild dilatation of the distal esophagus with associated wall thickening. The trachea and thyroid gland demonstrate no significant findings. Lungs/Pleura: There is no pleural effusion. There is scattered linear scarring in both lungs, left greater than right. There is a small calcified left lower lobe granuloma. No suspicious pulmonary nodules. Upper abdomen: The visualized upper abdomen appears stable without acute findings. Musculoskeletal/Chest wall: There is no chest wall mass or suspicious osseous finding. Postsurgical changes in the sternum and upper abdominal wall. IMPRESSION: 1. No evidence of metastatic disease within the chest. 2. Mild central enlargement of the pulmonary arteries suspicious for pulmonary arterial hypertension. 3. Mild dilatation of the distal esophagus with associated wall thickening. 4. Aortic Atherosclerosis (ICD10-I70.0). Electronically Signed   By: Richardean Sale M.D.   On: 12/28/2019  15:06   CT ABDOMEN PELVIS W CONTRAST  Result Date: 12/26/2019 CLINICAL DATA:  Lower abdominal pain and heme-positive stool EXAM: CT ABDOMEN AND PELVIS WITH CONTRAST TECHNIQUE: Multidetector CT imaging of the abdomen and pelvis was performed using the standard protocol following bolus administration of intravenous contrast. CONTRAST:  145mL OMNIPAQUE IOHEXOL 300 MG/ML  SOLN COMPARISON:  None. FINDINGS: Lower chest: There is mild bibasilar atelectasis. There is no lung base edema or consolidation. Hepatobiliary: No apparent focal liver lesions. Gallbladder is absent. Borderline intrahepatic biliary duct dilatation is likely a consequence of post cholecystectomy state. Common hepatic duct is prominent measuring 12 mm with tapering of the common bile duct distally. No biliary duct mass or calculus is evident on CT examination. Pancreas: No pancreatic mass or inflammatory focus. Pancreatic duct upper normal in size. Spleen: There is a 5 mm apparent hemangioma in the spleen anteriorly. No other splenic lesions are evident. Adrenals/Urinary Tract: Adrenals bilaterally appear normal. There is no appreciable renal mass or hydronephrosis on either side. No evident renal or ureteral calculus on either side. The urinary bladder is midline with wall thickness within normal limits. Stomach/Bowel: There is moderate stool in the colon. There is no appreciable diverticular disease. No bowel wall or mesenteric thickening. There is a focal ventral hernia slightly to the left of midline at the level of the superior aspect of the iliac crests. This hernia contains fat as well as a loop of transverse colon. No bowel compromise evident. This hernia measures 3.6 cm from right to left dimension at its neck and 2.0 cm from superior to inferior dimension at its neck. A second ventral hernia containing a loop of colon as well as fat is noted in the mid abdominal region. There is evidence of previous hernia repair in this area. No bowel  compromise evident in this region. There is generalized rectus muscle thinning in this area. Elsewhere, there is no bowel wall thickening or bowel obstruction. Terminal ileum appears unremarkable. There is no evident free air or portal venous air. Vascular/Lymphatic: There is no abdominal aortic aneurysm. There is aortic and iliac artery atherosclerotic calcification. Major venous structures appear patent. There is no evident adenopathy in the abdomen or pelvis. Reproductive: Uterus is anteverted.  Uterus appears somewhat atrophic. No adnexal masses. Other: Appendix appears normal. Ventral hernias noted above. No abscess or ascites evident in the abdomen or pelvis. Musculoskeletal: There is degenerative change in the lumbar spine. There are no blastic or lytic bone lesions. No intramuscular lesions are evident. IMPRESSION: 1. Midline ventral hernia with second ventral hernia slightly more inferiorly slightly to the left of midline, both containing loops of transverse colon as well as fat. No bowel compromise. Postoperative change at the site of the midline ventral hernia. 2. Moderate stool in colon. No bowel wall thickening or bowel obstruction. No evident diverticular disease. 3.  Appendix appears normal.  No abscess in the abdomen or pelvis. 4. Gallbladder absent. Mild intrahepatic duct dilatation and proximal common hepatic duct prominence, likely consequences of post cholecystectomy state. 5.  Aortic Atherosclerosis (ICD10-I70.0). Electronically Signed   By: Lowella Grip III M.D.   On: 12/26/2019 10:11   DG Foot Complete Left  Result Date: 12/26/2019 CLINICAL DATA:  Status post left great toe amputation and subsequent swelling in the area. EXAM: LEFT FOOT - COMPLETE 3+ VIEW COMPARISON:  November 15, 2019 FINDINGS: There is prior surgical amputation of the left great toe. There is no evidence of acute fracture or dislocation. There is no evidence of significant arthropathy. A small plantar calcaneal spur  is noted. Mild to moderate severity soft tissue swelling is seen along the site of prior left great toe amputation no soft tissue air is seen. IMPRESSION: 1. Prior surgical amputation of the left great toe with soft tissue swelling seen at the surgical site. Electronically Signed   By: Virgina Norfolk M.D.   On: 12/26/2019 01:20    ASSESSMENT:  1.  Rectal squamous cell carcinoma: ***   PLAN:  1.  Rectal squamous cell carcinoma: ***   All questions were answered. The patient knows to call the clinic with any problems, questions or concerns. I spent {CHL ONC TIME VISIT - BOFBP:[id number]} counseling the patient face to face. The total time spent in the appointment was {CHL ONC TIME VISIT - EUMPN:[id number]} and more than 50% was on counseling.   Derek Jack, MD, 01/17/20 5:43 PM  [place] [phone]   I, Milinda Antis, am acting as a scribe for Dr. Sanda Linger.  {Add Barista Statement}

## 2020-01-18 ENCOUNTER — Inpatient Hospital Stay (HOSPITAL_COMMUNITY): Payer: Medicaid - Out of State | Attending: Hematology | Admitting: Hematology

## 2020-01-18 DIAGNOSIS — C20 Malignant neoplasm of rectum: Secondary | ICD-10-CM

## 2021-04-15 IMAGING — CT CT ABD-PELV W/ CM
2 of 5 series · 14 of 46 positions shown, 16 images · IV contrast (Omnipaque or Isovue)
Comparison: None.

CLINICAL DATA: Lower abdominal pain and heme-positive stool

EXAM:
CT ABDOMEN AND PELVIS WITH CONTRAST
TECHNIQUE: Multidetector CT imaging of the abdomen and pelvis was performed
using the standard protocol following bolus administration of
intravenous contrast.
CONTRAST:  100mL OMNIPAQUE IOHEXOL 300 MG/ML  SOLN

[Series 3: axial st · axial · 0.81mm/px · z∈[-906,-446]mm · 11 of 104 slices shown, 13 images]
[im 6/104  soft-tissue]
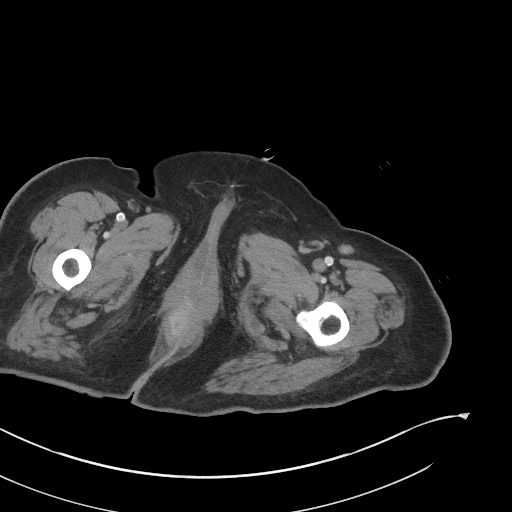
[im 6/104  bone]
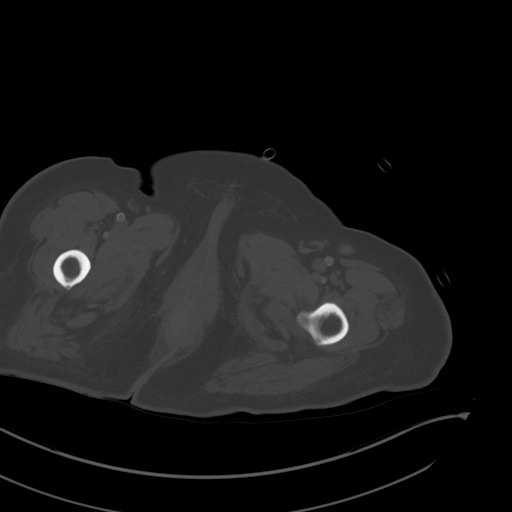
[im 17/104  soft-tissue]
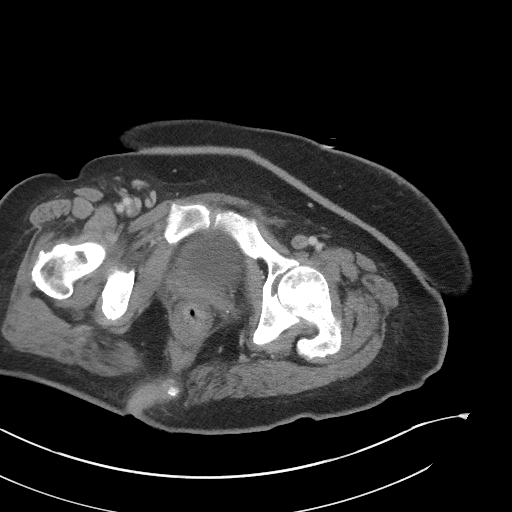
[im 28/104  soft-tissue]
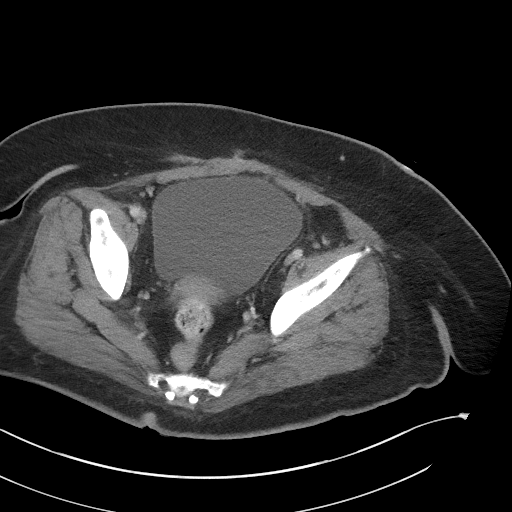
[im 33/104  soft-tissue]
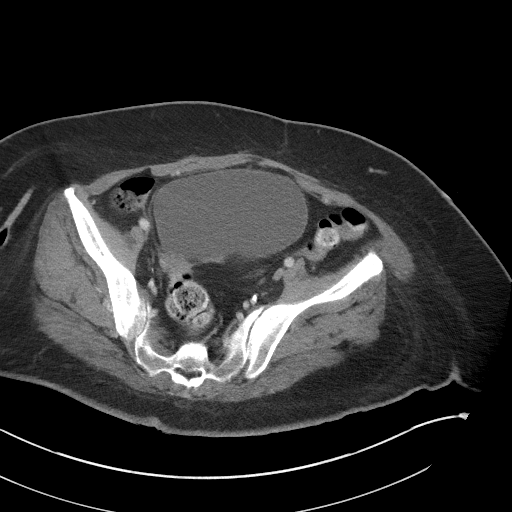
[im 44/104  soft-tissue]
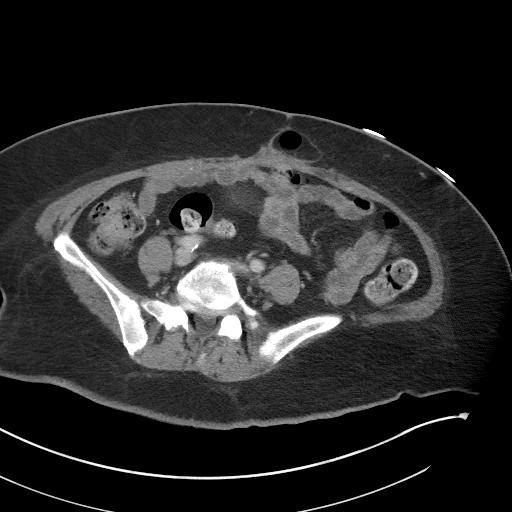
[im 55/104  soft-tissue]
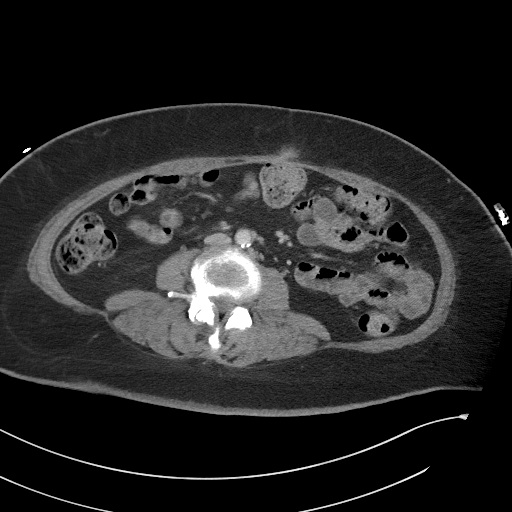
[im 60/104  soft-tissue]
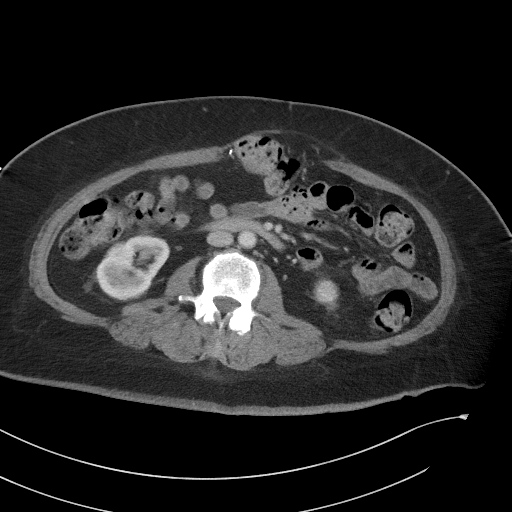
[im 71/104  soft-tissue]
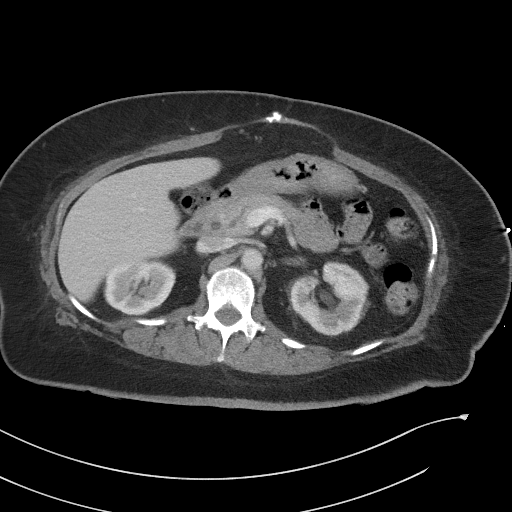
[im 76/104  soft-tissue]
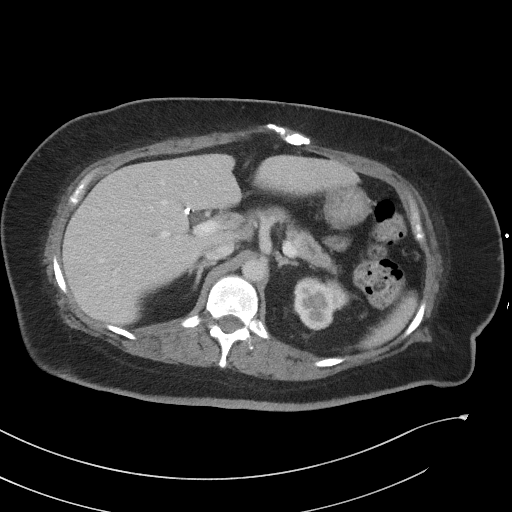
[im 76/104  bone]
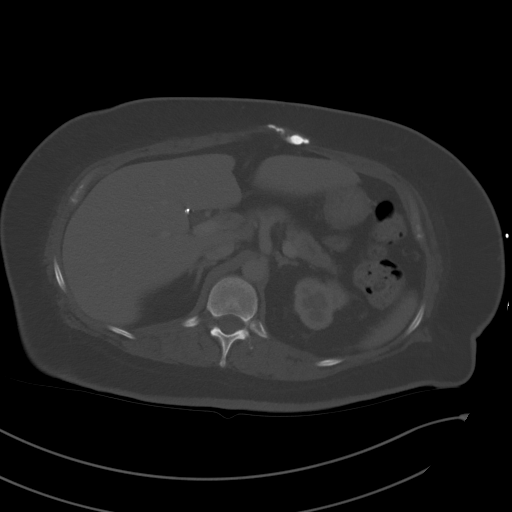
[im 87/104  soft-tissue]
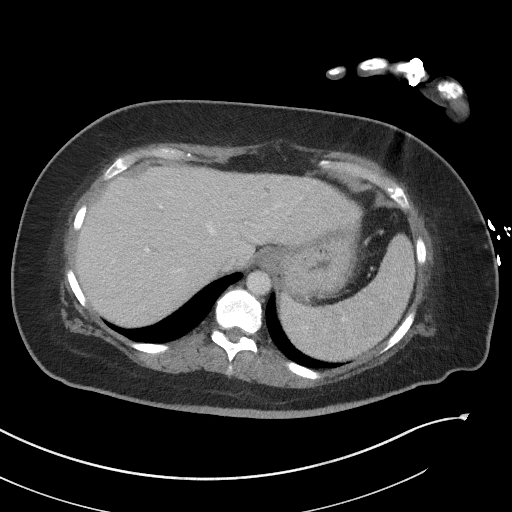
[im 98/104  soft-tissue]
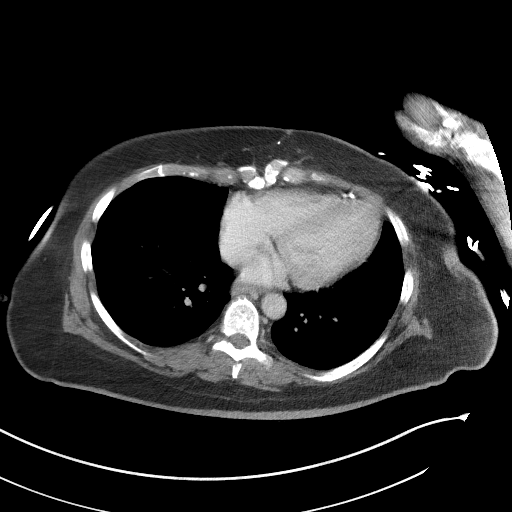

[Series 7: coronal st · coronal · 0.78mm/px · 3 of 112 slices shown]
[im 38/112  soft-tissue]
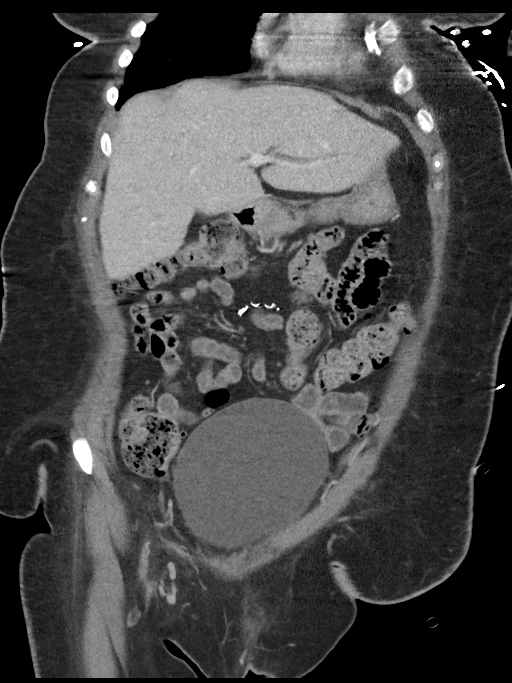
[im 50/112  soft-tissue]
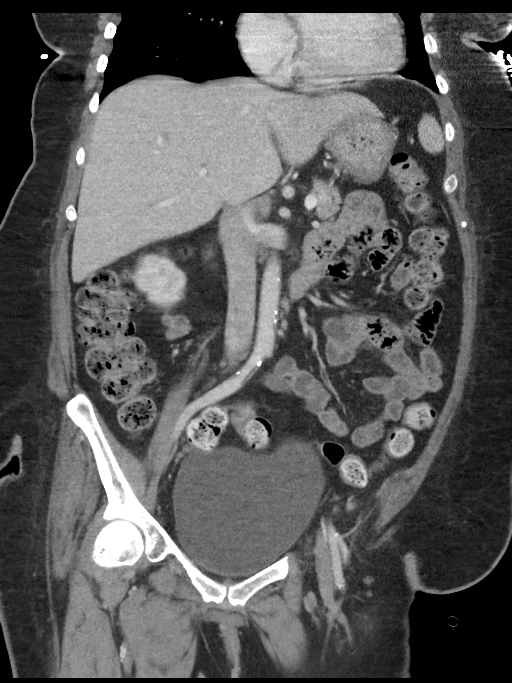
[im 62/112  soft-tissue]
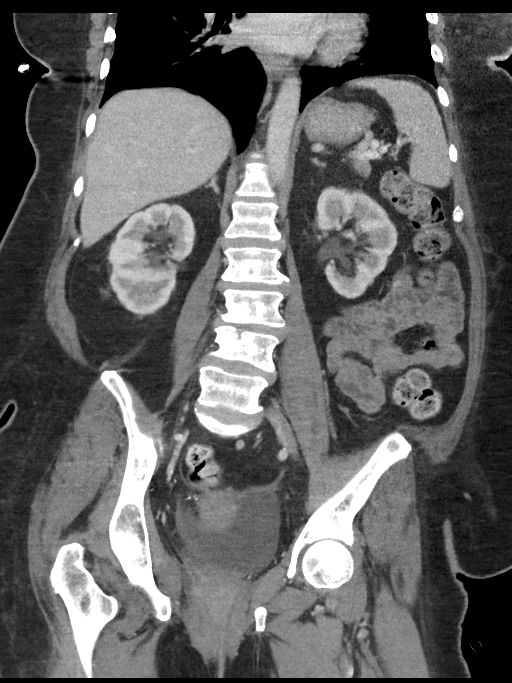

[14 of 46 positions shown; findings below may reference images not displayed]

FINDINGS: Lower chest: There is mild bibasilar atelectasis. There is no lung
base edema or consolidation.

Hepatobiliary: No apparent focal liver lesions. Gallbladder is
absent. Borderline intrahepatic biliary duct dilatation is likely a
consequence of post cholecystectomy state. Common hepatic duct is
prominent measuring 12 mm with tapering of the common bile duct
distally. No biliary duct mass or calculus is evident on CT
examination.

Pancreas: No pancreatic mass or inflammatory focus. Pancreatic duct
upper normal in size.

Spleen: There is a 5 mm apparent hemangioma in the spleen
anteriorly. No other splenic lesions are evident.

Adrenals/Urinary Tract: Adrenals bilaterally appear normal. There is
no appreciable renal mass or hydronephrosis on either side. No
evident renal or ureteral calculus on either side. The urinary
bladder is midline with wall thickness within normal limits.

Stomach/Bowel: There is moderate stool in the colon. There is no
appreciable diverticular disease. No bowel wall or mesenteric
thickening. There is a focal ventral hernia slightly to the left of
midline at the level of the superior aspect of the iliac crests.
This hernia contains fat as well as a loop of transverse colon. No
bowel compromise evident. This hernia measures 3.6 cm from right to
left dimension at its neck and 2.0 cm from superior to inferior
dimension at its neck. A second ventral hernia containing a loop of
colon as well as fat is noted in the mid abdominal region. There is
evidence of previous hernia repair in this area. No bowel compromise
evident in this region. There is generalized rectus muscle thinning
in this area. Elsewhere, there is no bowel wall thickening or bowel
obstruction. Terminal ileum appears unremarkable. There is no
evident free air or portal venous air.

Vascular/Lymphatic: There is no abdominal aortic aneurysm. There is
aortic and iliac artery atherosclerotic calcification. Major venous
structures appear patent. There is no evident adenopathy in the
abdomen or pelvis.

Reproductive: Uterus is anteverted. Uterus appears somewhat
atrophic. No adnexal masses.

Other: Appendix appears normal. Ventral hernias noted above. No
abscess or ascites evident in the abdomen or pelvis.

Musculoskeletal: There is degenerative change in the lumbar spine.
There are no blastic or lytic bone lesions. No intramuscular lesions
are evident.
IMPRESSION: 1. Midline ventral hernia with second ventral hernia slightly more
inferiorly slightly to the left of midline, both containing loops of
transverse colon as well as fat. No bowel compromise. Postoperative
change at the site of the midline ventral hernia.

2. Moderate stool in colon. No bowel wall thickening or bowel
obstruction. No evident diverticular disease.

3.  Appendix appears normal.  No abscess in the abdomen or pelvis.

4. Gallbladder absent. Mild intrahepatic duct dilatation and
proximal common hepatic duct prominence, likely consequences of post
cholecystectomy state.

5.  Aortic Atherosclerosis ([id number]-WI3.3).
